# Patient Record
Sex: Male | Born: 1960 | Race: Black or African American | Hispanic: No | Marital: Married | Smoking: Never smoker
Health system: Southern US, Community
[De-identification: ages and names within clinical notes are randomized; demographics above are authoritative.]

## PROBLEM LIST (undated history)

## (undated) DIAGNOSIS — Z86711 Personal history of pulmonary embolism: Secondary | ICD-10-CM

## (undated) DIAGNOSIS — I471 Supraventricular tachycardia, unspecified: Secondary | ICD-10-CM

## (undated) DIAGNOSIS — Z8709 Personal history of other diseases of the respiratory system: Secondary | ICD-10-CM

## (undated) DIAGNOSIS — Z86718 Personal history of other venous thrombosis and embolism: Secondary | ICD-10-CM

## (undated) DIAGNOSIS — R000 Tachycardia, unspecified: Secondary | ICD-10-CM

## (undated) DIAGNOSIS — E785 Hyperlipidemia, unspecified: Secondary | ICD-10-CM

## (undated) DIAGNOSIS — I10 Essential (primary) hypertension: Secondary | ICD-10-CM

## (undated) DIAGNOSIS — N401 Enlarged prostate with lower urinary tract symptoms: Secondary | ICD-10-CM

## (undated) DIAGNOSIS — I509 Heart failure, unspecified: Secondary | ICD-10-CM

## (undated) DIAGNOSIS — E119 Type 2 diabetes mellitus without complications: Secondary | ICD-10-CM

## (undated) DIAGNOSIS — F410 Panic disorder [episodic paroxysmal anxiety] without agoraphobia: Secondary | ICD-10-CM

## (undated) DIAGNOSIS — Z972 Presence of dental prosthetic device (complete) (partial): Secondary | ICD-10-CM

## (undated) DIAGNOSIS — C61 Malignant neoplasm of prostate: Secondary | ICD-10-CM

## (undated) HISTORY — DX: Type 2 diabetes mellitus without complications: E11.9

## (undated) HISTORY — DX: Personal history of other venous thrombosis and embolism: Z86.718

## (undated) HISTORY — PX: NO PAST SURGERIES: SHX2092

## (undated) HISTORY — DX: Essential (primary) hypertension: I10

## (undated) HISTORY — DX: Heart failure, unspecified: I50.9

## (undated) HISTORY — DX: Panic disorder (episodic paroxysmal anxiety): F41.0

## (undated) HISTORY — DX: Personal history of pulmonary embolism: Z86.711

## (undated) HISTORY — DX: Hyperlipidemia, unspecified: E78.5

---

## 2002-08-04 ENCOUNTER — Encounter: Admission: RE | Admit: 2002-08-04 | Discharge: 2002-11-02 | Payer: Self-pay | Admitting: Internal Medicine

## 2004-04-07 DIAGNOSIS — Z8679 Personal history of other diseases of the circulatory system: Secondary | ICD-10-CM

## 2004-04-07 DIAGNOSIS — Z86711 Personal history of pulmonary embolism: Secondary | ICD-10-CM

## 2004-04-07 HISTORY — DX: Personal history of pulmonary embolism: Z86.711

## 2004-04-07 HISTORY — DX: Personal history of other diseases of the circulatory system: Z86.79

## 2004-04-19 ENCOUNTER — Ambulatory Visit: Payer: Self-pay | Admitting: Internal Medicine

## 2004-04-21 ENCOUNTER — Encounter: Admission: RE | Admit: 2004-04-21 | Discharge: 2004-04-21 | Payer: Self-pay | Admitting: Internal Medicine

## 2004-04-21 ENCOUNTER — Emergency Department (HOSPITAL_COMMUNITY): Admission: EM | Admit: 2004-04-21 | Discharge: 2004-04-21 | Payer: Self-pay | Admitting: Emergency Medicine

## 2004-04-21 ENCOUNTER — Encounter: Payer: Self-pay | Admitting: Internal Medicine

## 2004-04-22 ENCOUNTER — Encounter: Admission: RE | Admit: 2004-04-22 | Discharge: 2004-04-22 | Payer: Self-pay | Admitting: Family Medicine

## 2004-04-22 ENCOUNTER — Ambulatory Visit: Payer: Self-pay | Admitting: Family Medicine

## 2004-04-23 ENCOUNTER — Ambulatory Visit: Payer: Self-pay | Admitting: Family Medicine

## 2004-04-25 ENCOUNTER — Ambulatory Visit: Payer: Self-pay | Admitting: Internal Medicine

## 2004-04-28 ENCOUNTER — Ambulatory Visit: Payer: Self-pay | Admitting: Internal Medicine

## 2004-05-06 ENCOUNTER — Ambulatory Visit: Payer: Self-pay | Admitting: Internal Medicine

## 2004-06-08 ENCOUNTER — Ambulatory Visit: Payer: Self-pay | Admitting: Internal Medicine

## 2004-06-29 ENCOUNTER — Ambulatory Visit: Payer: Self-pay

## 2004-06-29 ENCOUNTER — Encounter: Payer: Self-pay | Admitting: Internal Medicine

## 2004-07-06 ENCOUNTER — Ambulatory Visit: Payer: Self-pay | Admitting: Internal Medicine

## 2004-07-14 ENCOUNTER — Encounter: Payer: Self-pay | Admitting: Internal Medicine

## 2004-07-14 ENCOUNTER — Ambulatory Visit: Payer: Self-pay | Admitting: Cardiology

## 2004-07-27 ENCOUNTER — Ambulatory Visit: Payer: Self-pay

## 2004-08-03 ENCOUNTER — Encounter: Payer: Self-pay | Admitting: Internal Medicine

## 2004-08-03 ENCOUNTER — Ambulatory Visit: Payer: Self-pay | Admitting: Cardiology

## 2004-10-20 ENCOUNTER — Ambulatory Visit: Payer: Self-pay | Admitting: Internal Medicine

## 2005-04-06 ENCOUNTER — Ambulatory Visit: Payer: Self-pay | Admitting: Internal Medicine

## 2005-07-12 ENCOUNTER — Ambulatory Visit: Payer: Self-pay | Admitting: Internal Medicine

## 2005-07-27 ENCOUNTER — Encounter: Payer: Self-pay | Admitting: Internal Medicine

## 2005-07-27 ENCOUNTER — Ambulatory Visit: Payer: Self-pay

## 2005-07-27 ENCOUNTER — Encounter: Payer: Self-pay | Admitting: Cardiology

## 2005-09-07 ENCOUNTER — Ambulatory Visit: Payer: Self-pay | Admitting: Internal Medicine

## 2005-10-12 ENCOUNTER — Ambulatory Visit: Payer: Self-pay | Admitting: Internal Medicine

## 2006-02-12 ENCOUNTER — Ambulatory Visit: Payer: Self-pay | Admitting: Internal Medicine

## 2006-03-20 IMAGING — CT CT EXTREM LOW BILAT W/ CM
1 series · 16 of 32 positions shown, 20 images · IV contrast (omnipaque)
Comparison: none

CLINICAL DATA: Recent pulmonary emboli.
CT LOWER EXTREMITIES WITH CONTRAST ? DVT PROTOCOL ? 04/22/04: 
Scans of the pelvis and lower extremities were obtained with lower extremity DVT protocol following IV injection of 120 cc of Omnipaque 300 and delayed scanning.
There is relatively faint opacification of the venous system. However, there is no CT scan evidence for deep venous thrombosis.  The soft tissues have a symmetrical and normal appearance.  There is no evidence for a pelvic mass or adenopathy and scans of the abdomen included with the study demonstrate no retroperitoneal adenopathy or retroperitoneal mass.

[Series 2: recon 2 pulmonary arteries · axial · 0.73mm/px · z∈[-1162,-42]mm · 16 of 111 slices shown, 20 images]
[im 5/111  mediastinal]
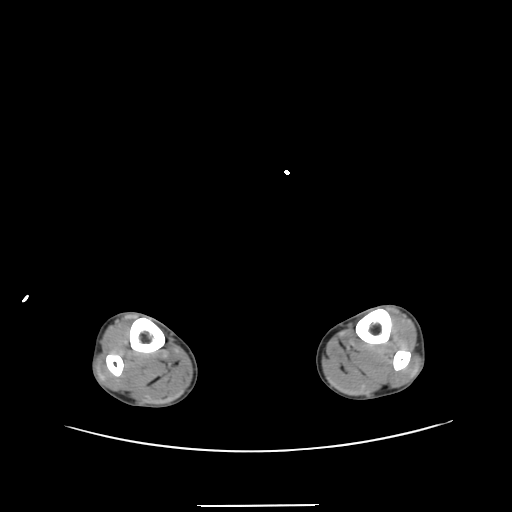
[im 5/111  lung]
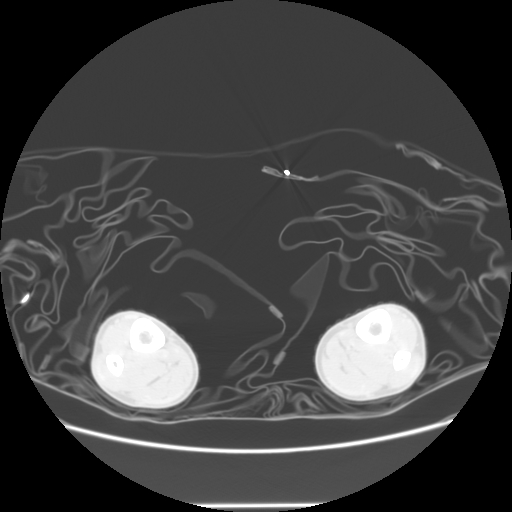
[im 13/111  lung]
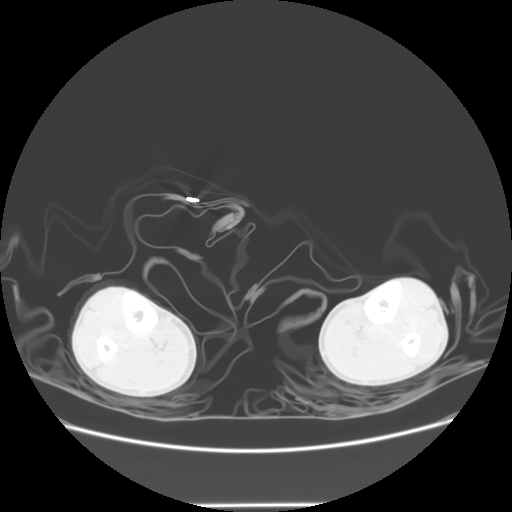
[im 21/111  lung]
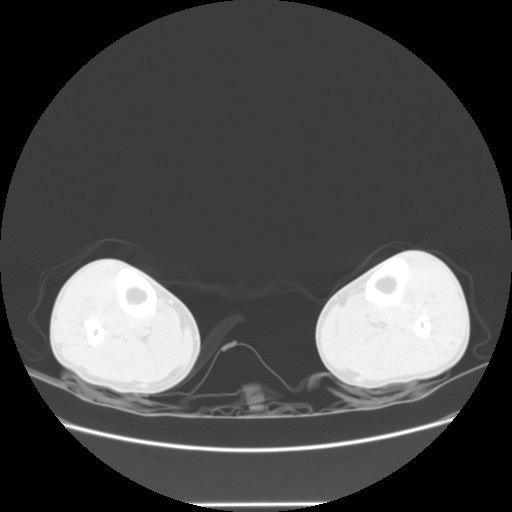
[im 25/111  lung]
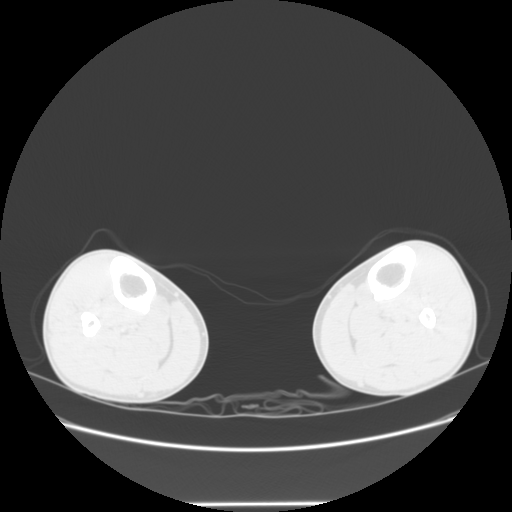
[im 33/111  mediastinal]
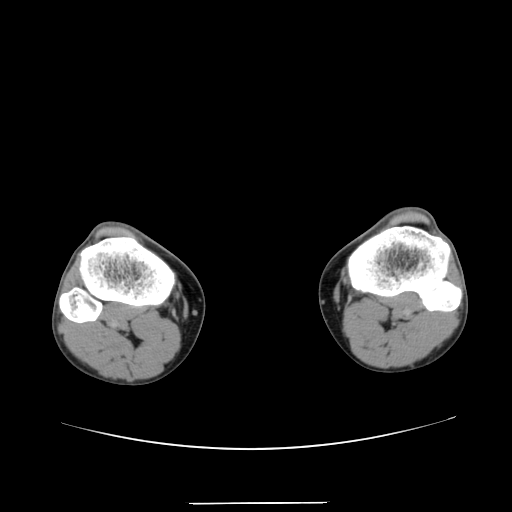
[im 33/111  lung]
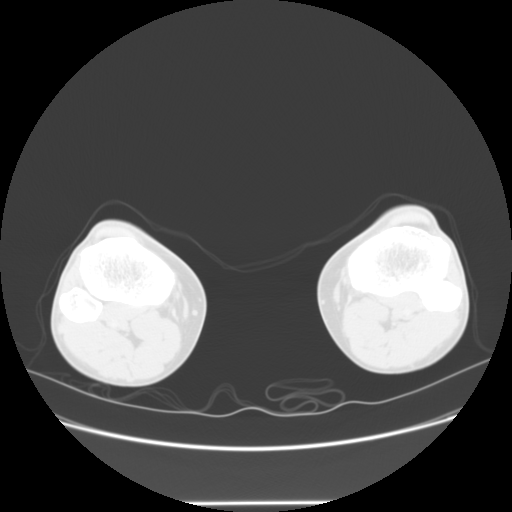
[im 41/111  lung]
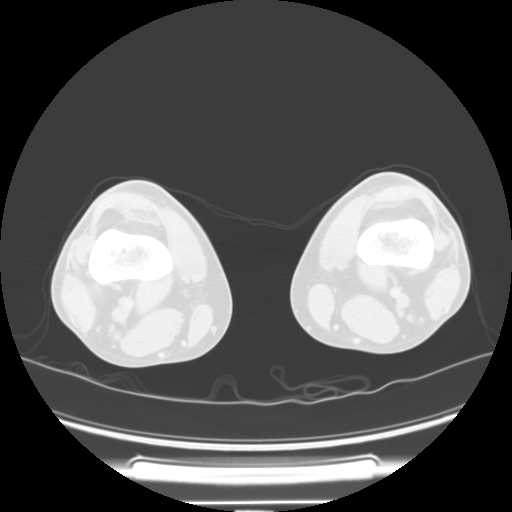
[im 49/111  lung]
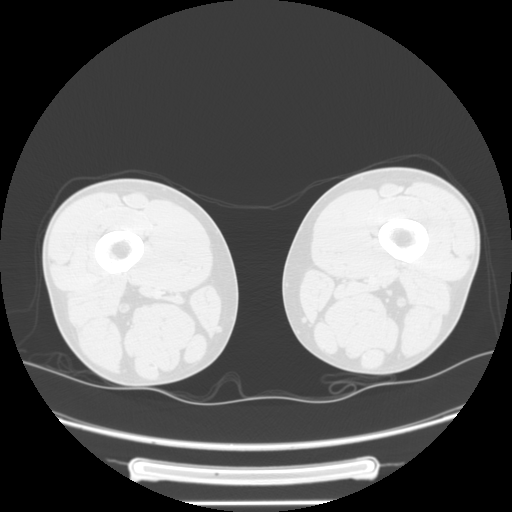
[im 58/111  lung]
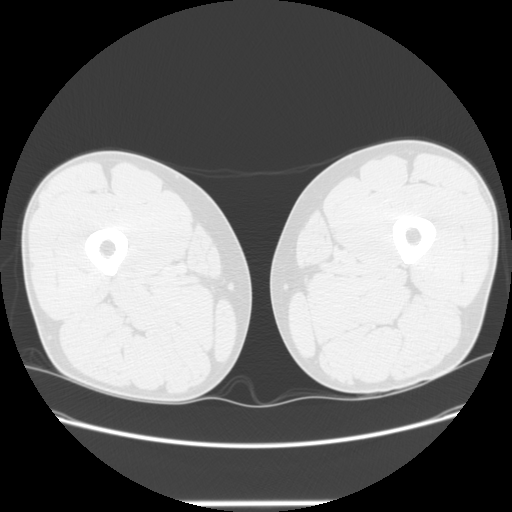
[im 61/111  mediastinal]
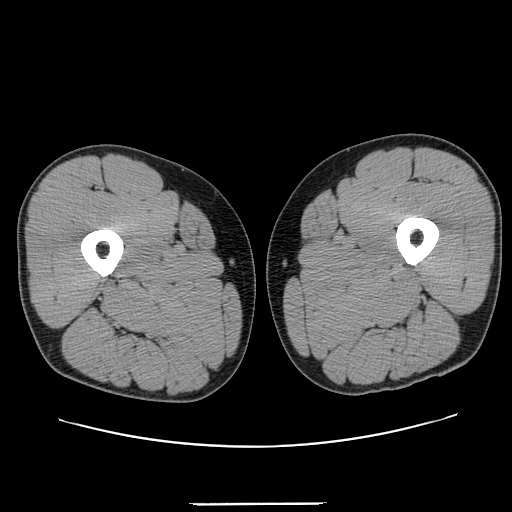
[im 61/111  lung]
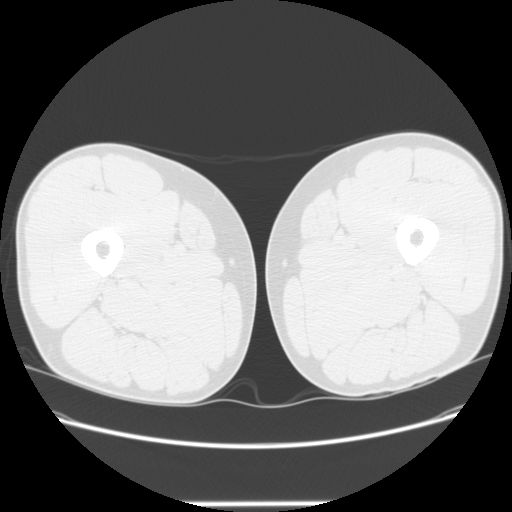
[im 66/111  lung]
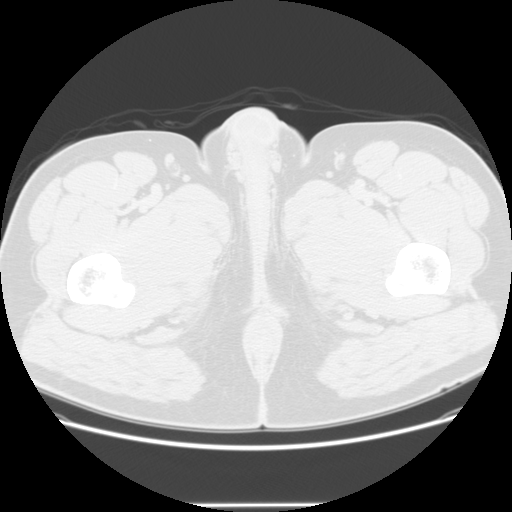
[im 70/111  lung]
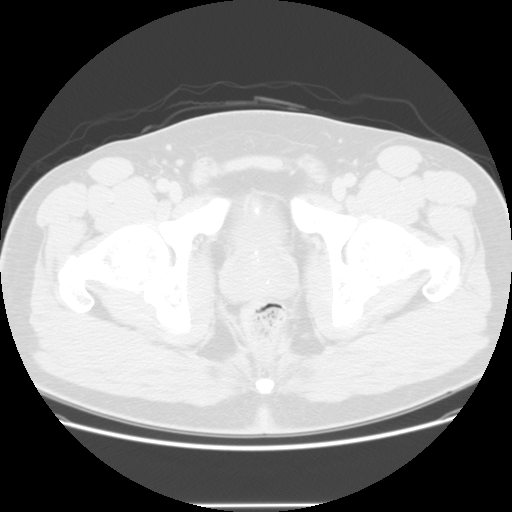
[im 78/111  lung]
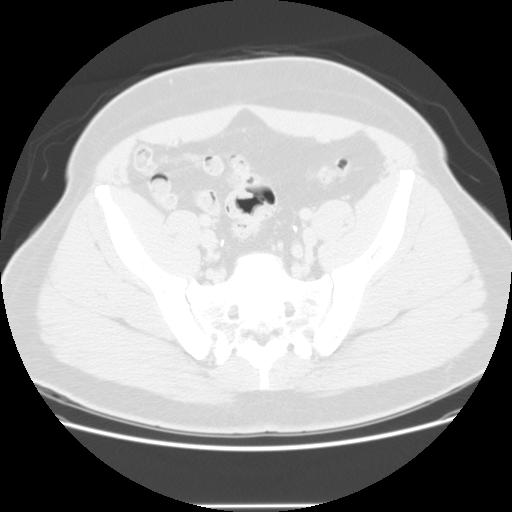
[im 86/111  mediastinal]
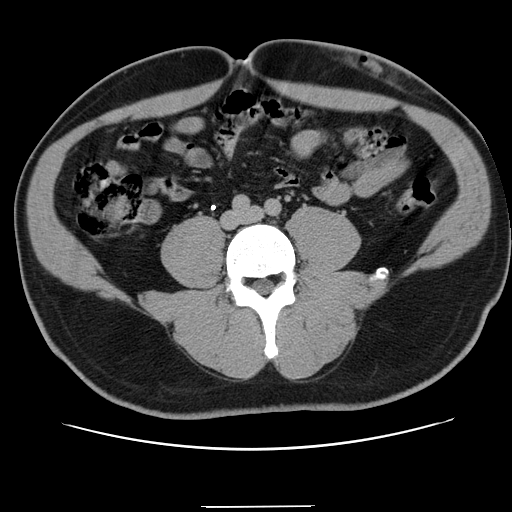
[im 86/111  lung]
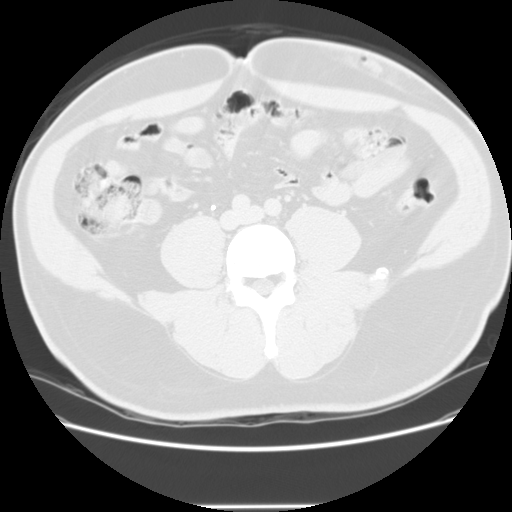
[im 90/111  lung]
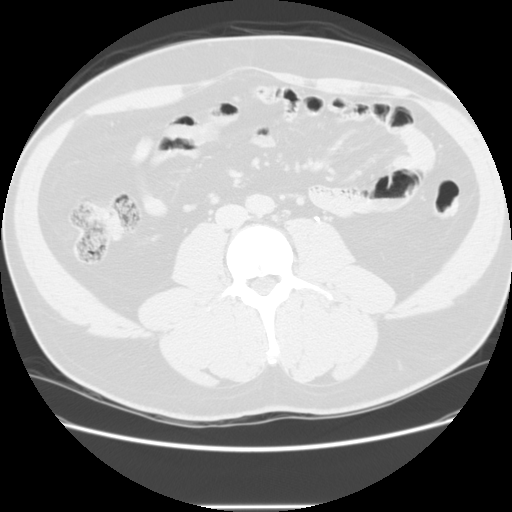
[im 98/111  lung]
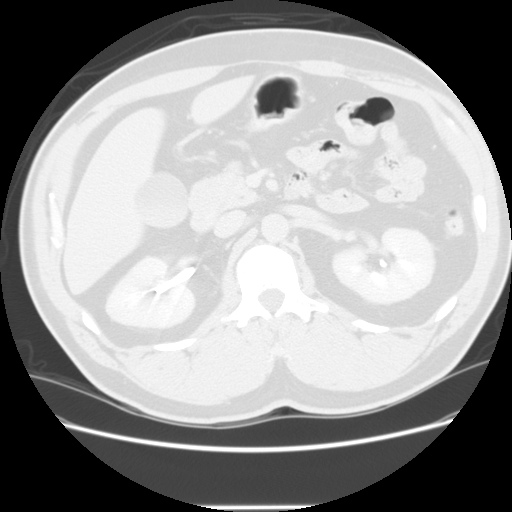
[im 106/111  lung]
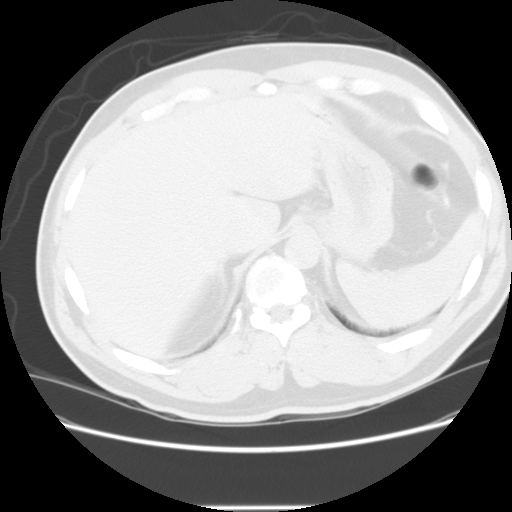

[16 of 32 positions shown; findings below may reference images not displayed]

IMPRESSION: Negative study for deep venous thrombosis.

## 2006-08-06 ENCOUNTER — Ambulatory Visit: Payer: Self-pay | Admitting: Internal Medicine

## 2006-08-06 LAB — CONVERTED CEMR LAB
BUN: 11 mg/dL (ref 6–23)
CO2: 28 meq/L (ref 19–32)
Calcium: 8.8 mg/dL (ref 8.4–10.5)
Chloride: 101 meq/L (ref 96–112)
Creatinine, Ser: 1 mg/dL (ref 0.4–1.5)
GFR calc Af Amer: 103 mL/min
GFR calc non Af Amer: 85 mL/min
Glucose, Bld: 200 mg/dL — ABNORMAL HIGH (ref 70–99)
Hgb A1c MFr Bld: 7.5 % — ABNORMAL HIGH (ref 4.6–6.0)
Potassium: 4.3 meq/L (ref 3.5–5.1)
Sodium: 135 meq/L (ref 135–145)

## 2006-11-19 ENCOUNTER — Encounter: Payer: Self-pay | Admitting: Internal Medicine

## 2006-12-04 ENCOUNTER — Ambulatory Visit: Payer: Self-pay | Admitting: Internal Medicine

## 2006-12-04 DIAGNOSIS — I110 Hypertensive heart disease with heart failure: Secondary | ICD-10-CM | POA: Insufficient documentation

## 2006-12-04 DIAGNOSIS — I509 Heart failure, unspecified: Secondary | ICD-10-CM

## 2006-12-04 DIAGNOSIS — E785 Hyperlipidemia, unspecified: Secondary | ICD-10-CM

## 2006-12-04 DIAGNOSIS — I10 Essential (primary) hypertension: Secondary | ICD-10-CM

## 2006-12-04 DIAGNOSIS — F410 Panic disorder [episodic paroxysmal anxiety] without agoraphobia: Secondary | ICD-10-CM

## 2006-12-04 DIAGNOSIS — E119 Type 2 diabetes mellitus without complications: Secondary | ICD-10-CM | POA: Insufficient documentation

## 2006-12-04 DIAGNOSIS — Z86718 Personal history of other venous thrombosis and embolism: Secondary | ICD-10-CM

## 2006-12-04 DIAGNOSIS — I2699 Other pulmonary embolism without acute cor pulmonale: Secondary | ICD-10-CM

## 2006-12-04 HISTORY — DX: Essential (primary) hypertension: I10

## 2006-12-04 HISTORY — DX: Panic disorder (episodic paroxysmal anxiety): F41.0

## 2006-12-04 HISTORY — DX: Type 2 diabetes mellitus without complications: E11.9

## 2006-12-04 HISTORY — DX: Personal history of other venous thrombosis and embolism: Z86.718

## 2006-12-04 HISTORY — DX: Hyperlipidemia, unspecified: E78.5

## 2006-12-04 HISTORY — DX: Heart failure, unspecified: I50.9

## 2006-12-04 LAB — CONVERTED CEMR LAB: Blood Glucose, Fingerstick: 265

## 2007-01-08 ENCOUNTER — Encounter: Payer: Self-pay | Admitting: Internal Medicine

## 2007-01-10 ENCOUNTER — Ambulatory Visit: Payer: Self-pay | Admitting: Internal Medicine

## 2007-01-13 LAB — CONVERTED CEMR LAB
ALT: 37 units/L (ref 0–53)
AST: 23 units/L (ref 0–37)
BUN: 7 mg/dL (ref 6–23)
CO2: 31 meq/L (ref 19–32)
Calcium: 9.7 mg/dL (ref 8.4–10.5)
Chloride: 100 meq/L (ref 96–112)
Cholesterol: 118 mg/dL (ref 0–200)
Creatinine, Ser: 0.8 mg/dL (ref 0.4–1.5)
Creatinine,U: 280.7 mg/dL
GFR calc Af Amer: 133 mL/min
GFR calc non Af Amer: 110 mL/min
Glucose, Bld: 191 mg/dL — ABNORMAL HIGH (ref 70–99)
HDL: 31.9 mg/dL — ABNORMAL LOW (ref >39.0)
Hgb A1c MFr Bld: 6.8 % — ABNORMAL HIGH (ref 4.6–6.0)
LDL Cholesterol: 61 mg/dL (ref 0–99)
Microalb Creat Ratio: 13.5 mg/g (ref 0.0–30.0)
Microalb, Ur: 3.8 mg/dL — ABNORMAL HIGH (ref 0.0–1.9)
Potassium: 3.9 meq/L (ref 3.5–5.1)
Sodium: 138 meq/L (ref 135–145)
TSH: 1.21 microintl units/mL (ref 0.35–5.50)
Total CHOL/HDL Ratio: 3.7
Triglycerides: 124 mg/dL (ref 0–149)
VLDL: 25 mg/dL (ref 0–40)

## 2007-01-15 ENCOUNTER — Encounter: Payer: Self-pay | Admitting: Internal Medicine

## 2007-01-21 ENCOUNTER — Ambulatory Visit: Payer: Self-pay | Admitting: Internal Medicine

## 2007-01-21 DIAGNOSIS — J329 Chronic sinusitis, unspecified: Secondary | ICD-10-CM | POA: Insufficient documentation

## 2007-04-22 ENCOUNTER — Encounter: Payer: Self-pay | Admitting: Internal Medicine

## 2007-05-09 LAB — HM DIABETES EYE EXAM: HM Diabetic Eye Exam: NORMAL

## 2007-06-10 ENCOUNTER — Encounter: Payer: Self-pay | Admitting: Internal Medicine

## 2007-07-30 ENCOUNTER — Ambulatory Visit: Payer: Self-pay | Admitting: Internal Medicine

## 2007-07-30 DIAGNOSIS — J019 Acute sinusitis, unspecified: Secondary | ICD-10-CM

## 2007-07-30 LAB — CONVERTED CEMR LAB: Blood Glucose, Fingerstick: 269

## 2007-08-28 ENCOUNTER — Telehealth: Payer: Self-pay | Admitting: *Deleted

## 2007-10-07 ENCOUNTER — Ambulatory Visit: Payer: Self-pay | Admitting: Internal Medicine

## 2007-10-17 ENCOUNTER — Ambulatory Visit: Payer: Self-pay | Admitting: Internal Medicine

## 2007-10-21 ENCOUNTER — Telehealth: Payer: Self-pay | Admitting: *Deleted

## 2007-10-21 LAB — CONVERTED CEMR LAB
ALT: 39 units/L (ref 0–53)
AST: 28 units/L (ref 0–37)
Albumin: 4.1 g/dL (ref 3.5–5.2)
Alkaline Phosphatase: 66 units/L (ref 39–117)
BUN: 10 mg/dL (ref 6–23)
Basophils Absolute: 0 10*3/uL (ref 0.0–0.1)
Basophils Relative: 0.3 % (ref 0.0–1.0)
Bilirubin, Direct: 0.1 mg/dL (ref 0.0–0.3)
CO2: 27 meq/L (ref 19–32)
Calcium: 9.4 mg/dL (ref 8.4–10.5)
Chloride: 99 meq/L (ref 96–112)
Cholesterol: 121 mg/dL (ref 0–200)
Creatinine, Ser: 1 mg/dL (ref 0.4–1.5)
Creatinine,U: 187.7 mg/dL
Eosinophils Absolute: 0.1 10*3/uL (ref 0.0–0.7)
Eosinophils Relative: 1.6 % (ref 0.0–5.0)
GFR calc Af Amer: 103 mL/min
GFR calc non Af Amer: 85 mL/min
Glucose, Bld: 187 mg/dL — ABNORMAL HIGH (ref 70–99)
HCT: 41.8 % (ref 39.0–52.0)
HDL: 28.2 mg/dL — ABNORMAL LOW (ref >39.0)
Hemoglobin: 15 g/dL (ref 13.0–17.0)
Hgb A1c MFr Bld: 7.5 % — ABNORMAL HIGH (ref 4.6–6.0)
LDL Cholesterol: 76 mg/dL (ref 0–99)
Lymphocytes Relative: 38.8 % (ref 12.0–46.0)
MCHC: 35.8 g/dL (ref 30.0–36.0)
MCV: 94 fL (ref 78.0–100.0)
Microalb Creat Ratio: 8.5 mg/g (ref 0.0–30.0)
Microalb, Ur: 1.6 mg/dL (ref 0.0–1.9)
Monocytes Absolute: 0.5 10*3/uL (ref 0.1–1.0)
Monocytes Relative: 7.6 % (ref 3.0–12.0)
Neutro Abs: 3.1 10*3/uL (ref 1.4–7.7)
Neutrophils Relative %: 51.7 % (ref 43.0–77.0)
Platelets: 261 10*3/uL (ref 150–400)
Potassium: 3.7 meq/L (ref 3.5–5.1)
RBC: 4.44 M/uL (ref 4.22–5.81)
RDW: 11.4 % — ABNORMAL LOW (ref 11.5–14.6)
Sodium: 136 meq/L (ref 135–145)
TSH: 1.11 microintl units/mL (ref 0.35–5.50)
Total Bilirubin: 0.8 mg/dL (ref 0.3–1.2)
Total CHOL/HDL Ratio: 4.3
Total Protein: 7.3 g/dL (ref 6.0–8.3)
Triglycerides: 86 mg/dL (ref 0–149)
VLDL: 17 mg/dL (ref 0–40)
WBC: 6.1 10*3/uL (ref 4.5–10.5)

## 2008-05-06 ENCOUNTER — Ambulatory Visit: Payer: Self-pay | Admitting: Internal Medicine

## 2008-05-06 LAB — CONVERTED CEMR LAB
BUN: 14 mg/dL (ref 6–23)
CO2: 30 meq/L (ref 19–32)
Calcium: 9.2 mg/dL (ref 8.4–10.5)
Chloride: 100 meq/L (ref 96–112)
Creatinine, Ser: 1 mg/dL (ref 0.4–1.5)
GFR calc Af Amer: 103 mL/min
GFR calc non Af Amer: 85 mL/min
Glucose, Bld: 256 mg/dL — ABNORMAL HIGH (ref 70–99)
Hgb A1c MFr Bld: 7.3 % — ABNORMAL HIGH (ref 4.6–6.0)
Potassium: 4.1 meq/L (ref 3.5–5.1)
Sodium: 135 meq/L (ref 135–145)

## 2008-05-21 ENCOUNTER — Ambulatory Visit: Payer: Self-pay | Admitting: Internal Medicine

## 2008-05-21 LAB — CONVERTED CEMR LAB: Blood Glucose, Fingerstick: 238

## 2008-06-08 ENCOUNTER — Telehealth: Payer: Self-pay | Admitting: Internal Medicine

## 2008-10-28 ENCOUNTER — Encounter: Payer: Self-pay | Admitting: *Deleted

## 2009-01-18 ENCOUNTER — Ambulatory Visit: Payer: Self-pay | Admitting: Internal Medicine

## 2009-01-18 LAB — CONVERTED CEMR LAB: Blood Glucose, Fingerstick: 154

## 2009-09-27 ENCOUNTER — Ambulatory Visit: Payer: Self-pay | Admitting: Internal Medicine

## 2009-09-27 LAB — CONVERTED CEMR LAB
ALT: 31 units/L (ref 0–53)
AST: 27 units/L (ref 0–37)
Albumin: 4 g/dL (ref 3.5–5.2)
Alkaline Phosphatase: 55 units/L (ref 39–117)
BUN: 12 mg/dL (ref 6–23)
Bilirubin, Direct: 0.1 mg/dL (ref 0.0–0.3)
CO2: 30 meq/L (ref 19–32)
Calcium: 9.2 mg/dL (ref 8.4–10.5)
Chloride: 98 meq/L (ref 96–112)
Cholesterol: 215 mg/dL — ABNORMAL HIGH (ref 0–200)
Creatinine, Ser: 0.9 mg/dL (ref 0.4–1.5)
Creatinine,U: 199.4 mg/dL
Direct LDL: 169.9 mg/dL
GFR calc non Af Amer: 111.9 mL/min (ref >60)
Glucose, Bld: 185 mg/dL — ABNORMAL HIGH (ref 70–99)
HDL: 37.1 mg/dL — ABNORMAL LOW (ref >39.00)
Hgb A1c MFr Bld: 6.8 % — ABNORMAL HIGH (ref 4.6–6.5)
Microalb Creat Ratio: 0.8 mg/g (ref 0.0–30.0)
Microalb, Ur: 1.6 mg/dL (ref 0.0–1.9)
Potassium: 4 meq/L (ref 3.5–5.1)
Sodium: 138 meq/L (ref 135–145)
Total Bilirubin: 0.9 mg/dL (ref 0.3–1.2)
Total CHOL/HDL Ratio: 6
Total Protein: 6.9 g/dL (ref 6.0–8.3)
Triglycerides: 132 mg/dL (ref 0.0–149.0)
VLDL: 26.4 mg/dL (ref 0.0–40.0)

## 2009-10-18 ENCOUNTER — Telehealth: Payer: Self-pay | Admitting: *Deleted

## 2009-10-28 ENCOUNTER — Ambulatory Visit: Payer: Self-pay | Admitting: Internal Medicine

## 2009-10-28 DIAGNOSIS — T50995A Adverse effect of other drugs, medicaments and biological substances, initial encounter: Secondary | ICD-10-CM

## 2010-01-20 ENCOUNTER — Telehealth: Payer: Self-pay | Admitting: Internal Medicine

## 2010-02-08 ENCOUNTER — Telehealth: Payer: Self-pay | Admitting: *Deleted

## 2010-04-04 ENCOUNTER — Telehealth: Payer: Self-pay | Admitting: *Deleted

## 2010-04-11 ENCOUNTER — Telehealth: Payer: Self-pay | Admitting: Internal Medicine

## 2010-04-21 ENCOUNTER — Encounter: Payer: Self-pay | Admitting: *Deleted

## 2010-05-28 ENCOUNTER — Encounter: Payer: Self-pay | Admitting: Family Medicine

## 2010-06-03 ENCOUNTER — Encounter: Payer: Self-pay | Admitting: Internal Medicine

## 2010-06-07 NOTE — Assessment & Plan Note (Signed)
Summary: follow up on labs/ssc/pt rescd//ccm   Vital Signs:  Patient profile:   50 year old male Height:      73 inches Weight:      230 pounds BMI:     30.45 Pulse rate:   60 / minute BP sitting:   100 / 70  (right arm) Cuff size:   large  Vitals Entered By: Romualdo Bolk, CMA (AAMA) (October 28, 2009 10:53 AM)   Serial Vital Signs/Assessments:  Time      Position  BP       Pulse  Resp  Temp     By                     120/80                         Madelin Headings MD  Comments: right arm sitting By: Madelin Headings MD   CC: follow-up visit on labs, Hypertension Management   History of Present Illness: Bobby Obrien comes in today  for  follow up of multiple medical problems .  He is doing well  and has no new co.   BG and BP usually good .  and taking meds . No hypoglycemia.  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  Since last visit  here  there have been no major changes in health status   . Last eye check 2 years ago .Never had colonsocpy. No cp sob or edema or numbness.     Hypertension History:      He denies headache, chest pain, palpitations, dyspnea with exertion, orthopnea, PND, peripheral edema, visual symptoms, neurologic problems, syncope, and side effects from treatment.  He notes no problems with any antihypertensive medication side effects.        Positive major cardiovascular risk factors include male age 30 years old or older, diabetes, hyperlipidemia, and hypertension.  Negative major cardiovascular risk factors include non-tobacco-user status.        Positive history for target organ damage include cardiac end organ damage (either CHF or LVH).      Preventive Screening-Counseling & Management  Alcohol-Tobacco     Alcohol drinks/day: <1     Alcohol type: beer, mixed drinks     Smoking Status: never  Caffeine-Diet-Exercise     Does  Patient Exercise: yes     Type of exercise: walking     Times/week: 2  Current Medications (verified): 1)  Coreg 12.5 Mg  Tabs (Carvedilol) .Marland Kitchen.. 1 Two Times A Day 2)  Lisinopril-Hydrochlorothiazide 20-25 Mg  Tabs (Lisinopril-Hydrochlorothiazide) .Marland Kitchen.. 1 By Mouth Once Daily 3)  Amaryl 2 Mg  Tabs (Glimepiride) .... 1/2 - 1 By Mouth Once Daily 4)  Simvastatin 40 Mg  Tabs (Simvastatin) .Marland Kitchen.. 1 By Mouth Q D 5)  Baby Aspirin 81 Mg  Chew (Aspirin) 6)  Metformin Hcl 500 Mg  Tb24 (Metformin Hcl) .... Take 4 By Mouth Once Daily  Allergies (verified): 1)  Lipitor 2)  Simvastatin  Past History:  Past medical, surgical, family and social histories (including risk factors) reviewed, and no changes noted (except as noted below).  Past Medical History: Reviewed history from 05/21/2008 and no changes required. Diabetes mellitus, type II Hyperlipidemia Hypertension Congestive heart failure   2005   hx dec lv function  ech o 3/07 50-55% EF DVT, hx of Pulmonary embolism, hx of 12/05    Consults None   Past History:  Care Management: None Current  Family History: Reviewed history from 05/21/2008 and no changes required. FA  MI age 1 died Family History Hypertension mom  ? neg DM   Social History: Reviewed history from 01/18/2009 and no changes required. Married not smoking working  3 hours hs musician   Alcohol use-yes ocassional    Sleep    6-7 hours    currently no insurance   Review of Systems  The patient denies anorexia, fever, weight loss, weight gain, vision loss, decreased hearing, hoarseness, chest pain, syncope, dyspnea on exertion, peripheral edema, prolonged cough, abdominal pain, melena, hematochezia, severe indigestion/heartburn, hematuria, suspicious skin lesions, transient blindness, difficulty walking, depression, unusual weight change, abnormal bleeding, enlarged lymph nodes, and angioedema.    Physical Exam  General:  Well-developed,well-nourished,in no acute  distress; alert,appropriate and cooperative throughout examination Head:  normocephalic and atraumatic.   Eyes:  vision grossly intact, pupils equal, and pupils round.   Ears:  R ear normal, L ear normal, and no external deformities.   Nose:  no external deformity, no external erythema, and no nasal discharge.   Mouth:  pharynx pink and moist.   Neck:  No deformities, masses, or tenderness noted. Lungs:  Normal respiratory effort, chest expands symmetrically. Lungs are clear to auscultation, no crackles or wheezes.no dullness.   Heart:  Normal rate and regular rhythm. S1 and S2 normal without gallop, murmur, click, rub or other extra sounds. Abdomen:  Bowel sounds positive,abdomen soft and non-tender without masses, organomegaly or   noted. Msk:  no joint swelling and no joint warmth.   Pulses:  pulses intact without delay   Extremities:  no clubbing cyanosis or edema  Neurologic:  alert & oriented X3, strength normal in all extremities, sensation intact to light touch, and gait normal.   Skin:  no ecchymoses, no petechiae, and no purpura.   Cervical Nodes:  No lymphadenopathy noted Psych:  Oriented X3, memory intact for recent and remote, normally interactive, good eye contact, not anxious appearing, and not depressed appearing.    Diabetes Management Exam:    Foot Exam (with socks and/or shoes not present):       Sensory-Monofilament:          Left foot: normal          Right foot: normal       Inspection:          Left foot: normal          Right foot: normal       Nails:          Left foot: normal          Right foot: normal    Eye Exam:       Eye Exam done elsewhere          Date: 05/09/2007          Results: normal          Done by: Posey Rea of name   Impression & Recommendations:  Problem # 1:  DIABETES MELLITUS, TYPE II (ICD-250.00) Assessment Unchanged  in control   His updated medication list for this problem includes:    Lisinopril-hydrochlorothiazide 20-25 Mg  Tabs (Lisinopril-hydrochlorothiazide) .Marland Kitchen... 1 by mouth once daily    Amaryl 2 Mg Tabs (Glimepiride) .Marland Kitchen... 1/2 - 1 by mouth once daily    Baby Aspirin 81 Mg  Chew (Aspirin)    Metformin Hcl 500 Mg Tb24 (Metformin hcl) .Marland Kitchen... Take 4 by mouth once daily  Labs Reviewed: Creat: 0.9 (09/27/2009)     Last Eye Exam: normal (05/09/2007) Reviewed HgBA1c results: 6.8 (09/27/2009)  7.3 (05/06/2008)  Problem # 2:  HYPERTENSION (ICD-401.9) declined ekg today controlled  His updated medication list for this problem includes:    Coreg 12.5 Mg Tabs (Carvedilol) .Marland Kitchen... 1 two times a day    Lisinopril-hydrochlorothiazide 20-25 Mg Tabs (Lisinopril-hydrochlorothiazide) .Marland Kitchen... 1 by mouth once daily  BP today: 100/70 Prior BP: 108/74 (01/18/2009)  10 Yr Risk Heart Disease: 7 %  Labs Reviewed: K+: 4.0 (09/27/2009) Creat: : 0.9 (09/27/2009)   Chol: 215 (09/27/2009)   HDL: 37.10 (09/27/2009)   LDL: 76 (10/17/2007)   TG: 132.0 (09/27/2009)  Problem # 3:  hx of CM last echo 2007  declines stress test and declined cath at the time because of risk possibilties .  Will  be willing to repeat an echo in the next year or 2 but has no cv signs or intolerance at present.   Problem # 4:  PULMONARY EMBOLISM, HX OF (ICD-V12.51)  His updated medication list for this problem includes:    Baby Aspirin 81 Mg Chew (Aspirin)  Problem # 5:  HYPERLIPIDEMIA (ICD-272.4) Assessment: Deteriorated samples  given  not a t goal had se of meds  willing to try crestor as tolerated   The following medications were removed from the medication list:    Simvastatin 40 Mg Tabs (Simvastatin) .Marland Kitchen... 1 by mouth q d His updated medication list for this problem includes:    Crestor 5 Mg Tabs (Rosuvastatin calcium) .Marland Kitchen... 1 by mouth once daily  for cholesterol  Labs Reviewed: SGOT: 27 (09/27/2009)   SGPT: 31 (09/27/2009)  10 Yr Risk Heart Disease: 7 %   HDL:37.10 (09/27/2009), 28.2 (10/17/2007)  LDL:76 (10/17/2007), 61 (01/10/2007)   Chol:215 (09/27/2009), 121 (10/17/2007)  Trig:132.0 (09/27/2009), 86 (10/17/2007)  Problem # 6:  ADVERSE REACTION TO MEDICATION (ICD-995.29) cramps with simvastatin and lipitor   will try low dose crestor  Problem # 7:  Preventive Health Care (ICD-V70.0) needed colonscopy  disc this  he will call for this   Complete Medication List: 1)  Coreg 12.5 Mg Tabs (Carvedilol) .Marland Kitchen.. 1 two times a day 2)  Lisinopril-hydrochlorothiazide 20-25 Mg Tabs (Lisinopril-hydrochlorothiazide) .Marland Kitchen.. 1 by mouth once daily 3)  Amaryl 2 Mg Tabs (Glimepiride) .... 1/2 - 1 by mouth once daily 4)  Baby Aspirin 81 Mg Chew (Aspirin) 5)  Metformin Hcl 500 Mg Tb24 (Metformin hcl) .... Take 4 by mouth once daily 6)  Crestor 5 Mg Tabs (Rosuvastatin calcium) .Marland Kitchen.. 1 by mouth once daily  for cholesterol  Hypertension Assessment/Plan:      The patient's hypertensive risk group is category C: Target organ damage and/or diabetes.  His calculated 10 year risk of coronary heart disease is 7 %.  Today's blood pressure is 100/70.  His blood pressure goal is < 130/85.  Patient Instructions: 1)  need eye exam 2)  continue medication 3)  begin trial of low dose crestor  to help your  lipids and  help prevent heart attack and strokes. 4)  you are due for ECHO test of heart   . 5)  You are due for colonscopy   call when you want a referral set up. 6)  lipids lfts in 2 months  then plan follow up.    appt.

## 2010-06-07 NOTE — Progress Notes (Signed)
Summary: needs info  Phone Note Call from Patient Call back at Home Phone [phone]   Caller: Patient---live call Summary of Call: need A1c levels, and whenhe was dx'd with type 2 dm----for his new ins. Initial call taken by: Warnell Forester,  February 08, 2010 1:52 PM  Follow-up for Phone Call        HgA1c- 09/27/09 [H]  6.8 %  Pt aware of this. His est date of dx was back in 2005.  Pt is okay with this. Follow-up by: Romualdo Bolk, CMA (AAMA),  February 08, 2010 2:19 PM

## 2010-06-07 NOTE — Progress Notes (Signed)
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    Initial call taken by: Debbra Riding,  October 18, 2009 10:12 AM  Follow-up for Phone Call        already done Follow-up by: Romualdo Bolk, CMA Duncan Dull),  October 18, 2009 10:39 AM

## 2010-06-07 NOTE — Progress Notes (Signed)
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    Initial call taken by: Debbra Riding,  January 20, 2010 3:41 PM  Follow-up for Phone Call        Rx faxed to pharmacy Follow-up by: Mervin Hack CMA Duncan Dull),  January 21, 2010 9:20 AM    Prescriptions: AMARYL 2 MG  TABS (GLIMEPIRIDE) 1/2 - 1 by mouth once daily  #30 Each x 1   Entered by:   Mervin Hack CMA (AAMA)   Authorized by:   Madelin Headings MD   Signed by:   Mervin Hack CMA (AAMA) on 01/21/2010   Method used:   Electronically to        [facility] [address] [place] (retail)       [phone]       [place], [place]  [address]       Ph: [phone]       Fax: [phone]   RxID:   [id number] LISINOPRIL-HYDROCHLOROTHIAZIDE 20-25 MG  TABS (LISINOPRIL-HYDROCHLOROTHIAZIDE) 1 by mouth once daily  #30 Each x 1   Entered by:   Mervin Hack CMA (AAMA)   Authorized by:   Madelin Headings MD   Signed by:   Mervin Hack CMA (AAMA) on 01/21/2010   Method used:   Electronically to        [facility] [address] [place] (retail)       [phone]       [place], [place]  [address]       Ph: [phone]       Fax: [phone]   RxID:   [id number]

## 2010-06-07 NOTE — Progress Notes (Signed)
Summary: refill  Phone Note From Pharmacy   Caller: [facility] [address] [place] Reason for Call: Needs renewal Details for Reason: lisino-hctz 20/25 Initial call taken by: Romualdo Bolk, CMA (AAMA),  April 04, 2010 1:20 PM  Follow-up for Phone Call        Pt needs a follow up appt before next refill. Follow-up by: Romualdo Bolk, CMA (AAMA),  April 04, 2010 2:03 PM    Prescriptions: LISINOPRIL-HYDROCHLOROTHIAZIDE 20-25 MG  TABS (LISINOPRIL-HYDROCHLOROTHIAZIDE) 1 by mouth once daily  #30 Each x 0   Entered by:   Romualdo Bolk, CMA (AAMA)   Authorized by:   Madelin Headings MD   Signed by:   Romualdo Bolk, CMA (AAMA) on 04/04/2010   Method used:   Electronically to        [facility] [address] [place] (retail)       [phone]       [place], [place]  [address]       Ph: [phone]       Fax: [phone]   RxID:   [id number]

## 2010-06-09 NOTE — Progress Notes (Addendum)
Summary: refill  Phone Note From Pharmacy   Caller: [facility] [address] [place] Reason for Call: Needs renewal Details for Reason: Metformin ER 500mg  Summary of Call: Left message on machine to call back. Pt needs to schedule a follow up appt before next refill.  Romualdo Bolk, CMA (AAMA)  April 11, 2010 4:08 PM Initial call taken by: Romualdo Bolk, CMA Duncan Dull),  April 11, 2010 4:07 PM  Follow-up for Phone Call        [facility]MT[facility] Follow-up by: Romualdo Bolk, CMA Duncan Dull),  April 14, 2010 3:25 PM  Additional Follow-up for Phone Call Additional follow up Details #1::        LMT[facility] Additional Follow-up by: Romualdo Bolk, CMA Duncan Dull),  April 18, 2010 10:42 AM    Additional Follow-up for Phone Call Additional follow up Details #2::    Mailed pt a letter to call office. Follow-up by: Romualdo Bolk, CMA (AAMA),  April 21, 2010 11:22 AM  Prescriptions: METFORMIN HCL 500 MG  TB24 (METFORMIN HCL) take 4 by mouth once daily  #120 Each x 0   Entered by:   Romualdo Bolk, CMA (AAMA)   Authorized by:   Madelin Headings MD   Signed by:   Romualdo Bolk, CMA (AAMA) on 04/11/2010   Method used:   Electronically to        [facility] [address] [place] (retail)       [phone]       [place], [place]  [address]       Ph: [phone]       Fax: [phone]   RxID:   [id number]

## 2010-06-09 NOTE — Letter (Signed)
Summary: Generic Letter  [place] at [facility]  [address] [place], [place] [address]   Phone: [phone]  Fax: [phone]    04/21/2010  Cherylin Mylar [address] [place], [place]  [address]  Dear Mr. DURBIN,  We have tried to call you several times to schedule a follow up appt. To give you proper medical care, you are needed to come in for this appointment before your next refill. If you are unable to make an appointment at this time or have transferred to another office, please give [place] a call to let us know your situation.         Sincerely,   Tor Netters, CMA (AAMA)

## 2010-06-19 ENCOUNTER — Other Ambulatory Visit: Payer: Self-pay | Admitting: Internal Medicine

## 2010-06-21 ENCOUNTER — Ambulatory Visit (INDEPENDENT_AMBULATORY_CARE_PROVIDER_SITE_OTHER): Payer: BC Managed Care – PPO | Admitting: Internal Medicine

## 2010-06-21 ENCOUNTER — Encounter: Payer: Self-pay | Admitting: Internal Medicine

## 2010-06-21 DIAGNOSIS — E119 Type 2 diabetes mellitus without complications: Secondary | ICD-10-CM

## 2010-06-21 DIAGNOSIS — I10 Essential (primary) hypertension: Secondary | ICD-10-CM

## 2010-06-21 DIAGNOSIS — Z299 Encounter for prophylactic measures, unspecified: Secondary | ICD-10-CM

## 2010-06-21 DIAGNOSIS — F410 Panic disorder [episodic paroxysmal anxiety] without agoraphobia: Secondary | ICD-10-CM

## 2010-06-21 DIAGNOSIS — E785 Hyperlipidemia, unspecified: Secondary | ICD-10-CM

## 2010-06-21 LAB — HEPATIC FUNCTION PANEL
ALT: 33 U/L (ref 0–53)
AST: 24 U/L (ref 0–37)
Albumin: 4.1 g/dL (ref 3.5–5.2)
Alkaline Phosphatase: 64 U/L (ref 39–117)
Bilirubin, Direct: 0.1 mg/dL (ref 0.0–0.3)
Total Protein: 7.2 g/dL (ref 6.0–8.3)

## 2010-06-21 LAB — MICROALBUMIN / CREATININE URINE RATIO
Creatinine,U: 172 mg/dL
Microalb Creat Ratio: 1.4 mg/g (ref 0.0–30.0)
Microalb, Ur: 2.4 mg/dL — ABNORMAL HIGH (ref 0.0–1.9)

## 2010-06-21 LAB — BASIC METABOLIC PANEL
BUN: 12 mg/dL (ref 6–23)
CO2: 30 mEq/L (ref 19–32)
Calcium: 9.6 mg/dL (ref 8.4–10.5)
Chloride: 100 mEq/L (ref 96–112)
Creatinine, Ser: 0.9 mg/dL (ref 0.4–1.5)
GFR: 114.44 mL/min (ref >60.00)
Glucose, Bld: 168 mg/dL — ABNORMAL HIGH (ref 70–99)
Potassium: 4.6 mEq/L (ref 3.5–5.1)
Sodium: 137 mEq/L (ref 135–145)

## 2010-06-21 LAB — HEMOGLOBIN A1C: Hgb A1c MFr Bld: 7.5 % — ABNORMAL HIGH (ref 4.6–6.5)

## 2010-06-21 LAB — CBC WITH DIFFERENTIAL/PLATELET
Basophils Absolute: 0 10*3/uL (ref 0.0–0.1)
Basophils Relative: 0.5 % (ref 0.0–3.0)
Eosinophils Absolute: 0.1 10*3/uL (ref 0.0–0.7)
Eosinophils Relative: 1.1 % (ref 0.0–5.0)
Hemoglobin: 14.6 g/dL (ref 13.0–17.0)
Lymphocytes Relative: 39.6 % (ref 12.0–46.0)
Lymphs Abs: 2.5 10*3/uL (ref 0.7–4.0)
MCHC: 34.4 g/dL (ref 30.0–36.0)
MCV: 96 fl (ref 78.0–100.0)
Monocytes Absolute: 0.6 10*3/uL (ref 0.1–1.0)
Monocytes Relative: 8.9 % (ref 3.0–12.0)
Neutro Abs: 3.1 10*3/uL (ref 1.4–7.7)
Platelets: 290 10*3/uL (ref 150.0–400.0)
RBC: 4.44 Mil/uL (ref 4.22–5.81)
RDW: 12.8 % (ref 11.5–14.6)
WBC: 6.2 10*3/uL (ref 4.5–10.5)

## 2010-06-21 LAB — LDL CHOLESTEROL, DIRECT: Direct LDL: 160.1 mg/dL

## 2010-06-21 LAB — TSH: TSH: 1.05 u[IU]/mL (ref 0.35–5.50)

## 2010-06-21 LAB — LIPID PANEL
Cholesterol: 220 mg/dL — ABNORMAL HIGH (ref 0–200)
HDL: 40.8 mg/dL (ref >39.00)
Total CHOL/HDL Ratio: 5
Triglycerides: 136 mg/dL (ref 0.0–149.0)
VLDL: 27.2 mg/dL (ref 0.0–40.0)

## 2010-06-21 MED ORDER — ROSUVASTATIN CALCIUM 5 MG PO TABS: 5.0000 mg | ORAL_TABLET | Freq: Every day | ORAL | Status: DC

## 2010-06-21 MED ORDER — CARVEDILOL 12.5 MG PO TABS: 12.5000 mg | ORAL_TABLET | Freq: Two times a day (BID) | ORAL | Status: DC

## 2010-06-21 MED ORDER — METFORMIN HCL 500 MG PO TABS: ORAL_TABLET | ORAL | Status: DC

## 2010-06-21 MED ORDER — LISINOPRIL-HYDROCHLOROTHIAZIDE 20-25 MG PO TABS: 1.0000 | ORAL_TABLET | Freq: Every day | ORAL | Status: DC

## 2010-06-21 MED ORDER — GLIMEPIRIDE 2 MG PO TABS: 2.0000 mg | ORAL_TABLET | Freq: Every day | ORAL | Status: DC

## 2010-06-21 NOTE — Progress Notes (Signed)
  Subjective:    Patient ID: Bobby Obrien, male    DOB: Mar 29, 1961, 50 y.o.   MRN: [id number]  HPI  patient comes in today for followup visit for multiple medical problems.   diabetes:  Taking 2 mg of Amaryl and 1500-2000 mg of metformin a day.  He denies any side effects although did have an episode of palpitations  That resolved with feeding. He doesn't check his blood sugars at present and feels pretty good. No unusual infections although he did have an infected tooth removed and put on amoxicillin and "feels much better". No numbness or weakness or vision changes  HT : no se of meds doing well usually 120 range  Until he ran out of med. LIPID:  Ran out of crestor so stopped and off for 2 months  ? NO se .  Hx of same to simva and liptor Anxiety: doing pretty well although gets tiomes of anxiety.       Review of Systems NO CP sob edema bleeding vision changes rashes  Ulcers  Gi or GU problems  Rest as per hpi     Objective:   Physical ExamBP 110/80  Pulse 60  Wt 230 lb (104.327 kg)  General Appearance:  Alert, cooperative, no distress, appears stated age  Head:  Normocephalic, without obvious abnormality, atraumatic  Eyes:  PERRL, conjunctiva/corneas clear, EOM's intact,  Ears:  Normal TM's and external ear canals, both ears  Nose: Nares normal, septum midline, mucosa normal, no drainage or sinus tenderness  Throat: Lips, mucosa, and tongue normal;   Neck: Supple, symmetrical, trachea midline, no adenopathy, thyroid: not enlarged, symmetric, no tenderness/mass/nodules, no carotid bruit or JVD  Back:   Symmetric, no curvature, ROM normal, no CVA tenderness  Lungs:   Clear to auscultation bilaterally, respirations unlabored  Chest Wall:  No tenderness or deformity  Heart:  Regular rate and rhythm, S1, S2 normal, no murmur, rub or gallop  Abdomen:   Soft, non-tender, bowel sounds active all four quadrants,  no masses, no organomegaly  Genitalia:  Deferred   Rectal:    Extremities:  Extremities normal, atraumatic, no cyanosis or edema  Pulses: 2+ and symmetric  Skin: Skin color, texture, turgor normal, no rashes or lesions  Lymph nodes: Cervical, supraclavicular, and axillary nodes normal  Neurologic: Normal  Non focal     Diabetic foot exam  No ulcer  No callus  abnd   Filament  Is normal.  sensation           Assessment & Plan:

## 2010-06-21 NOTE — Patient Instructions (Addendum)
Continue BP control.    Restart the crestor   Call if any side effects Get diabetic eye exam.    Will notify you  of labs when available.   Then   6 months    Preventive    Visit .   You are due  For colonoscopy this year

## 2010-06-21 NOTE — Assessment & Plan Note (Signed)
Disc colonoscopy and will decide later   Decline pneumovax and tdap at present

## 2010-06-21 NOTE — Assessment & Plan Note (Addendum)
Stopped the crestor ? Ran out of samples     Has had se of med in past   Check today .    And restart  Coupon given .

## 2010-06-21 NOTE — Assessment & Plan Note (Addendum)
Readings 120 over 70  Range   .   Feels well no symptoms. Consider followup echo in the future. But presently has no symptoms.

## 2010-06-21 NOTE — Assessment & Plan Note (Signed)
Stable at present    No meds

## 2010-06-21 NOTE — Assessment & Plan Note (Signed)
Recent   Tooth infection teated.

## 2010-06-27 ENCOUNTER — Encounter: Payer: Self-pay | Admitting: *Deleted

## 2011-01-10 ENCOUNTER — Other Ambulatory Visit: Payer: Self-pay | Admitting: *Deleted

## 2011-01-10 NOTE — Telephone Encounter (Signed)
Per Dr. Fabian Sharp- Pt needs appointment.

## 2011-01-10 NOTE — Telephone Encounter (Signed)
Pt called back no fever, just hoarseness and chest cold. He is a singer and needs something called in. Pt states that when he had this last time it worked.

## 2011-01-10 NOTE — Telephone Encounter (Signed)
Pt wants a refill on Amoxil for his voice sent to [facility].  I left pt a message to call back. We need more info on why he needs this medication.

## 2011-01-10 NOTE — Telephone Encounter (Signed)
Spoke to pt- He is going to call us back to schedule an appt. Pt is moving his mother.

## 2011-07-21 ENCOUNTER — Other Ambulatory Visit: Payer: Self-pay | Admitting: Internal Medicine

## 2011-08-26 ENCOUNTER — Other Ambulatory Visit: Payer: Self-pay | Admitting: Internal Medicine

## 2011-08-28 ENCOUNTER — Telehealth: Payer: Self-pay | Admitting: Internal Medicine

## 2011-08-28 MED ORDER — LISINOPRIL-HYDROCHLOROTHIAZIDE 20-25 MG PO TABS: 1.0000 | ORAL_TABLET | Freq: Every day | ORAL | Status: DC

## 2011-08-28 NOTE — Telephone Encounter (Signed)
Pt has sch ov for tomorrow 08/29/11 at 11am. Pt is completely out of Lisinopril since Friday and wants a partial refill called in to last until office visit tomorrow. [facility] [address]. Pt req to be called today when this has been done.

## 2011-08-28 NOTE — Telephone Encounter (Signed)
Rx sent to pharmacy   

## 2011-08-29 ENCOUNTER — Ambulatory Visit (INDEPENDENT_AMBULATORY_CARE_PROVIDER_SITE_OTHER): Payer: Self-pay | Admitting: Internal Medicine

## 2011-08-29 ENCOUNTER — Encounter: Payer: Self-pay | Admitting: Internal Medicine

## 2011-08-29 VITALS — BP 118/84 | HR 68 | Temp 98.3°F | Wt 213.0 lb

## 2011-08-29 DIAGNOSIS — Z86718 Personal history of other venous thrombosis and embolism: Secondary | ICD-10-CM

## 2011-08-29 DIAGNOSIS — E119 Type 2 diabetes mellitus without complications: Secondary | ICD-10-CM

## 2011-08-29 DIAGNOSIS — Z598 Other problems related to housing and economic circumstances: Secondary | ICD-10-CM

## 2011-08-29 DIAGNOSIS — J329 Chronic sinusitis, unspecified: Secondary | ICD-10-CM

## 2011-08-29 DIAGNOSIS — I10 Essential (primary) hypertension: Secondary | ICD-10-CM

## 2011-08-29 DIAGNOSIS — E785 Hyperlipidemia, unspecified: Secondary | ICD-10-CM

## 2011-08-29 LAB — BASIC METABOLIC PANEL
BUN: 15 mg/dL (ref 6–23)
CO2: 28 mEq/L (ref 19–32)
Calcium: 9.2 mg/dL (ref 8.4–10.5)
Glucose, Bld: 186 mg/dL — ABNORMAL HIGH (ref 70–99)
Sodium: 134 mEq/L — ABNORMAL LOW (ref 135–145)

## 2011-08-29 LAB — CBC WITH DIFFERENTIAL/PLATELET
Basophils Absolute: 0 10*3/uL (ref 0.0–0.1)
Eosinophils Absolute: 0.1 10*3/uL (ref 0.0–0.7)
Hemoglobin: 15.3 g/dL (ref 13.0–17.0)
Lymphocytes Relative: 35.1 % (ref 12.0–46.0)
Lymphs Abs: 2.6 10*3/uL (ref 0.7–4.0)
MCHC: 34.1 g/dL (ref 30.0–36.0)
Neutro Abs: 3.9 10*3/uL (ref 1.4–7.7)
RDW: 12.4 % (ref 11.5–14.6)

## 2011-08-29 LAB — ALT: ALT: 27 U/L (ref 0–53)

## 2011-08-29 LAB — MICROALBUMIN / CREATININE URINE RATIO
Creatinine,U: 142.3 mg/dL
Microalb Creat Ratio: 0.8 mg/g (ref 0.0–30.0)

## 2011-08-29 LAB — AST: AST: 19 U/L (ref 0–37)

## 2011-08-29 MED ORDER — PRAVASTATIN SODIUM 20 MG PO TABS: 20.0000 mg | ORAL_TABLET | Freq: Every day | ORAL | Status: DC

## 2011-08-29 NOTE — Progress Notes (Signed)
  Subjective:    Patient ID: Bobby Obrien, male    DOB: [birth year], 51 y.o.   MRN: [id number]  HPI Patient comes in today for follow up of  multiple medical problems.  His last visit was over a year ago . Insurance  Still no health insurance  HT Thiks BP is very good sometimes missed the second carvedilol dose no cp sob edema  DM;Sugar sometimes  ocass sweating when skips meal  Taking 1- mg of amaryl per day  And the metformin,  About every 3 months  ? Rush feeling  ? Better with eating . Vision  Ok needs reading glasses. ocass etoh. No tobacco LIPIDS  Not taking crestor feels weak with this.  Taking asa   Review of Systems No numbness  Syncope cv sx bleeding cp sob  Ha vision change. Does have some increase in  Urinating without pain.  Some allergy sx but otc calratin helps   Past history family history social history reviewed in the electronic medical record. Outpatient Encounter Prescriptions as of 08/29/2011  Medication Sig Dispense Refill  . aspirin 81 MG tablet Take 81 mg by mouth daily.        . carvedilol (COREG) 12.5 MG tablet Take 1 tablet (12.5 mg total) by mouth 2 (two) times daily with meals.  60 tablet  12  . glimepiride (AMARYL) 2 MG tablet Take 1 tablet (2 mg total) by mouth every morning before breakfast.  30 tablet  12  . lisinopril-hydrochlorothiazide (PRINZIDE,ZESTORETIC) 20-25 MG per tablet Take 1 tablet by mouth daily.  30 tablet  0  . metFORMIN (GLUCOPHAGE) 500 MG tablet Take 4 tabs daily  120 tablet  12      Objective:   Physical Exam BP 118/84  Pulse 68  Temp(Src) 98.3 F (36.8 C) (Oral)  Wt 213 lb (96.616 kg) WDWN in nad looks well today  HEENT: Normocephalic ;atraumatic , Eyes;  PERRL, EOMs  Full, lids and conjunctiva clear,,Ears: no deformities, canals nl, TM landmarks normal, Nose: no deformity or discharge  Mouth : OP clear without lesion or edema . Neck: Supple without adenopathy or masses or bruits Chest:  Clear to A&P without wheezes rales or  rhonchi CV:  S1-S2 no gallops or murmurs peripheral perfusion is normal Abdomen:  Sof,t normal bowel sounds without hepatosplenomegaly, no guarding rebound or masses no CVA tenderness No clubbing cyanosis or edema feet normal withour callus and has good hair . No edema.      Assessment & Plan:  DM need labs no obv complications.  Not checking readings. Unclear whether readings a r too high or too low hen gets the sweats .    For now take lower dose 1 mg of amaryl and decide on further plans after lab back  HT  Controlled take meds as directed  Doing well  LIPIDS trial of pravachol dec expense and few se usually  SE of meds  NO health insurance  Hx of Cardiomyopathy stable clinically and bp with good control  Needs yearly labs  Fu as possible  Get eye check .   Need eye check

## 2011-08-29 NOTE — Patient Instructions (Addendum)
Will notify you  of labs when available.  Then make plans for followup. Eye check when you can.  If getting sweating episodes need you to take your blood sugar readings.  You can call us for advice but the [facility] per minute can lower your blood sugar more rapidly than other medicines. We can adjust the dosing.  Trial of  pravachol for cholesterol. Most people don't have side effects from it but it is worth trying because you have diabetes it may prevent heart attack and stroke. if you get muscle aches call us.

## 2011-08-30 ENCOUNTER — Encounter: Payer: Self-pay | Admitting: Internal Medicine

## 2011-09-03 DIAGNOSIS — Z598 Other problems related to housing and economic circumstances: Secondary | ICD-10-CM | POA: Insufficient documentation

## 2011-09-07 ENCOUNTER — Other Ambulatory Visit: Payer: Self-pay | Admitting: Internal Medicine

## 2011-09-07 DIAGNOSIS — E119 Type 2 diabetes mellitus without complications: Secondary | ICD-10-CM

## 2011-09-07 DIAGNOSIS — E785 Hyperlipidemia, unspecified: Secondary | ICD-10-CM

## 2011-09-07 NOTE — Progress Notes (Signed)
Quick Note:  Pt aware of lab results. Results mailed. ______

## 2011-09-15 ENCOUNTER — Other Ambulatory Visit: Payer: Self-pay | Admitting: Internal Medicine

## 2011-09-25 ENCOUNTER — Other Ambulatory Visit: Payer: Self-pay | Admitting: Internal Medicine

## 2011-09-27 ENCOUNTER — Telehealth: Payer: Self-pay | Admitting: Internal Medicine

## 2011-09-27 NOTE — Telephone Encounter (Signed)
Pt called req refills and has been informed that appt must be made before more refills, according to note on last script for Lisinopril from April 2013. Pt is still req call back from nurse.

## 2011-09-28 NOTE — Telephone Encounter (Signed)
Called and spoke with pt.  Advised pt that according to lab work pt is to have a return visit in 3 months to f/u on diabetes.  Pt aware rx sent to pharmacy.

## 2012-02-13 ENCOUNTER — Other Ambulatory Visit: Payer: Self-pay | Admitting: Internal Medicine

## 2012-02-15 ENCOUNTER — Other Ambulatory Visit: Payer: Self-pay | Admitting: Family Medicine

## 2012-02-15 MED ORDER — GLIMEPIRIDE 2 MG PO TABS: 2.0000 mg | ORAL_TABLET | Freq: Every day | ORAL | Status: DC

## 2012-02-16 ENCOUNTER — Other Ambulatory Visit: Payer: Self-pay | Admitting: Internal Medicine

## 2012-02-19 ENCOUNTER — Other Ambulatory Visit: Payer: Self-pay | Admitting: Family Medicine

## 2012-02-19 MED ORDER — GLIMEPIRIDE 2 MG PO TABS: 2.0000 mg | ORAL_TABLET | Freq: Every day | ORAL | Status: DC

## 2012-05-06 ENCOUNTER — Other Ambulatory Visit: Payer: Self-pay | Admitting: Internal Medicine

## 2012-06-13 ENCOUNTER — Other Ambulatory Visit: Payer: Self-pay | Admitting: Internal Medicine

## 2012-09-12 ENCOUNTER — Other Ambulatory Visit: Payer: Self-pay | Admitting: Internal Medicine

## 2012-09-13 ENCOUNTER — Telehealth: Payer: Self-pay | Admitting: Family Medicine

## 2012-09-13 NOTE — Telephone Encounter (Signed)
This patient has not been seen in over 1 year.  Please call the pt and make an appt.  I will refill his lisinopril for 30 days.  Thanks!!

## 2012-09-29 ENCOUNTER — Other Ambulatory Visit: Payer: Self-pay | Admitting: Internal Medicine

## 2012-10-23 ENCOUNTER — Telehealth: Payer: Self-pay | Admitting: Internal Medicine

## 2012-10-23 NOTE — Telephone Encounter (Addendum)
lisinopril-hydrochlorothiazide (PRINZIDE,ZESTORETIC) 20-25 MG   (90 day supply)  glimepiride (AMARYL) 2 MG tablet  (90 day supply) [facility]/ [address]  Advised pt he needed follow up appt for those meds.Marland Kitchenappointment made 7/1 @ 10:30

## 2012-10-23 NOTE — Telephone Encounter (Signed)
This patient needs to get on the scheduled for CPE.  Please have him make appt.  Will refill until he is seen.  Thanks!!

## 2012-10-24 MED ORDER — GLIMEPIRIDE 2 MG PO TABS: 2.0000 mg | ORAL_TABLET | Freq: Every day | ORAL | Status: DC

## 2012-10-24 MED ORDER — LISINOPRIL-HYDROCHLOROTHIAZIDE 20-25 MG PO TABS: 1.0000 | ORAL_TABLET | Freq: Every day | ORAL | Status: DC

## 2012-10-24 NOTE — Telephone Encounter (Signed)
Pt called back - we set him up for a cpx 7/16 with labs 7/8. He is still waiting to get the lisinopril and amaryl filled. He is completely out of his BP med.

## 2012-10-24 NOTE — Telephone Encounter (Signed)
Rx sent to pharmacy for 30 days until appt.

## 2012-10-24 NOTE — Telephone Encounter (Signed)
lmom for pt to call back/kh

## 2012-11-05 ENCOUNTER — Ambulatory Visit: Payer: Self-pay | Admitting: Internal Medicine

## 2012-11-12 ENCOUNTER — Other Ambulatory Visit (INDEPENDENT_AMBULATORY_CARE_PROVIDER_SITE_OTHER): Payer: Self-pay

## 2012-11-12 DIAGNOSIS — Z0000 Encounter for general adult medical examination without abnormal findings: Secondary | ICD-10-CM

## 2012-11-12 LAB — HEPATIC FUNCTION PANEL
ALT: 34 U/L (ref 0–53)
Albumin: 4.1 g/dL (ref 3.5–5.2)
Bilirubin, Direct: 0.1 mg/dL (ref 0.0–0.3)
Total Protein: 6.9 g/dL (ref 6.0–8.3)

## 2012-11-12 LAB — BASIC METABOLIC PANEL
CO2: 27 mEq/L (ref 19–32)
Chloride: 99 mEq/L (ref 96–112)
Creatinine, Ser: 0.9 mg/dL (ref 0.4–1.5)
Potassium: 4.1 mEq/L (ref 3.5–5.1)

## 2012-11-12 LAB — CBC WITH DIFFERENTIAL/PLATELET
Basophils Relative: 0.4 % (ref 0.0–3.0)
Eosinophils Absolute: 0.1 10*3/uL (ref 0.0–0.7)
Eosinophils Relative: 1.1 % (ref 0.0–5.0)
HCT: 44.7 % (ref 39.0–52.0)
Hemoglobin: 15.5 g/dL (ref 13.0–17.0)
Lymphs Abs: 2.4 10*3/uL (ref 0.7–4.0)
MCHC: 34.7 g/dL (ref 30.0–36.0)
MCV: 95.4 fl (ref 78.0–100.0)
Monocytes Absolute: 0.5 10*3/uL (ref 0.1–1.0)
Neutro Abs: 4.1 10*3/uL (ref 1.4–7.7)
RBC: 4.69 Mil/uL (ref 4.22–5.81)
WBC: 7.2 10*3/uL (ref 4.5–10.5)

## 2012-11-12 LAB — HEMOGLOBIN A1C: Hgb A1c MFr Bld: 8.2 % — ABNORMAL HIGH (ref 4.6–6.5)

## 2012-11-12 LAB — LIPID PANEL
Cholesterol: 214 mg/dL — ABNORMAL HIGH (ref 0–200)
VLDL: 71 mg/dL — ABNORMAL HIGH (ref 0.0–40.0)

## 2012-11-12 LAB — MICROALBUMIN / CREATININE URINE RATIO
Creatinine,U: 179.6 mg/dL
Microalb Creat Ratio: 4.7 mg/g (ref 0.0–30.0)

## 2012-11-12 LAB — PSA: PSA: 2.96 ng/mL (ref 0.10–4.00)

## 2012-11-20 ENCOUNTER — Ambulatory Visit (INDEPENDENT_AMBULATORY_CARE_PROVIDER_SITE_OTHER): Payer: Self-pay | Admitting: Internal Medicine

## 2012-11-20 ENCOUNTER — Encounter: Payer: Self-pay | Admitting: Internal Medicine

## 2012-11-20 VITALS — BP 118/80 | Temp 98.0°F | Ht 72.0 in | Wt 221.0 lb

## 2012-11-20 DIAGNOSIS — Z789 Other specified health status: Secondary | ICD-10-CM

## 2012-11-20 DIAGNOSIS — Z5971 Insufficient health insurance coverage: Secondary | ICD-10-CM

## 2012-11-20 DIAGNOSIS — I110 Hypertensive heart disease with heart failure: Secondary | ICD-10-CM

## 2012-11-20 DIAGNOSIS — E119 Type 2 diabetes mellitus without complications: Secondary | ICD-10-CM

## 2012-11-20 DIAGNOSIS — J329 Chronic sinusitis, unspecified: Secondary | ICD-10-CM

## 2012-11-20 DIAGNOSIS — Z888 Allergy status to other drugs, medicaments and biological substances status: Secondary | ICD-10-CM

## 2012-11-20 DIAGNOSIS — I10 Essential (primary) hypertension: Secondary | ICD-10-CM

## 2012-11-20 DIAGNOSIS — E785 Hyperlipidemia, unspecified: Secondary | ICD-10-CM

## 2012-11-20 DIAGNOSIS — Z598 Other problems related to housing and economic circumstances: Secondary | ICD-10-CM

## 2012-11-20 DIAGNOSIS — Z0000 Encounter for general adult medical examination without abnormal findings: Secondary | ICD-10-CM

## 2012-11-20 DIAGNOSIS — Z86718 Personal history of other venous thrombosis and embolism: Secondary | ICD-10-CM

## 2012-11-20 DIAGNOSIS — I509 Heart failure, unspecified: Secondary | ICD-10-CM

## 2012-11-20 MED ORDER — PRAVASTATIN SODIUM 20 MG PO TABS: 20.0000 mg | ORAL_TABLET | Freq: Every day | ORAL | Status: DC

## 2012-11-20 NOTE — Progress Notes (Signed)
Chief Complaint  Patient presents with  . Annual Exam    HPI: Patient comes in today for Preventive Health Care visit  And followup of his multiple can be used conditions.   No major changes in his health he still doesn't have health insurance  ocass tingling bottom of feet.  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    Occasional tingling on the bottom of his feet but no numbness or falling No side effects her blood pressure medicine works well. Doesn't check his blood sugars has gone down on the metformin to 2 500 today because he's had an occasional feeling of low blood sugar we get shaky and has to eat something. This is usually in the afternoon evening. He is taking glipizide 2 mg every day. States that he couldn't take the Crestor and had side effects. Although Pravachol was on his list he never really took it. Doesn't know if it gives him a side effect he certainly did with atorvastatin simvastatin and somewhat Crestor. Eats fairly healthy they could do better. ROS:  GEN/ HEENT: No fever, significant weight changes sweats headaches vision problems hearing changes, due for an eye check CV/ PULM; No chest pain shortness of breath cough, syncope,edema  change in exercise tolerance. GI /GU: No adominal pain, vomiting, change in bowel habits. No blood in the stool. No significant GU symptoms. SKIN/HEME: ,no acute skin rashes suspicious lesions or bleeding. No lymphadenopathy, nodules, masses.  NEURO/ PSYCH:  No neurologic signs such as weakness numbness. No depression anxiety. IMM/ Allergy: No unusual infections.  Allergy .   REST of 12 system review negative except as per HPI   Past Medical History  Diagnosis Date  . DIABETES MELLITUS, TYPE II 12/04/2006  . HYPERLIPIDEMIA 12/04/2006  . PANIC DISORDER 12/04/2006  . HYPERTENSION 12/04/2006  . Personal history of venous thrombosis and embolism 12/04/2006  . CONGESTIVE HEART FAILURE 12/04/2006    2005 Dec lvf echo 50-55% ef 2007   . History of DVT (deep vein thrombosis)     with PE 12 2005  . History of pulmonary embolism     12 05    with HF and hypertension    Family History  Problem Relation Age of Onset  . Kidney disease Mother   . Hypertension Mother   . Hypertension Father   . Heart attack Father     History   Social History  . Marital Status: Married    Spouse Name: N/A    Number of Children: N/A  . Years of Education: N/A   Social History Main Topics  . Smoking status: Never Smoker   . Smokeless tobacco: None  . Alcohol Use: Yes  . Drug Use: No  . Sexually Active: None   Other Topics Concern  . 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     Outpatient Encounter Prescriptions as of 11/20/2012  Medication Sig Dispense Refill  . aspirin 81 MG tablet Take 81 mg by mouth daily.        . CRESTOR 5 MG tablet TAKE ONE TABLET BY MOUTH EVERY DAY  30 each  5  . glimepiride (AMARYL) 2 MG tablet Take 1 tablet (2 mg total) by mouth daily before breakfast.  30 tablet  0  . lisinopril-hydrochlorothiazide (PRINZIDE,ZESTORETIC) 20-25 MG per tablet TAKE  ONE TABLET BY MOUTH EVERY DAY  30 tablet  3  . lisinopril-hydrochlorothiazide (PRINZIDE,ZESTORETIC) 20-25 MG per tablet TAKE ONE TABLET BY MOUTH EVERY DAY  30 tablet  0  . lisinopril-hydrochlorothiazide (PRINZIDE,ZESTORETIC) 20-25 MG per tablet Take 1 tablet by mouth daily.  30 tablet  0  . metFORMIN (GLUCOPHAGE) 500 MG tablet TAKE FOUR TABLETS BY MOUTH EVERY DAY  120 tablet  0  . carvedilol (COREG) 12.5 MG tablet Take 1 tablet (12.5 mg total) by mouth 2 (two) times daily with meals.  60 tablet  12  . pravastatin (PRAVACHOL) 20 MG tablet Take 1 tablet (20 mg total) by mouth daily.  30 tablet  6  . [DISCONTINUED] pravastatin (PRAVACHOL) 20 MG tablet Take 1 tablet (20 mg total) by mouth daily.  30 tablet  6   No facility-administered encounter  medications on file as of 11/20/2012.   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 EXAM:  BP 118/80  Temp(Src) 98 F (36.7 C) (Oral)  Ht 6' (1.829 m)  Wt 221 lb (100.245 kg)  BMI 29.97 kg/m2  Body mass index is 29.97 kg/(m^2).  Physical Exam: Vital signs reviewed ION:GEXB is a well-developed well-nourished alert cooperative   male who appears  stated age in no acute distress.  HEENT: normocephalic atraumatic , Eyes: PERRL EOM's full, conjunctiva clear, Nares: paten,t no deformity discharge or tenderness., Ears: no deformity EAC's clear TMs with normal landmarks. Mouth: clear OP, no lesions, edema.  Moist mucous membranes. Dentition in adequate repair. NECK: supple without masses, thyromegaly or bruits. No adenopathy CHEST/PULM:  Clear to auscultation and percussion breath sounds equal no wheeze , rales or rhonchi. No chest wall deformities or tenderness. CV: PMI is nondisplaced, S1 S2 no gallops, murmurs, rubs. Peripheral pulses are full without delay.No JVD .  ABDOMEN: Bowel sounds normal nontender  No guard or rebound, no hepato splenomegal no CVA tenderness.  No hernia. Extremtities:  No clubbing cyanosis or edema, no acute joint swelling or redness no focal atrophy NEURO:  Oriented x3, cranial nerves 3-12 appear to be intact, no obvious focal weakness,gait within normal limits no abnormal reflexes or asymmetrical SKIN: No acute rashes normal turgor, color, no bruising or petechiae. PSYCH: Oriented, good eye contact, no obvious depression anxiety, cognition and judgment appear normal. LN: no cervical axillary inguinal adenopathy  Lab Results  Component Value Date   WBC 7.2 11/12/2012   HGB 15.5 11/12/2012   HCT 44.7 11/12/2012   PLT 292.0 11/12/2012   GLUCOSE 259* 11/12/2012   CHOL 214* 11/12/2012   TRIG 355.0* 11/12/2012   HDL 40.50 11/12/2012   LDLDIRECT 129.6 11/12/2012   LDLCALC 76 10/17/2007   ALT 34 11/12/2012   AST 22 11/12/2012   NA 135 11/12/2012   K 4.1 11/12/2012    CL 99 11/12/2012   CREATININE 0.9 11/12/2012   BUN 10 11/12/2012   CO2 27 11/12/2012   TSH 1.39 11/12/2012   PSA 2.96 11/12/2012   HGBA1C 8.2* 11/12/2012   MICROALBUR 8.5* 11/12/2012    ASSESSMENT AND PLAN:  Discussed the following assessment and plan:  HYPERTENSION - Plan: pravastatin (PRAVACHOL) 20 MG tablet  DISEASE, HYPERTENSIVE HEART, BENIGN, W/HF  HYPERLIPIDEMIA - Plan: pravastatin (PRAVACHOL) 20 MG tablet  DIABETES MELLITUS, TYPE II - Plan: pravastatin (PRAVACHOL) 20 MG tablet  Personal history of venous thrombosis and embolism - Plan: pravastatin (PRAVACHOL) 20 MG tablet  POSTNASAL DRIP SYNDROME - Plan: pravastatin (PRAVACHOL) 20 MG tablet  Does not have health  insurance - Plan: pravastatin (PRAVACHOL) 20 MG tablet  Statin intolerance - 3 statins but hasn't tried Pravachol yet Problematic with no health insurance. Discussed better to stay on the higher dose of metformin and go down on the glipizide if there is a problem with potential hypoglycemia. His diabetes is not adequately controlled at this time although he certainly may be getting let us. Showed him the numbers plan followup testing in about 4 months. Try starting the pravastatin check with [facility] for prices get labs and if okay can see him in 6 months. Blood pressure is excellent today. Patient Care Team: Madelin Headings, MD as PCP - General Patient Instructions  Your blood pressure is excellent continue same medications.  Your blood sugar is elevated and needs to be better A1c should be in the low 7 range is or below 7.  To avoid low blood sugars I would take the full dose of metformin for 500 mg a day and decrease the glipizide to a half a dose daily.  Exercise can lower blood sugar and help control don't skip snacks her meals and avoid sweets as this can swing her blood sugar and help at goal.  Your exam is good but the tingling in your feet could be early signs of diabetes neuropathy and the best thing we can do is have  you control the blood sugar.   When you obtain insurance we can refer you for colonoscopy cancer screening.  Your cholesterol should be better. Tried the pravastatin cholesterol medicine it may not cause the muscle aches.  Please contact us after a month or 2 of the medicine to see how you are tolerating it.      Plan repeat blood work in about 3-4  months cholesterol and hemoglobin A1c to see how your diabetes and cholesterol are doing. Plan OV in 6 months  .    Neta Mends. Zakyria Metzinger M.D.  Health Maintenance  Topic Date Due  . Colonoscopy  07/14/2009  . Influenza Vaccine  01/06/2013  . Pneumococcal Polysaccharide Vaccine (#2) 06/22/2015  . Tetanus/tdap  06/21/2020   Health Maintenance Review

## 2012-11-20 NOTE — Patient Instructions (Addendum)
Your blood pressure is excellent continue same medications.  Your blood sugar is elevated and needs to be better A1c should be in the low 7 range is or below 7.  To avoid low blood sugars I would take the full dose of metformin for 500 mg a day and decrease the glipizide to a half a dose daily.  Exercise can lower blood sugar and help control don't skip snacks her meals and avoid sweets as this can swing her blood sugar and help at goal.  Your exam is good but the tingling in your feet could be early signs of diabetes neuropathy and the best thing we can do is have you control the blood sugar.   When you obtain insurance we can refer you for colonoscopy cancer screening.  Your cholesterol should be better. Tried the pravastatin cholesterol medicine it may not cause the muscle aches.  Please contact us after a month or 2 of the medicine to see how you are tolerating it.      Plan repeat blood work in about 3-4  months cholesterol and hemoglobin A1c to see how your diabetes and cholesterol are doing. Plan OV in 6 months  .

## 2012-11-22 ENCOUNTER — Encounter: Payer: Self-pay | Admitting: Internal Medicine

## 2012-11-22 DIAGNOSIS — Z0000 Encounter for general adult medical examination without abnormal findings: Secondary | ICD-10-CM | POA: Insufficient documentation

## 2012-12-05 ENCOUNTER — Telehealth: Payer: Self-pay | Admitting: Internal Medicine

## 2012-12-05 MED ORDER — LISINOPRIL-HYDROCHLOROTHIAZIDE 20-25 MG PO TABS: ORAL_TABLET | ORAL | Status: DC

## 2012-12-05 NOTE — Telephone Encounter (Signed)
PT is calling to request 3 month supply of his lisinopril-hydrochlorothiazide (PRINZIDE,ZESTORETIC) 20-25 MG per tablet [facility] the [facility] [address]. Please assist .

## 2012-12-05 NOTE — Telephone Encounter (Signed)
Sent by e-scribe. 

## 2012-12-23 ENCOUNTER — Other Ambulatory Visit: Payer: Self-pay | Admitting: Family Medicine

## 2012-12-23 MED ORDER — METFORMIN HCL 500 MG PO TABS: ORAL_TABLET | ORAL | Status: DC

## 2013-02-28 ENCOUNTER — Other Ambulatory Visit: Payer: Self-pay | Admitting: Family Medicine

## 2013-08-04 ENCOUNTER — Telehealth: Payer: Self-pay | Admitting: Internal Medicine

## 2013-08-04 DIAGNOSIS — E785 Hyperlipidemia, unspecified: Secondary | ICD-10-CM

## 2013-08-04 DIAGNOSIS — E119 Type 2 diabetes mellitus without complications: Secondary | ICD-10-CM

## 2013-08-04 NOTE — Telephone Encounter (Signed)
Pt  Is overdue for OV  And hg a1c  And lipid management. Please have him schedule these labs and appt. Can try referral  dont know if it will go through.

## 2013-08-04 NOTE — Telephone Encounter (Signed)
Pt wants a referral to see the dentist Consuella Lose [phone] loose tooth.  He has [employer] requires the referral.

## 2013-08-05 ENCOUNTER — Other Ambulatory Visit: Payer: Self-pay | Admitting: Family Medicine

## 2013-08-05 DIAGNOSIS — K0889 Other specified disorders of teeth and supporting structures: Secondary | ICD-10-CM

## 2013-08-05 NOTE — Telephone Encounter (Signed)
LMOM for pt to call office and schedule OV and pre-visit lab.

## 2013-08-05 NOTE — Telephone Encounter (Signed)
I have ordered the referral to dentistry.  Please schedule the pt for an OV with lab work one week before he sees Dr. Mamie Nick. Thanks!

## 2013-08-07 NOTE — Telephone Encounter (Signed)
LMOM again for pt to call office and schedule OV with pre-visit lab.  It was detailed that he didn't need to speak back to [facility] just to schedule an OV with pre-visit lab.

## 2013-08-26 ENCOUNTER — Other Ambulatory Visit: Payer: Self-pay | Admitting: Internal Medicine

## 2013-08-26 NOTE — Telephone Encounter (Signed)
Patient has upcoming appointment on 09/03/13

## 2013-09-03 ENCOUNTER — Other Ambulatory Visit (INDEPENDENT_AMBULATORY_CARE_PROVIDER_SITE_OTHER): Payer: No Typology Code available for payment source

## 2013-09-03 DIAGNOSIS — E119 Type 2 diabetes mellitus without complications: Secondary | ICD-10-CM

## 2013-09-03 DIAGNOSIS — E785 Hyperlipidemia, unspecified: Secondary | ICD-10-CM

## 2013-09-03 LAB — LIPID PANEL
CHOL/HDL RATIO: 5
Cholesterol: 202 mg/dL — ABNORMAL HIGH (ref 0–200)
HDL: 42.2 mg/dL (ref >39.00)
LDL Cholesterol: 131 mg/dL — ABNORMAL HIGH (ref 0–99)
Triglycerides: 143 mg/dL (ref 0.0–149.0)
VLDL: 28.6 mg/dL (ref 0.0–40.0)

## 2013-09-03 LAB — HEMOGLOBIN A1C: HEMOGLOBIN A1C: 8.5 % — AB (ref 4.6–6.5)

## 2013-09-03 NOTE — Patient Instructions (Signed)
, °

## 2013-09-17 ENCOUNTER — Other Ambulatory Visit: Payer: Self-pay | Admitting: Internal Medicine

## 2013-09-22 ENCOUNTER — Encounter: Payer: Self-pay | Admitting: Internal Medicine

## 2013-09-22 ENCOUNTER — Ambulatory Visit (INDEPENDENT_AMBULATORY_CARE_PROVIDER_SITE_OTHER): Payer: No Typology Code available for payment source | Admitting: Internal Medicine

## 2013-09-22 VITALS — BP 102/70 | Temp 98.0°F | Ht 72.25 in | Wt 211.0 lb

## 2013-09-22 DIAGNOSIS — R202 Paresthesia of skin: Secondary | ICD-10-CM

## 2013-09-22 DIAGNOSIS — I110 Hypertensive heart disease with heart failure: Secondary | ICD-10-CM

## 2013-09-22 DIAGNOSIS — Z86718 Personal history of other venous thrombosis and embolism: Secondary | ICD-10-CM

## 2013-09-22 DIAGNOSIS — Z598 Other problems related to housing and economic circumstances: Secondary | ICD-10-CM

## 2013-09-22 DIAGNOSIS — I10 Essential (primary) hypertension: Secondary | ICD-10-CM

## 2013-09-22 DIAGNOSIS — R209 Unspecified disturbances of skin sensation: Secondary | ICD-10-CM

## 2013-09-22 DIAGNOSIS — Z5989 Other problems related to housing and economic circumstances: Secondary | ICD-10-CM

## 2013-09-22 DIAGNOSIS — I509 Heart failure, unspecified: Secondary | ICD-10-CM

## 2013-09-22 DIAGNOSIS — E785 Hyperlipidemia, unspecified: Secondary | ICD-10-CM

## 2013-09-22 DIAGNOSIS — E119 Type 2 diabetes mellitus without complications: Secondary | ICD-10-CM

## 2013-09-22 LAB — GLUCOSE, POCT (MANUAL RESULT ENTRY): POC GLUCOSE: 142 mg/dL — AB (ref 70–99)

## 2013-09-22 MED ORDER — PRAVASTATIN SODIUM 20 MG PO TABS: 20.0000 mg | ORAL_TABLET | Freq: Every day | ORAL | Status: DC

## 2013-09-22 NOTE — Assessment & Plan Note (Signed)
Tingling feet worrisome exam normal  He stopped SU for poss se an only taking 1500 metformin  Increase to 2000 mg per day may cost a factor consider adding other meds if needed  Continue lifestyle intervention healthy eating and add exercise .

## 2013-09-22 NOTE — Assessment & Plan Note (Signed)
Excellent bp control now tool self off coreg  Still controlled  No sx of chr or cm continue ace hctz   If had insurance consider getting fu echo but sine no sx continue

## 2013-09-22 NOTE — Assessment & Plan Note (Signed)
hasnt tried prava  Check into cost consider lova st if tolerated

## 2013-09-22 NOTE — Progress Notes (Signed)
Chief Complaint  Patient presents with  . Follow-up  . Hypertension  . Diabetes    HPI: Bobby Obrien  comes in today for follow up of  multiple medical problems.    Last OV July 2014  Un able to get insurance from health care act makes too little money . Works weekend still music  DMStopped  amaryl  Rushes felt bad   Stopped the [employer] was not needed and blood pressure good    .  No cp sob    Some etoh. When works on weekends upt to 3  ? If effects this.  No exerecise .  Lost weight from eating better  Avoiding soda Rice pasta.    Taking 3 per day.  Metformin. Instead of 4   Never took?  pravastatin hx of se of atorva crest simva  Eating well otherwise has lost ? 20 # total  ROS: See pertinent positives and negatives per HPI.no cp sob vision ok needs dentist in network.  Tingling off and on bottom of feet   Past Medical History  Diagnosis Date  . DIABETES MELLITUS, TYPE II 12/04/2006  . HYPERLIPIDEMIA 12/04/2006  . PANIC DISORDER 12/04/2006  . HYPERTENSION 12/04/2006  . Personal history of venous thrombosis and embolism 12/04/2006  . CONGESTIVE HEART FAILURE 12/04/2006    2005 Dec lvf echo 50-55% ef 2007  . History of DVT (deep vein thrombosis)     with PE 12 2005  . History of pulmonary embolism     12 05    with HF and hypertension    Family History  Problem Relation Age of Onset  . Kidney disease Mother   . Hypertension Mother   . Hypertension Father   . Heart attack Father     History   Social History  . Marital Status: Married    Spouse Name: N/A    Number of Children: N/A  . Years of Education: N/A   Social History Main Topics  . Smoking status: Never Smoker   . Smokeless tobacco: None  . Alcohol Use: Yes  . Drug Use: No  . Sexual Activity: None   Other Topics Concern  . 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           Outpatient Encounter  Prescriptions as of 09/22/2013  Medication Sig  . aspirin 81 MG tablet Take 81 mg by mouth daily.    Marland Kitchen lisinopril-hydrochlorothiazide (PRINZIDE,ZESTORETIC) 20-25 MG per tablet TAKE ONE TABLET BY MOUTH EVERY DAY  . lisinopril-hydrochlorothiazide (PRINZIDE,ZESTORETIC) 20-25 MG per tablet TAKE ONE TABLET BY MOUTH ONCE DAILY  . metFORMIN (GLUCOPHAGE) 500 MG tablet Take 4 tablets (2,000 mg total) by mouth daily with breakfast.  . [DISCONTINUED] lisinopril-hydrochlorothiazide (PRINZIDE,ZESTORETIC) 20-25 MG per tablet Take 1 tablet by mouth daily.  . pravastatin (PRAVACHOL) 20 MG tablet Take 1 tablet (20 mg total) by mouth daily.  . [DISCONTINUED] carvedilol (COREG) 12.5 MG tablet Take 1 tablet (12.5 mg total) by mouth 2 (two) times daily with meals.  . [DISCONTINUED] glimepiride (AMARYL) 2 MG tablet Take 1 tablet (2 mg total) by mouth daily before breakfast.  . [DISCONTINUED] pravastatin (PRAVACHOL) 20 MG tablet Take 1 tablet (20 mg total) by mouth daily.    EXAM:  BP 102/70  Temp(Src) 98 F (36.7 C) (Oral)  Ht 6' 0.25" (1.835 m)  Wt 211 lb (95.709 kg)  BMI 28.42 kg/m2  Body mass index is 28.42 kg/(m^2). Wt Readings from Last 3 Encounters:  09/22/13 211 lb (95.709 kg)  11/20/12 221 lb (100.245 kg)  08/29/11 213 lb (96.616 kg)    GENERAL: vitals reviewed and listed above, alert, oriented, appears well hydrated and in no acute distress looks well  HEENT: atraumatic, conjunctiva  clear, no obvious abnormalities on inspection of external nose and ears OP : no lesion edema or exudate  NECK: no obvious masses on inspection palpation  No jvd LUNGS: clear to auscultation bilaterally, no wheezes, rales or rhonchi, good air movement CV: HRRR, no clubbing cyanosis or  peripheral edema nl cap refill  Abdomen:  Sof,t normal bowel sounds without hepatosplenomegaly, no guarding rebound or masses no CVA tenderness MS: moves all extremities without noticeable focal  Abnormality Diabetic foot exam    PSYCH: pleasant and cooperative, no obvious depression or anxiety Lab Results  Component Value Date   WBC 7.2 11/12/2012   HGB 15.5 11/12/2012   HCT 44.7 11/12/2012   PLT 292.0 11/12/2012   GLUCOSE 259* 11/12/2012   CHOL 202* 09/03/2013   TRIG 143.0 09/03/2013   HDL 42.20 09/03/2013   LDLDIRECT 129.6 11/12/2012   LDLCALC 131* 09/03/2013   ALT 34 11/12/2012   AST 22 11/12/2012   NA 135 11/12/2012   K 4.1 11/12/2012   CL 99 11/12/2012   CREATININE 0.9 11/12/2012   BUN 10 11/12/2012   CO2 27 11/12/2012   TSH 1.39 11/12/2012   PSA 2.96 11/12/2012   HGBA1C 8.5* 09/03/2013   MICROALBUR 8.5* 11/12/2012    ASSESSMENT AND PLAN:  Discussed the following assessment and plan:  DIABETES MELLITUS, TYPE II - declined prevnar 13 - Plan: pravastatin (PRAVACHOL) 20 MG tablet, POC Glucose (CBG)  HYPERTENSION - Plan: pravastatin (PRAVACHOL) 20 MG tablet  Personal history of venous thrombosis and embolism - Plan: pravastatin (PRAVACHOL) 20 MG tablet  HYPERLIPIDEMIA - Plan: pravastatin (PRAVACHOL) 20 MG tablet  Does not have health insurance - Plan: pravastatin (PRAVACHOL) 20 MG tablet  DISEASE, HYPERTENSIVE HEART, BENIGN, W/HF  Tingling  -Patient advised to return or notify health care team  if symptoms worsen ,persist or new concerns arise.  Patient Instructions   Take 4 -500 mg per day metformin   2000 mg per day. 2 twice  A day   Check in to price of the pravastatin.    If not ask about lovastatin.  Sugar  is not controlled at this  Time.  And concern that the tingling in the feet .  Could be from this. There are other medications also available  But are branded medication although some companies have  Reduced rates.   No sweet beverages also sodas etc .   prevnar 13 is advised when possible.  Ok to stay off of the coreg since your BP is very good.  Stay  on the prinizide   Recheck BMP and hga1c in 4 months or as needed .    Standley Brooking. Panosh M.D.  Pre visit review using our clinic review tool, if  applicable. No additional management support is needed unless otherwise documented below in the visit note.

## 2013-09-22 NOTE — Patient Instructions (Addendum)
   Take 4 -500 mg per day metformin   2000 mg per day. 2 twice  A day   Check in to price of the pravastatin.    If not ask about lovastatin.  Sugar  is not controlled at this  Time.  And concern that the tingling in the feet .  Could be from this. There are other medications also available  But are branded medication although some companies have  Reduced rates.   No sweet beverages also sodas etc .   prevnar 13 is advised when possible.  Ok to stay off of the coreg since your BP is very good.  Stay  on the prinizide   Recheck BMP and hga1c in 4 months or as needed .

## 2013-09-23 ENCOUNTER — Telehealth: Payer: Self-pay | Admitting: Internal Medicine

## 2013-09-23 NOTE — Telephone Encounter (Signed)
Relevant patient education assigned to patient using Emmi. ° °

## 2013-09-27 ENCOUNTER — Other Ambulatory Visit: Payer: Self-pay | Admitting: Internal Medicine

## 2013-09-30 NOTE — Telephone Encounter (Signed)
Ok to refill 90 days ( should be getting labs in 3-4 months )  Before runs out of the 90 days

## 2013-09-30 NOTE — Telephone Encounter (Signed)
No recent BMP.  Please advise.  Thanks!

## 2013-10-01 NOTE — Telephone Encounter (Signed)
Sent to the pharmacy by e-scribe. 

## 2013-10-10 ENCOUNTER — Telehealth: Payer: Self-pay | Admitting: Family Medicine

## 2013-10-10 NOTE — Telephone Encounter (Signed)
Message copied by Hulda Humphrey on Fri Oct 10, 2013  9:43 AM ------      Message from: [facility]      Created: Thu Oct 09, 2013  9:48 PM       i am willing to take this particular patient cause i know him well. But no one else unless they already are my patient and I agree.       Thanks       WP      ----- Message -----         From: Hulda Humphrey, CMA         Sent: 09/24/2013  10:35 AM           To: Burnis Medin, MD            Dr. Mamie Nick,            Do you want to continue to see this patient?       ----- Message -----         From: Vaughan Browner         Sent: 09/24/2013  10:04 AM           To: Hulda Humphrey, CMA            The following provider agreed to accept 2 of these patients per year:            Melody Haver, Shela Nevin and Maudie Mercury.            Dr. Regis Bill did not agree to accept these patients when we discussed this as a group. Therefore, I am not sure if she is willing to continue to see this patient as she is currently not listed as a participating provider with [facility].            Thanks,      Derinda Late                    ----- Message -----         From: Hulda Humphrey, CMA         Sent: 09/22/2013   2:50 PM           To: Vaughan Browner            Patient has the [employer].  Are we accepting this?                   ------

## 2013-10-13 NOTE — Telephone Encounter (Signed)
Notified the [facility] coordinator that Dr. Regis Bill agreed to see this patient only so that their files can be updated.

## 2013-11-11 ENCOUNTER — Other Ambulatory Visit: Payer: Self-pay | Admitting: Internal Medicine

## 2013-11-13 NOTE — Telephone Encounter (Signed)
Sent to the pharmacy by e-scribe. 

## 2013-12-17 ENCOUNTER — Telehealth: Payer: Self-pay | Admitting: *Deleted

## 2013-12-17 DIAGNOSIS — E119 Type 2 diabetes mellitus without complications: Secondary | ICD-10-CM

## 2013-12-17 NOTE — Telephone Encounter (Signed)
Left message on machine for patient to schedule a follow up visit for diabetes A1c, bmet, micro albumin Diabetic bundle

## 2014-02-02 ENCOUNTER — Other Ambulatory Visit: Payer: Self-pay | Admitting: Internal Medicine

## 2014-02-03 NOTE — Telephone Encounter (Signed)
#  30 sent to the pharmacy by e-scribe.  Left a message for the pt to call back and make a lab and follow up appt with Dr. Regis Bill.

## 2014-03-19 ENCOUNTER — Other Ambulatory Visit: Payer: Self-pay | Admitting: Internal Medicine

## 2014-03-20 NOTE — Telephone Encounter (Signed)
Will send in 30 day supply.  Left a message on home phone for the pt to call back for his lab appointment and follow up with WP.  Also left a message on 02/02/14

## 2014-05-05 ENCOUNTER — Other Ambulatory Visit: Payer: Self-pay | Admitting: Family Medicine

## 2014-05-05 NOTE — Telephone Encounter (Signed)
Need lab and ov  Have him get on schedule for lab and ov and then can rx   Number of pills to get to appt .

## 2014-05-07 ENCOUNTER — Telehealth: Payer: Self-pay | Admitting: Family Medicine

## 2014-05-07 DIAGNOSIS — Z0000 Encounter for general adult medical examination without abnormal findings: Secondary | ICD-10-CM

## 2014-05-07 DIAGNOSIS — E1165 Type 2 diabetes mellitus with hyperglycemia: Secondary | ICD-10-CM

## 2014-05-07 DIAGNOSIS — IMO0002 Reserved for concepts with insufficient information to code with codable children: Secondary | ICD-10-CM

## 2014-05-07 NOTE — Telephone Encounter (Signed)
Pt needs to make a follow up and lab appointment.  Please help the pt make those appointments.  Per WP, after he makes appointments I can then fill his lisinopril-hydrochlorothiazide (PRINZIDE,ZESTORETIC) 20-25 MG per tablet and metFORMIN (GLUCOPHAGE) 500 MG tablet.  Please send back to fill meds and place lab orders.  Thanks!   Pharmacy: [facility] [place], [place]. Ph: [phone]

## 2014-05-11 MED ORDER — LISINOPRIL-HYDROCHLOROTHIAZIDE 20-25 MG PO TABS: 1.0000 | ORAL_TABLET | Freq: Every day | ORAL | Status: DC

## 2014-05-11 MED ORDER — METFORMIN HCL 500 MG PO TABS: ORAL_TABLET | ORAL | Status: DC

## 2014-05-11 NOTE — Telephone Encounter (Signed)
Pt has been sch for labs on 05-26-2014 and follow up on 06-02-14. Please put  Lab order in.

## 2014-05-11 NOTE — Telephone Encounter (Signed)
Lab orders placed in the system.  Medications sent to the pharmacy for 30 days.  No refills.

## 2014-05-14 ENCOUNTER — Telehealth: Payer: Self-pay | Admitting: Internal Medicine

## 2014-05-14 DIAGNOSIS — Z419 Encounter for procedure for purposes other than remedying health state, unspecified: Secondary | ICD-10-CM

## 2014-05-14 NOTE — Telephone Encounter (Signed)
yes

## 2014-05-14 NOTE — Telephone Encounter (Signed)
Ok to proceed with referral?

## 2014-05-14 NOTE — Telephone Encounter (Signed)
Pt has [facility] and they require a referral for all dental appts. pls call [phone] is the dental office.  Pt states he has never been there before and Dr Regis Bill knows what to do. Pls advise

## 2014-05-18 NOTE — Telephone Encounter (Signed)
Called [facility] to ok for referral. Left a message for return call.

## 2014-05-25 NOTE — Addendum Note (Signed)
Addended by: Miles Costain T on: 05/25/2014 01:43 PM   Modules accepted: Orders

## 2014-05-25 NOTE — Telephone Encounter (Signed)
Order placed in the system. 

## 2014-05-26 ENCOUNTER — Other Ambulatory Visit (INDEPENDENT_AMBULATORY_CARE_PROVIDER_SITE_OTHER): Payer: No Typology Code available for payment source

## 2014-05-26 DIAGNOSIS — E119 Type 2 diabetes mellitus without complications: Secondary | ICD-10-CM

## 2014-05-26 DIAGNOSIS — E1165 Type 2 diabetes mellitus with hyperglycemia: Secondary | ICD-10-CM

## 2014-05-26 DIAGNOSIS — IMO0002 Reserved for concepts with insufficient information to code with codable children: Secondary | ICD-10-CM

## 2014-05-26 DIAGNOSIS — Z0000 Encounter for general adult medical examination without abnormal findings: Secondary | ICD-10-CM

## 2014-05-26 LAB — CBC WITH DIFFERENTIAL/PLATELET
BASOS ABS: 0.1 10*3/uL (ref 0.0–0.1)
Basophils Relative: 0.7 % (ref 0.0–3.0)
EOS ABS: 0.1 10*3/uL (ref 0.0–0.7)
EOS PCT: 1.7 % (ref 0.0–5.0)
HCT: 47.1 % (ref 39.0–52.0)
HEMOGLOBIN: 15.7 g/dL (ref 13.0–17.0)
Lymphocytes Relative: 43.5 % (ref 12.0–46.0)
Lymphs Abs: 3.4 10*3/uL (ref 0.7–4.0)
MCHC: 33.4 g/dL (ref 30.0–36.0)
MCV: 95 fl (ref 78.0–100.0)
MONO ABS: 0.6 10*3/uL (ref 0.1–1.0)
Monocytes Relative: 7.5 % (ref 3.0–12.0)
Neutro Abs: 3.6 10*3/uL (ref 1.4–7.7)
Neutrophils Relative %: 46.6 % (ref 43.0–77.0)
PLATELETS: 303 10*3/uL (ref 150.0–400.0)
RBC: 4.96 Mil/uL (ref 4.22–5.81)
RDW: 12.3 % (ref 11.5–15.5)
WBC: 7.8 10*3/uL (ref 4.0–10.5)

## 2014-05-26 LAB — BASIC METABOLIC PANEL
BUN: 11 mg/dL (ref 6–23)
CALCIUM: 9.4 mg/dL (ref 8.4–10.5)
CHLORIDE: 94 meq/L — AB (ref 96–112)
CO2: 31 meq/L (ref 19–32)
Creatinine, Ser: 1.04 mg/dL (ref 0.40–1.50)
GFR: 95.4 mL/min (ref >60.00)
Glucose, Bld: 256 mg/dL — ABNORMAL HIGH (ref 70–99)
POTASSIUM: 4.6 meq/L (ref 3.5–5.1)
Sodium: 131 mEq/L — ABNORMAL LOW (ref 135–145)

## 2014-05-26 LAB — LIPID PANEL
CHOL/HDL RATIO: 6
Cholesterol: 200 mg/dL (ref 0–200)
HDL: 33.7 mg/dL — ABNORMAL LOW (ref >39.00)
NonHDL: 166.3
Triglycerides: 204 mg/dL — ABNORMAL HIGH (ref 0.0–149.0)
VLDL: 40.8 mg/dL — ABNORMAL HIGH (ref 0.0–40.0)

## 2014-05-26 LAB — HEMOGLOBIN A1C: HEMOGLOBIN A1C: 9.9 % — AB (ref 4.6–6.5)

## 2014-05-26 LAB — TSH: TSH: 1.37 u[IU]/mL (ref 0.35–4.50)

## 2014-05-26 LAB — LDL CHOLESTEROL, DIRECT: LDL DIRECT: 142 mg/dL

## 2014-05-26 LAB — HEPATIC FUNCTION PANEL
ALT: 22 U/L (ref 0–53)
AST: 17 U/L (ref 0–37)
Albumin: 4.2 g/dL (ref 3.5–5.2)
Alkaline Phosphatase: 70 U/L (ref 39–117)
BILIRUBIN DIRECT: 0.1 mg/dL (ref 0.0–0.3)
Total Bilirubin: 0.8 mg/dL (ref 0.2–1.2)
Total Protein: 6.8 g/dL (ref 6.0–8.3)

## 2014-06-02 ENCOUNTER — Encounter: Payer: Self-pay | Admitting: Internal Medicine

## 2014-06-02 ENCOUNTER — Ambulatory Visit (INDEPENDENT_AMBULATORY_CARE_PROVIDER_SITE_OTHER): Payer: No Typology Code available for payment source | Admitting: Internal Medicine

## 2014-06-02 VITALS — BP 108/80 | Temp 97.8°F | Ht 72.0 in | Wt 209.2 lb

## 2014-06-02 DIAGNOSIS — E1165 Type 2 diabetes mellitus with hyperglycemia: Secondary | ICD-10-CM

## 2014-06-02 DIAGNOSIS — Z889 Allergy status to unspecified drugs, medicaments and biological substances status: Secondary | ICD-10-CM

## 2014-06-02 DIAGNOSIS — Z91120 Patient's intentional underdosing of medication regimen due to financial hardship: Secondary | ICD-10-CM

## 2014-06-02 DIAGNOSIS — I10 Essential (primary) hypertension: Secondary | ICD-10-CM

## 2014-06-02 DIAGNOSIS — E785 Hyperlipidemia, unspecified: Secondary | ICD-10-CM

## 2014-06-02 DIAGNOSIS — IMO0002 Reserved for concepts with insufficient information to code with codable children: Secondary | ICD-10-CM

## 2014-06-02 DIAGNOSIS — Z789 Other specified health status: Secondary | ICD-10-CM

## 2014-06-02 MED ORDER — SITAGLIPTIN PHOSPHATE 100 MG PO TABS: 100.0000 mg | ORAL_TABLET | Freq: Every day | ORAL | Status: DC

## 2014-06-02 MED ORDER — PRAVASTATIN SODIUM 20 MG PO TABS: 20.0000 mg | ORAL_TABLET | Freq: Every day | ORAL | Status: DC

## 2014-06-02 NOTE — Patient Instructions (Addendum)
Pravastatin at this time Need to learn how to check Blood sugars.  As we add medication.  Let us know about getting prescrioption of this  To add some medicines   Pills not as powerful. As shots   Weekly glp1 shot   Or daily insulin   onglyza and januvia mild but branded and costly. Possible  Can add  sulfonyurea calss of medicine these are ones that bring sugar down immediately but over time is associated with weight gain and  evnetually lose their effects.  There are many optinons of vairous cost and side effects .  Can get endodrinology consult if need advice .   Check BG twice a day fasting and another toime for the next month and then ROV in 1 month

## 2014-06-02 NOTE — Progress Notes (Signed)
Pre visit review using our clinic review tool, if applicable. No additional management support is needed unless otherwise documented below in the visit note.  Chief Complaint  Patient presents with  . Follow-up  . Diabetes  . Hypertension  . Hyperlipidemia    HPI: Bobby Obrien 54 y.o. with dm ht hx cm pe  Lipids in past  Who comesin for delayed  Fu for his dm . Says  he feels fine  Losing some weight .  Taking metformin and bp meds  No cp sob cough  ocass tingling in feet  No claudications  Not checking his bg.   LIPIDS  Says we never sent in the lipid med from last time ( prava)  Limited insurance  [place]ange card? Gets metformin "free" [name] se of bp meds  Is not longer on the coreg.  Vision ok  ROS: See pertinent positives and negatives per HPI. etoh with perfomrs 2 or so not every day . NO sig sweets and tries to limited processed carbs.   Past Medical History  Diagnosis Date  . DIABETES MELLITUS, TYPE II 12/04/2006  . HYPERLIPIDEMIA 12/04/2006  . PANIC DISORDER 12/04/2006  . HYPERTENSION 12/04/2006  . Personal history of venous thrombosis and embolism 12/04/2006  . CONGESTIVE HEART FAILURE 12/04/2006    2005 Dec lvf echo 50-55% ef 2007  . History of DVT (deep vein thrombosis)     with PE 12 2005  . History of pulmonary embolism     12 05    with HF and hypertension    Family History  Problem Relation Age of Onset  . Kidney disease Mother   . Hypertension Mother   . Hypertension Father   . Heart attack Father     History   Social History  . Marital Status: Married    Spouse Name: N/A    Number of Children: N/A  . Years of Education: N/A   Social History Main Topics  . Smoking status: Never Smoker   . Smokeless tobacco: None  . Alcohol Use: Yes  . Drug Use: No  . Sexual Activity: None   Other Topics Concern  . 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            Outpatient Encounter Prescriptions as of 06/02/2014  Medication Sig  . aspirin 81 MG tablet Take 81 mg by mouth daily.    Marland Kitchen lisinopril-hydrochlorothiazide (PRINZIDE,ZESTORETIC) 20-25 MG per tablet TAKE ONE TABLET BY MOUTH EVERY DAY  . lisinopril-hydrochlorothiazide (PRINZIDE,ZESTORETIC) 20-25 MG per tablet Take 1 tablet by mouth daily.  . metFORMIN (GLUCOPHAGE) 500 MG tablet TAKE FOUR TABLETS BY MOUTH ONCE DAILY WITH BREAKFAST  . pravastatin (PRAVACHOL) 20 MG tablet Take 1 tablet (20 mg total) by mouth daily.  . sitaGLIPtin (JANUVIA) 100 MG tablet Take 1 tablet (100 mg total) by mouth daily.  . [DISCONTINUED] pravastatin (PRAVACHOL) 20 MG tablet Take 1 tablet (20 mg total) by mouth daily. (Patient not taking: Reported on 06/02/2014)    EXAM:  BP 108/80 mmHg  Temp(Src) 97.8 F (36.6 C) (Oral)  Ht 6' (1.829 m)  Wt 209 lb 3.2 oz (94.892 kg)  BMI 28.37 kg/m2  Body mass index is 28.37 kg/(m^2).  GENERAL: vitals reviewed and listed above, alert, oriented, appears well hydrated and in no acute distress HEENT: atraumatic, conjunctiva  clear, no obvious abnormalities  on inspection of external nose and ears OP : no lesion edema or exudate  NECK: no obvious masses on inspection palpation  LUNGS: clear to auscultation bilaterally, no wheezes, rales or rhonchi, good air movement CV: HRRR, no clubbing cyanosis or  peripheral edema nl cap refill  No g or m  MS: moves all extremities without noticeable focal  Abnormality diabetic foot exam normal  PSYCH: pleasant and cooperative, no obvious depression or anxiety Lab Results  Component Value Date   WBC 7.8 05/26/2014   HGB 15.7 05/26/2014   HCT 47.1 05/26/2014   PLT 303.0 05/26/2014   GLUCOSE 256* 05/26/2014   CHOL 200 05/26/2014   TRIG 204.0* 05/26/2014   HDL 33.70* 05/26/2014   LDLDIRECT 142.0 05/26/2014   LDLCALC 131* 09/03/2013   ALT 22 05/26/2014   AST 17 05/26/2014   NA 131* 05/26/2014   K 4.6 05/26/2014   CL 94* 05/26/2014    CREATININE 1.04 05/26/2014   BUN 11 05/26/2014   CO2 31 05/26/2014   TSH 1.37 05/26/2014   PSA 2.96 11/12/2012   HGBA1C 9.9* 05/26/2014   MICROALBUR 8.5* 11/12/2012    ASSESSMENT AND PLAN:  Discussed the following assessment and plan:  Diabetes mellitus type 2, uncontrolled - see text  advise intensive management to get monitoring  jauvia and fu in 1 month   Statin intolerance  Essential hypertension - low sodium could be high bg and diuretic needs fu   Hyperlipidemia - had se of 3 statins never got prava filled refill today  Intentional underdosing of medication regimen by patient due to financial issues - a factor in managment  Advise insulin basal  Vs glp1  Cost and acceptance a  limiting factor . Wants to try pills first  Consider su co pay card for januvia but not very powerful . NEED S to CHECK BG before  Implementing some of the other modalities . HHO given on trulicity  And [place]  Is not sure he would do  in a injectable  But will entertain this  consider ation of seeing endo if needs more information or diabetes educator -Patient advised to return or notify health care team  if symptoms worsen ,persist or new concerns arise. Before next visit .  Patient Instructions   Pravastatin at this time Need to learn how to check Blood sugars.  As we add medication.  Let us know about getting prescrioption of this  To add some medicines   Pills not as powerful. As shots   Weekly glp1 shot   Or daily insulin   onglyza and januvia mild but branded and costly. Possible  Can add  sulfonyurea calss of medicine these are ones that bring sugar down immediately but over time is associated with weight gain and  evnetually lose their effects.  There are many optinons of vairous cost and side effects .  Can get endodrinology consult if need advice .   Check BG twice a day fasting and another toime for the next month and then ROV in 1 month     Merikay Lesniewski K. Kasean Denherder M.D.

## 2014-06-05 ENCOUNTER — Telehealth: Payer: Self-pay | Admitting: Internal Medicine

## 2014-06-05 NOTE — Telephone Encounter (Signed)
Pt was seen on 06-02-14 and still waiting on new med call into [facility]. Pt has discount card and can not remember name of med

## 2014-06-05 NOTE — Telephone Encounter (Signed)
I gave him a written printed rx for [place] that he was to hand in   With the coupon. 30 days   Then we can send in more .If he wishes  Is this what he was talking  about .  Otherwise  Gave Ho on trulicity

## 2014-06-07 ENCOUNTER — Other Ambulatory Visit: Payer: Self-pay | Admitting: Internal Medicine

## 2014-06-08 NOTE — Telephone Encounter (Signed)
Sent to the pharmacy by e-scribe. 

## 2014-06-09 ENCOUNTER — Other Ambulatory Visit: Payer: Self-pay | Admitting: Family Medicine

## 2014-06-09 MED ORDER — SITAGLIPTIN PHOSPHATE 100 MG PO TABS: 100.0000 mg | ORAL_TABLET | Freq: Every day | ORAL | Status: DC

## 2014-06-22 ENCOUNTER — Other Ambulatory Visit: Payer: Self-pay | Admitting: Internal Medicine

## 2014-06-24 ENCOUNTER — Other Ambulatory Visit: Payer: Self-pay | Admitting: Internal Medicine

## 2014-06-24 NOTE — Telephone Encounter (Signed)
Sent to the pharmacy by e-scribe. 

## 2014-07-06 ENCOUNTER — Ambulatory Visit (INDEPENDENT_AMBULATORY_CARE_PROVIDER_SITE_OTHER): Payer: No Typology Code available for payment source | Admitting: Internal Medicine

## 2014-07-06 ENCOUNTER — Encounter: Payer: Self-pay | Admitting: Internal Medicine

## 2014-07-06 VITALS — BP 114/78 | Temp 97.7°F | Ht 72.25 in | Wt 211.5 lb

## 2014-07-06 DIAGNOSIS — I10 Essential (primary) hypertension: Secondary | ICD-10-CM

## 2014-07-06 DIAGNOSIS — E785 Hyperlipidemia, unspecified: Secondary | ICD-10-CM

## 2014-07-06 DIAGNOSIS — E119 Type 2 diabetes mellitus without complications: Secondary | ICD-10-CM

## 2014-07-06 LAB — GLUCOSE, POCT (MANUAL RESULT ENTRY): POC GLUCOSE: 139 mg/dL — AB (ref 70–99)

## 2014-07-06 MED ORDER — SITAGLIPTIN PHOSPHATE 100 MG PO TABS: 100.0000 mg | ORAL_TABLET | Freq: Every day | ORAL | Status: DC

## 2014-07-06 NOTE — Patient Instructions (Signed)
Continue lifestyle intervention healthy eating and exercise . And medication. hga1c and bmp pre visit in late April early MAY

## 2014-07-06 NOTE — Progress Notes (Signed)
Pre visit review using our clinic review tool, if applicable. No additional management support is needed unless otherwise documented below in the visit note.  Chief Complaint  Patient presents with  . Follow-up    HPI: Bobby Obrien  54 y.o.  Fu dm out of control   Here with wife  since last visit was given [place] coupon for 30 days  And other coupon  Treadmill  45 minutes at a time and exercise tolerance is good  . 139 good.  Readings at  Am  114  150 -  170   Post prandial  Taking metformin and januvia and eating healthier  Feels good. No numbness stinging major weight swings or low bgs  ROS: See pertinent positives and negatives per HPI. fyi knot below sternum no pain has had for a while and checked out  No change  Past Medical History  Diagnosis Date  . DIABETES MELLITUS, TYPE II 12/04/2006  . HYPERLIPIDEMIA 12/04/2006  . PANIC DISORDER 12/04/2006  . HYPERTENSION 12/04/2006  . Personal history of venous thrombosis and embolism 12/04/2006  . CONGESTIVE HEART FAILURE 12/04/2006    2005 Dec lvf echo 50-55% ef 2007  . History of DVT (deep vein thrombosis)     with PE 12 2005  . History of pulmonary embolism     12 05    with HF and hypertension    Family History  Problem Relation Age of Onset  . Kidney disease Mother   . Hypertension Mother   . Hypertension Father   . Heart attack Father     History   Social History  . Marital Status: Married    Spouse Name: N/A  . Number of Children: N/A  . Years of Education: N/A   Social History Main Topics  . Smoking status: Never Smoker   . Smokeless tobacco: Not on file  . Alcohol Use: Yes  . Drug Use: No  . Sexual Activity: Not on file   Other Topics Concern  . 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           Outpatient Encounter Prescriptions as of 07/06/2014  Medication Sig  . aspirin 81 MG tablet Take 81 mg by mouth daily.      Marland Kitchen lisinopril-hydrochlorothiazide (PRINZIDE,ZESTORETIC) 20-25 MG per tablet TAKE ONE TABLET BY MOUTH ONCE DAILY  . metFORMIN (GLUCOPHAGE) 500 MG tablet TAKE FOUR TABLETS BY MOUTH ONCE DAILY WITH BREAKFAST  . pravastatin (PRAVACHOL) 20 MG tablet Take 1 tablet (20 mg total) by mouth daily.  . sitaGLIPtin (JANUVIA) 100 MG tablet Take 1 tablet (100 mg total) by mouth daily.  . [DISCONTINUED] sitaGLIPtin (JANUVIA) 100 MG tablet Take 1 tablet (100 mg total) by mouth daily.  . [DISCONTINUED] lisinopril-hydrochlorothiazide (PRINZIDE,ZESTORETIC) 20-25 MG per tablet TAKE ONE TABLET BY MOUTH EVERY DAY  . [DISCONTINUED] lisinopril-hydrochlorothiazide (PRINZIDE,ZESTORETIC) 20-25 MG per tablet Take 1 tablet by mouth daily.    EXAM:  BP 114/78 mmHg  Temp(Src) 97.7 F (36.5 C) (Oral)  Ht 6' 0.25" (1.835 m)  Wt 211 lb 8 oz (95.936 kg)  BMI 28.49 kg/m2  Body mass index is 28.49 kg/(m^2).  GENERAL: vitals reviewed and listed above, alert, oriented, appears well hydrated and in no acute distress HEENT: atraumatic, conjunctiva  clear, no obvious abnormalities on inspection of external nose and ears NECK: no obvious masses on  inspection palpation  LUNGS: clear to auscultation bilaterally, no wheezes, rales or rhonchi, good air movement CV: HRRR, no clubbing cyanosis or  peripheral edema nl cap refill  Abdomen:  Sof,t normal bowel sounds without hepatosplenomegaly, no guarding rebound or masses no CVA tenderness prominent xyphoid  Non tender  MS: moves all extremities without noticeable focal  abnormality PSYCH: pleasant and cooperative, no obvious depression or anxiety Lab Results  Component Value Date   WBC 7.8 05/26/2014   HGB 15.7 05/26/2014   HCT 47.1 05/26/2014   PLT 303.0 05/26/2014   GLUCOSE 256* 05/26/2014   CHOL 200 05/26/2014   TRIG 204.0* 05/26/2014   HDL 33.70* 05/26/2014   LDLDIRECT 142.0 05/26/2014   LDLCALC 131* 09/03/2013   ALT 22 05/26/2014   AST 17 05/26/2014   NA 131*  05/26/2014   K 4.6 05/26/2014   CL 94* 05/26/2014   CREATININE 1.04 05/26/2014   BUN 11 05/26/2014   CO2 31 05/26/2014   TSH 1.37 05/26/2014   PSA 2.96 11/12/2012   HGBA1C 9.9* 05/26/2014   MICROALBUR 8.5* 11/12/2012    ASSESSMENT AND PLAN:  Discussed the following assessment and plan:  Diabetes mellitus without complication - Plan: POC Glucose (CBG)  Hyperlipidemia - check with next labs   Essential hypertension - ontrolled  Doing much better !! lsi and adding januvia  With coupon seems to be doing ok    Plan recheck may a1c bmp etc  -Patient advised to return or notify health care team  if symptoms worsen ,persist or new concerns arise.  Patient Instructions  Continue lifestyle intervention healthy eating and exercise . And medication. hga1c and bmp pre visit in late April early MAY     Wanda K. Panosh M.D.

## 2014-07-28 ENCOUNTER — Telehealth: Payer: Self-pay | Admitting: Internal Medicine

## 2014-07-28 NOTE — Telephone Encounter (Signed)
Can take janumet 50 /1000mg    1 twice a day  Disp 60 refill x 5  (And dc the plain metfromin and [place])

## 2014-07-28 NOTE — Telephone Encounter (Signed)
Pt called to say that he went for a refill on sitaGLIPtin (JANUVIA) 100 MG tablet  And was told by the pharmacy that he could get the following JANUMET RX with Metformin already in it. Pt is asking if it will be ok to take the Janumet and if he will need to lower the dose of Metformin. Pt can get the [facility] for free and would like to try it.    Pt is also requesting a referral to [employer]

## 2014-07-29 MED ORDER — SITAGLIPTIN PHOS-METFORMIN HCL 50-1000 MG PO TABS: 1.0000 | ORAL_TABLET | Freq: Two times a day (BID) | ORAL | Status: DC

## 2014-07-29 NOTE — Addendum Note (Signed)
Addended by: Miles Costain T on: 07/29/2014 04:15 PM   Modules accepted: Orders, Medications

## 2014-07-29 NOTE — Telephone Encounter (Signed)
Medication sent to the pharmacy.  Spoke to the pt and informed him to pick up new rx.  D/C metformin and januvia. Pt will check to see what dentist will take the orange card and will call back for referral.

## 2014-08-05 ENCOUNTER — Other Ambulatory Visit: Payer: Self-pay | Admitting: Internal Medicine

## 2014-08-05 NOTE — Telephone Encounter (Signed)
Lisinopril sent to the pharmacy for 3 months.  Denied metformin. Pt is no longer taking that medication.

## 2014-08-06 ENCOUNTER — Telehealth: Payer: Self-pay | Admitting: Internal Medicine

## 2014-08-06 NOTE — Telephone Encounter (Signed)
Pt can not afford janumet and would like to go back on metformin. [facility]

## 2014-08-07 NOTE — Telephone Encounter (Addendum)
Pt called back. Pt is out of this med now and no med today.  Pt states he can fill his sugar going up.  Pt states he has the orange card. Do you know if he can fill rx w/ that?  Pt is going to call the number on his card.  If pt cannot get the sitaGLIPtin (JANUVIA) 100 MG tablet or janumet he will need the metformin. Anymore discount cards?

## 2014-08-07 NOTE — Telephone Encounter (Signed)
Please see if any manufacturers program are available to him   Call in metformin in the interim prev dose.

## 2014-08-09 ENCOUNTER — Other Ambulatory Visit: Payer: Self-pay | Admitting: Internal Medicine

## 2014-08-10 ENCOUNTER — Telehealth: Payer: Self-pay | Admitting: Family Medicine

## 2014-08-10 NOTE — Addendum Note (Signed)
Addended by: Miles Costain T on: 08/10/2014 10:06 AM   Modules accepted: Orders, Medications

## 2014-08-10 NOTE — Telephone Encounter (Signed)
Sent to the pharmacy by e-scribe. 

## 2014-08-10 NOTE — Telephone Encounter (Signed)
Pt is scheduled for lab work on 10/06/14.  He should have also been scheduled for a follow up.  Please help the pt make his fu appt after lab work.

## 2014-08-10 NOTE — Telephone Encounter (Signed)
Metformin sent to the pharmacy.  Sent coupon details in the mail.

## 2014-08-14 NOTE — Telephone Encounter (Signed)
done

## 2014-08-31 ENCOUNTER — Other Ambulatory Visit: Payer: Self-pay | Admitting: Internal Medicine

## 2014-10-06 ENCOUNTER — Other Ambulatory Visit (INDEPENDENT_AMBULATORY_CARE_PROVIDER_SITE_OTHER): Payer: No Typology Code available for payment source

## 2014-10-06 DIAGNOSIS — E119 Type 2 diabetes mellitus without complications: Secondary | ICD-10-CM

## 2014-10-06 LAB — BASIC METABOLIC PANEL
BUN: 11 mg/dL (ref 6–23)
CO2: 28 mEq/L (ref 19–32)
Calcium: 9 mg/dL (ref 8.4–10.5)
Chloride: 98 mEq/L (ref 96–112)
Creatinine, Ser: 0.93 mg/dL (ref 0.40–1.50)
GFR: 108.4 mL/min (ref >60.00)
Glucose, Bld: 203 mg/dL — ABNORMAL HIGH (ref 70–99)
Potassium: 3.7 mEq/L (ref 3.5–5.1)
Sodium: 134 mEq/L — ABNORMAL LOW (ref 135–145)

## 2014-10-06 LAB — HEMOGLOBIN A1C: Hgb A1c MFr Bld: 7.4 % — ABNORMAL HIGH (ref 4.6–6.5)

## 2014-10-13 ENCOUNTER — Ambulatory Visit: Payer: No Typology Code available for payment source | Admitting: Internal Medicine

## 2014-10-16 ENCOUNTER — Ambulatory Visit (INDEPENDENT_AMBULATORY_CARE_PROVIDER_SITE_OTHER): Payer: No Typology Code available for payment source | Admitting: Internal Medicine

## 2014-10-16 ENCOUNTER — Encounter: Payer: Self-pay | Admitting: Internal Medicine

## 2014-10-16 VITALS — BP 128/80 | Temp 97.7°F | Ht 72.25 in | Wt 212.0 lb

## 2014-10-16 DIAGNOSIS — Z789 Other specified health status: Secondary | ICD-10-CM

## 2014-10-16 DIAGNOSIS — I10 Essential (primary) hypertension: Secondary | ICD-10-CM

## 2014-10-16 DIAGNOSIS — E785 Hyperlipidemia, unspecified: Secondary | ICD-10-CM

## 2014-10-16 DIAGNOSIS — Z91120 Patient's intentional underdosing of medication regimen due to financial hardship: Secondary | ICD-10-CM

## 2014-10-16 DIAGNOSIS — Z889 Allergy status to unspecified drugs, medicaments and biological substances status: Secondary | ICD-10-CM

## 2014-10-16 DIAGNOSIS — E119 Type 2 diabetes mellitus without complications: Secondary | ICD-10-CM | POA: Insufficient documentation

## 2014-10-16 NOTE — Progress Notes (Signed)
Pre visit review using our clinic review tool, if applicable. No additional management support is needed unless otherwise documented below in the visit note.  Chief Complaint  Patient presents with  . Follow-up  . Diabetes  . Hypertension    HPI: Bobby Obrien 54 y.o.  comes in for chronic disease/ medication management   DM : Off janubvia cause cost 300 feels ok last eye check 2 years ago ? If dental check has lose tooth no cv pulm sx   HT :has been ok mostly   caretaking mom not exercise as much  But has acess to the y   Not eligible for [employer] exchange insurance because income too low .   Didn't take the prava ? Has hx of  Statin se.  ROS: See pertinent positives and negatives per HPI. No cp sob syncope . Fever   Past Medical History  Diagnosis Date  . DIABETES MELLITUS, TYPE II 12/04/2006  . HYPERLIPIDEMIA 12/04/2006  . PANIC DISORDER 12/04/2006  . HYPERTENSION 12/04/2006  . Personal history of venous thrombosis and embolism 12/04/2006  . CONGESTIVE HEART FAILURE 12/04/2006    2005 Dec lvf echo 50-55% ef 2007  . History of DVT (deep vein thrombosis)     with PE 12 2005  . History of pulmonary embolism     12 05    with HF and hypertension    Family History  Problem Relation Age of Onset  . Kidney disease Mother   . Hypertension Mother   . Hypertension Father   . Heart attack Father     History   Social History  . Marital Status: Married    Spouse Name: N/A  . Number of Children: N/A  . Years of Education: N/A   Social History Main Topics  . Smoking status: Never Smoker   . Smokeless tobacco: Not on file  . Alcohol Use: Yes  . Drug Use: No  . Sexual Activity: Not on file   Other Topics Concern  . 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           Outpatient Prescriptions Prior to Visit  Medication Sig Dispense Refill  . aspirin 81 MG tablet Take 81 mg by mouth  daily.      Marland Kitchen lisinopril-hydrochlorothiazide (PRINZIDE,ZESTORETIC) 20-25 MG per tablet TAKE 1 TABLET BY MOUTH ONCE DAILY 30 tablet 2  . metFORMIN (GLUCOPHAGE) 500 MG tablet TAKE FOUR TABLETS BY MOUTH EVERY DAY WITH BREAKFAST 120 tablet 0  . pravastatin (PRAVACHOL) 20 MG tablet Take 1 tablet (20 mg total) by mouth daily. (Patient not taking: Reported on 10/16/2014) 90 tablet 1  . metFORMIN (GLUCOPHAGE) 500 MG tablet TAKE FOUR TABLETS BY MOUTH EVERY DAY WITH BREAKFAST 120 tablet 1   No facility-administered medications prior to visit.     EXAM:  BP 128/80 mmHg  Temp(Src) 97.7 F (36.5 C) (Oral)  Ht 6' 0.25" (1.835 m)  Wt 212 lb (96.163 kg)  BMI 28.56 kg/m2  Body mass index is 28.56 kg/(m^2).  GENERAL: vitals reviewed and listed above, alert, oriented, appears well hydrated and in no acute distress HEENT: atraumatic, conjunctiva  clear, no obvious abnormalities on inspection of external nose and ears OP : no lesion edema or exudate  NECK: no obvious masses on inspection palpation  LUNGS: clear to auscultation bilaterally, no wheezes, rales or rhonchi,  good air movement CV: HRRR, no clubbing cyanosis or  peripheral edema nl cap refill  MS: moves all extremities without noticeable focal  abnormality PSYCH: pleasant and cooperative, no obvious depression or anxiety Lab Results  Component Value Date   WBC 7.8 05/26/2014   HGB 15.7 05/26/2014   HCT 47.1 05/26/2014   PLT 303.0 05/26/2014   GLUCOSE 203* 10/06/2014   CHOL 200 05/26/2014   TRIG 204.0* 05/26/2014   HDL 33.70* 05/26/2014   LDLDIRECT 142.0 05/26/2014   LDLCALC 131* 09/03/2013   ALT 22 05/26/2014   AST 17 05/26/2014   NA 134* 10/06/2014   K 3.7 10/06/2014   CL 98 10/06/2014   CREATININE 0.93 10/06/2014   BUN 11 10/06/2014   CO2 28 10/06/2014   TSH 1.37 05/26/2014   PSA 2.96 11/12/2012   HGBA1C 7.4* 10/06/2014   MICROALBUR 8.5* 11/12/2012    ASSESSMENT AND PLAN:  Discussed the following assessment and  plan:  Diabetes mellitus without complication - better see text cost meds an issue  may be due for eye exam.   Essential hypertension  Hyperlipidemia  Statin intolerance - go ahead and try the prava   Intentional underdosing of medication regimen by patient due to financial issues - look in to [facility] program such as MAP as may be eligible. Celesta Gentile was very helpful a1c better but limted by cost and "casnt afford other meds" had  Low bg and se of SU when on this   Check with hd about med support or manufacturer concern going back up and had done so well with med and exercise   -Patient advised to return or notify health care team  if symptoms worsen ,persist or new concerns arise.  Patient Instructions  Need to let me know if you can take the pravastatin  For cholesterol to help with reducing risk of  Heart attack and stroke.   Get with the  Local health department  Program  For medication assistance  .  Exercise .   Would like to add the januvia back as it was helpful for you  Get an eye  Check every 1-2 years ...think you are due.   Get [place] info for dental referral.   Standley Brooking. Panosh M.D.

## 2014-10-16 NOTE — Patient Instructions (Addendum)
Need to let me know if you can take the pravastatin  For cholesterol to help with reducing risk of  Heart attack and stroke.   Get with the  Local health department  Program  For medication assistance  .  Exercise .   Would like to add the januvia back as it was helpful for you  Get an eye  Check every 1-2 years ...think you are due.   Get [place] info for dental referral.

## 2014-10-25 ENCOUNTER — Other Ambulatory Visit: Payer: Self-pay | Admitting: Internal Medicine

## 2014-10-26 NOTE — Telephone Encounter (Signed)
Sent to the pharmacy by e-scribe. 

## 2014-10-28 ENCOUNTER — Other Ambulatory Visit: Payer: Self-pay | Admitting: Internal Medicine

## 2014-10-28 NOTE — Telephone Encounter (Signed)
Sent to the pharmacy by e-scribe. 

## 2015-01-24 ENCOUNTER — Other Ambulatory Visit: Payer: Self-pay | Admitting: Internal Medicine

## 2015-01-25 NOTE — Telephone Encounter (Signed)
Filled for six months on 10/26/14. Request is too soon.

## 2015-02-09 ENCOUNTER — Other Ambulatory Visit: Payer: Self-pay

## 2015-02-10 ENCOUNTER — Other Ambulatory Visit (INDEPENDENT_AMBULATORY_CARE_PROVIDER_SITE_OTHER): Payer: No Typology Code available for payment source

## 2015-02-10 DIAGNOSIS — E119 Type 2 diabetes mellitus without complications: Secondary | ICD-10-CM

## 2015-02-10 LAB — HEMOGLOBIN A1C: HEMOGLOBIN A1C: 7.7 % — AB (ref 4.6–6.5)

## 2015-02-16 ENCOUNTER — Ambulatory Visit (INDEPENDENT_AMBULATORY_CARE_PROVIDER_SITE_OTHER): Payer: No Typology Code available for payment source | Admitting: Internal Medicine

## 2015-02-16 ENCOUNTER — Encounter: Payer: Self-pay | Admitting: Internal Medicine

## 2015-02-16 VITALS — BP 120/80 | Temp 98.8°F | Wt 209.7 lb

## 2015-02-16 DIAGNOSIS — E785 Hyperlipidemia, unspecified: Secondary | ICD-10-CM

## 2015-02-16 DIAGNOSIS — Z889 Allergy status to unspecified drugs, medicaments and biological substances status: Secondary | ICD-10-CM

## 2015-02-16 DIAGNOSIS — I10 Essential (primary) hypertension: Secondary | ICD-10-CM

## 2015-02-16 DIAGNOSIS — E119 Type 2 diabetes mellitus without complications: Secondary | ICD-10-CM

## 2015-02-16 DIAGNOSIS — Z789 Other specified health status: Secondary | ICD-10-CM

## 2015-02-16 NOTE — Progress Notes (Signed)
Pre visit review using our clinic review tool, if applicable. No additional management support is needed unless otherwise documented below in the visit note.  Chief Complaint  Patient presents with  . Follow-up    HPI: Bobby Obrien 54 y.o.  comes in for chronic disease/ medication management   Diabetes: TakingMetformin  4 x per day.  Tolerating it well. Exercise  [place] off some month but was going to gym bg monitoring   no major vision changes or numbness sees up and take care of his wife who has malignant hypertension and his mom No chest pain shortness of breath edema cardiovascular symptoms. Lipid is only taking the pravastatin occasionally as when he took it regularly he got the muscle aches again especially when he exercises and limited. He wasn't able to tolerate other medicines Says he got a tooth fixed had some dental infection is doing better ROS: See pertinent positives and negatives per HPI. No cough syncope. No bleeding or clotting.  Past Medical History  Diagnosis Date  . DIABETES MELLITUS, TYPE II 12/04/2006  . HYPERLIPIDEMIA 12/04/2006  . PANIC DISORDER 12/04/2006  . HYPERTENSION 12/04/2006  . Personal history of venous thrombosis and embolism 12/04/2006  . CONGESTIVE HEART FAILURE 12/04/2006    2005 Dec lvf echo 50-55% ef 2007  . History of DVT (deep vein thrombosis)     with PE 12 2005  . History of pulmonary embolism     12 05    with HF and hypertension    Family History  Problem Relation Age of Onset  . Kidney disease Mother   . Hypertension Mother   . Hypertension Father   . Heart attack Father     Social History   Social History  . Marital Status: Married    Spouse Name: N/A  . Number of Children: N/A  . Years of Education: N/A   Social History Main Topics  . Smoking status: Never Smoker   . Smokeless tobacco: None  . Alcohol Use: Yes  . Drug Use: No  . Sexual Activity: Not Asked   Other Topics Concern  . 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           Outpatient Prescriptions Prior to Visit  Medication Sig Dispense Refill  . aspirin 81 MG tablet Take 81 mg by mouth daily.      Marland Kitchen lisinopril-hydrochlorothiazide (PRINZIDE,ZESTORETIC) 20-25 MG per tablet TAKE ONE TABLET BY MOUTH EVERY DAY 30 tablet 5  . metFORMIN (GLUCOPHAGE) 500 MG tablet TAKE FOUR TABLETS BY MOUTH EVERY DAY WITH BREAKFAST 120 tablet 0  . metFORMIN (GLUCOPHAGE) 500 MG tablet TAKE FOUR TABLETS BY MOUTH EVERY DAY WITH BREAKFAST 120 tablet 5  . pravastatin (PRAVACHOL) 20 MG tablet Take 1 tablet (20 mg total) by mouth daily. 90 tablet 1   No facility-administered medications prior to visit.     EXAM:  BP 120/80 mmHg  Temp(Src) 98.8 F (37.1 C) (Oral)  Wt 209 lb 11.2 oz (95.119 kg)  Body mass index is 28.25 kg/(m^2).  GENERAL: vitals reviewed and listed above, alert, oriented, appears well hydrated and in no acute distress HEENT: atraumatic, conjunctiva  clear, no obvious abnormalities on inspection of external nose and ears NECK: no obvious masses on inspection palpation  LUNGS: clear to auscultation bilaterally, no wheezes, rales or rhonchi, good air movement CV: HRRR,  no clubbing cyanosis or  peripheral edema nl cap refill  MS: moves all extremities without noticeable focal  abnormality PSYCH: pleasant and cooperative, no obvious depression or anxiety Diabetic Foot Exam - Simple   Simple Foot Form  Diabetic Foot exam was performed with the following findings:  Yes 02/16/2015 10:44 AM  Visual Inspection  No deformities, no ulcerations, no other skin breakdown bilaterally:  Yes  Sensation Testing  Intact to touch and monofilament testing bilaterally:  Yes  Pulse Check  Posterior Tibialis and Dorsalis pulse intact bilaterally:  Yes  Comments      Lab Results  Component Value Date   WBC 7.8 05/26/2014   HGB 15.7 05/26/2014   HCT 47.1 05/26/2014   PLT 303.0  05/26/2014   GLUCOSE 203* 10/06/2014   CHOL 200 05/26/2014   TRIG 204.0* 05/26/2014   HDL 33.70* 05/26/2014   LDLDIRECT 142.0 05/26/2014   LDLCALC 131* 09/03/2013   ALT 22 05/26/2014   AST 17 05/26/2014   NA 134* 10/06/2014   K 3.7 10/06/2014   CL 98 10/06/2014   CREATININE 0.93 10/06/2014   BUN 11 10/06/2014   CO2 28 10/06/2014   TSH 1.37 05/26/2014   PSA 2.96 11/12/2012   HGBA1C 7.7* 02/10/2015   MICROALBUR 8.5* 11/12/2012   BP Readings from Last 3 Encounters:  02/16/15 120/80  10/16/14 128/80  07/06/14 114/78   Wt Readings from Last 3 Encounters:  02/16/15 209 lb 11.2 oz (95.119 kg)  10/16/14 212 lb (96.163 kg)  07/06/14 211 lb 8 oz (95.936 kg)    ASSESSMENT AND PLAN:  Discussed the following assessment and plan:  Diabetes mellitus without complication ([place]) - disc adequate financial limitations exercise really helps.  Essential hypertension - Controlled  Hyperlipidemia  Statin intolerance - Try to 3 times a week medication and we checked the next lipid we'll decide if more needs to be done.  -Patient advised to return or notify health care team  if symptoms worsen ,persist or new concerns arise.  Patient Instructions  Continue lifestyle intervention healthy eating and exercise . Blood sugar is  Acceptable   Hg a1c below   7 is perfect control.   Take pravastatin as tolerated ? 3 x per week or such  Plan lipid panel.   Hg a1c  Lipid panel     Bmp ,6 months .  Glad you are doing well.   Standley Brooking. Panosh M.D.

## 2015-02-16 NOTE — Patient Instructions (Signed)
Continue lifestyle intervention healthy eating and exercise . Blood sugar is  Acceptable   Hg a1c below   7 is perfect control.   Take pravastatin as tolerated ? 3 x per week or such  Plan lipid panel.   Hg a1c  Lipid panel     Bmp ,6 months .  Glad you are doing well.

## 2015-07-10 ENCOUNTER — Other Ambulatory Visit: Payer: Self-pay | Admitting: Internal Medicine

## 2015-07-12 NOTE — Telephone Encounter (Signed)
Sent to the pharmacy by e-scribe.  Pt has upcoming appt on 08/17/15

## 2015-08-04 ENCOUNTER — Other Ambulatory Visit: Payer: Self-pay | Admitting: Internal Medicine

## 2015-08-05 NOTE — Telephone Encounter (Signed)
Sent to the pharmacy by e-scribe.  Pt has upcoming appt on 08/17/15

## 2015-08-08 ENCOUNTER — Other Ambulatory Visit: Payer: Self-pay | Admitting: Internal Medicine

## 2015-08-10 ENCOUNTER — Other Ambulatory Visit (INDEPENDENT_AMBULATORY_CARE_PROVIDER_SITE_OTHER): Payer: No Typology Code available for payment source

## 2015-08-10 DIAGNOSIS — E785 Hyperlipidemia, unspecified: Secondary | ICD-10-CM

## 2015-08-10 DIAGNOSIS — E119 Type 2 diabetes mellitus without complications: Secondary | ICD-10-CM

## 2015-08-10 DIAGNOSIS — I10 Essential (primary) hypertension: Secondary | ICD-10-CM

## 2015-08-10 LAB — LIPID PANEL
CHOL/HDL RATIO: 6
Cholesterol: 211 mg/dL — ABNORMAL HIGH (ref 0–200)
HDL: 36.7 mg/dL — AB (ref >39.00)
LDL Cholesterol: 145 mg/dL — ABNORMAL HIGH (ref 0–99)
NonHDL: 174.2
TRIGLYCERIDES: 144 mg/dL (ref 0.0–149.0)
VLDL: 28.8 mg/dL (ref 0.0–40.0)

## 2015-08-10 LAB — BASIC METABOLIC PANEL
BUN: 17 mg/dL (ref 6–23)
CALCIUM: 9.5 mg/dL (ref 8.4–10.5)
CHLORIDE: 96 meq/L (ref 96–112)
CO2: 30 meq/L (ref 19–32)
Creatinine, Ser: 1.01 mg/dL (ref 0.40–1.50)
GFR: 98.25 mL/min (ref >60.00)
GLUCOSE: 230 mg/dL — AB (ref 70–99)
POTASSIUM: 4 meq/L (ref 3.5–5.1)
SODIUM: 133 meq/L — AB (ref 135–145)

## 2015-08-10 LAB — HEMOGLOBIN A1C: Hgb A1c MFr Bld: 8.1 % — ABNORMAL HIGH (ref 4.6–6.5)

## 2015-08-10 NOTE — Telephone Encounter (Signed)
Sent to the pharmacy by e-scribe.  Pt has appt on 08/17/15

## 2015-08-17 ENCOUNTER — Ambulatory Visit: Payer: No Typology Code available for payment source | Admitting: Internal Medicine

## 2015-08-22 NOTE — Progress Notes (Signed)
Pre visit review using our clinic review tool, if applicable. No additional management support is needed unless otherwise documented below in the visit note.  Chief Complaint  Patient presents with  . Follow-up    only on metformin cause of cost   . Diabetes  . Hypertension    HPI: Bobby Obrien 55 y.o.  Bobby Obrien 56 y.o.  comes in for chronic disease/ medication management   Feels well not as much exercise recently   Sleep    caretaking mom  5 hours .  Whisky  Weekend .  Sweet tea  1/2 and 1/2   No lows dm  No other meds  Cost in past  Although januvia really helped in past  No cce no cp sob  Vision change or feet numbness   Tried pravastatin but had flush feeling and stopped uncertain if from the med or not but the sx resolved   BP is good and no se of med ROS: See pertinent positives and negatives per HPI.  Past Medical History  Diagnosis Date  . DIABETES MELLITUS, TYPE II 12/04/2006  . HYPERLIPIDEMIA 12/04/2006  . PANIC DISORDER 12/04/2006  . HYPERTENSION 12/04/2006  . Personal history of venous thrombosis and embolism 12/04/2006  . CONGESTIVE HEART FAILURE 12/04/2006    2005 Dec lvf echo 50-55% ef 2007  . History of DVT (deep vein thrombosis)     with PE 12 2005  . History of pulmonary embolism     12 05    with HF and hypertension    Family History  Problem Relation Age of Onset  . Kidney disease Mother   . Hypertension Mother   . Hypertension Father   . Heart attack Father     Social History   Social History  . Marital Status: Married    Spouse Name: N/A  . Number of Children: N/A  . Years of Education: N/A   Social History Main Topics  . Smoking status: Never Smoker   . Smokeless tobacco: None  . Alcohol Use: Yes  . Drug Use: No  . Sexual Activity: Not Asked   Other Topics Concern  . 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           Outpatient  Prescriptions Prior to Visit  Medication Sig Dispense Refill  . aspirin 81 MG tablet Take 81 mg by mouth daily.      Marland Kitchen lisinopril-hydrochlorothiazide (PRINZIDE,ZESTORETIC) 20-25 MG tablet TAKE ONE TABLET BY MOUTH EVERY DAY 30 tablet 1  . metFORMIN (GLUCOPHAGE) 500 MG tablet TAKE FOUR TABLETS BY MOUTH EVERY DAY WITH BREAKFAST 120 tablet 0  . pravastatin (PRAVACHOL) 20 MG tablet Take 1 tablet (20 mg total) by mouth daily. 90 tablet 1  . metFORMIN (GLUCOPHAGE) 500 MG tablet TAKE FOUR TABLETS BY MOUTH EVERY DAY WITH BREAKFAST 120 tablet 0  . metFORMIN (GLUCOPHAGE) 500 MG tablet TAKE FOUR TABLETS BY MOUTH EVERY DAY WITH BREAKFAST 120 tablet 0   No facility-administered medications prior to visit.     EXAM:  BP 100/72 mmHg  Temp(Src) 98.4 F (36.9 C) (Oral)  Wt 203 lb 1.6 oz (92.126 kg)  Body mass index is 27.36 kg/(m^2).  GENERAL: vitals reviewed and listed above, alert, oriented, appears well hydrated and in no acute distress HEENT: atraumatic, conjunctiva  clear, no obvious abnormalities on inspection of  external nose and ears OP : no lesion edema or exudate  NECK: no obvious masses on inspection palpation  No jvd  LUNGS: clear to auscultation bilaterally, no wheezes, rales or rhonchi, good air movement Abdomen:  Sof,t normal bowel sounds without hepatosplenomegaly, no guarding rebound or masses no CVA tenderness CV: HRRR, no clubbing cyanosis or  peripheral edema nl cap refill  MS: moves all extremities without noticeable focal  abnormality PSYCH: pleasant and cooperative, no obvious depression or anxiety Foot exam is normal   Lab Results  Component Value Date   WBC 7.8 05/26/2014   HGB 15.7 05/26/2014   HCT 47.1 05/26/2014   PLT 303.0 05/26/2014   GLUCOSE 230* 08/10/2015   CHOL 211* 08/10/2015   TRIG 144.0 08/10/2015   HDL 36.70* 08/10/2015   LDLDIRECT 142.0 05/26/2014   LDLCALC 145* 08/10/2015   ALT 22 05/26/2014   AST 17 05/26/2014   NA 133* 08/10/2015   K 4.0  08/10/2015   CL 96 08/10/2015   CREATININE 1.01 08/10/2015   BUN 17 08/10/2015   CO2 30 08/10/2015   TSH 1.37 05/26/2014   PSA 2.96 11/12/2012   HGBA1C 8.1* 08/10/2015   MICROALBUR 8.5* 11/12/2012   BP Readings from Last 3 Encounters:  08/23/15 100/72  02/16/15 120/80  10/16/14 128/80   Wt Readings from Last 3 Encounters:  08/23/15 203 lb 1.6 oz (92.126 kg)  02/16/15 209 lb 11.2 oz (95.119 kg)  10/16/14 212 lb (96.163 kg)    ASSESSMENT AND PLAN:  Discussed the following assessment and plan:  medication regimen limited  by patient due to financial insurance coverage   Essential hypertension  Diabetes mellitus without complication ([place]) - detrioration of a1c now over 8  he will intensify lsi cost of meds is lmitiatino ot control. look into jardiance med support etc   Hyperlipidemia - poss se of med can try lower dose  for now  Stopped th prvachol caiuse of  Poss se  [place].  -Patient advised to return or notify health care team  if symptoms worsen ,persist or new concerns arise.  Patient Instructions  Take metformin 4 500 mg per day  No sweet tea or  Sweet beverages.  Intensify lifestyle interventions. As you discussed .  Check feet  On a regular basis .   Other meds may help you also   Check to see if generic zetia .  Is covered by your  Plan .   Try  Pravastatin   10 mg per day or   20 mg 3 x per week.  For cholesterol.  Check to see if  jardiance may be covered    I can give you a coupon to start   Check into the medicine  Pharmacy  Coverage for your plan .   Standley Brooking. Tyrail Grandfield M.D.

## 2015-08-23 ENCOUNTER — Ambulatory Visit (INDEPENDENT_AMBULATORY_CARE_PROVIDER_SITE_OTHER): Payer: No Typology Code available for payment source | Admitting: Internal Medicine

## 2015-08-23 ENCOUNTER — Encounter: Payer: Self-pay | Admitting: Internal Medicine

## 2015-08-23 VITALS — BP 100/72 | Temp 98.4°F | Wt 203.1 lb

## 2015-08-23 DIAGNOSIS — E119 Type 2 diabetes mellitus without complications: Secondary | ICD-10-CM

## 2015-08-23 DIAGNOSIS — I10 Essential (primary) hypertension: Secondary | ICD-10-CM

## 2015-08-23 DIAGNOSIS — E785 Hyperlipidemia, unspecified: Secondary | ICD-10-CM

## 2015-08-23 DIAGNOSIS — Z91120 Patient's intentional underdosing of medication regimen due to financial hardship: Secondary | ICD-10-CM

## 2015-08-23 NOTE — Patient Instructions (Addendum)
Take metformin 4 500 mg per day  No sweet tea or  Sweet beverages.  Intensify lifestyle interventions. As you discussed .  Check feet  On a regular basis .   Other meds may help you also   Check to see if generic zetia .  Is covered by your  Plan .   Try  Pravastatin   10 mg per day or   20 mg 3 x per week.  For cholesterol.  Check to see if  jardiance may be covered    I can give you a coupon to start   Check into the medicine  Pharmacy  Coverage for your plan .

## 2015-09-20 ENCOUNTER — Other Ambulatory Visit: Payer: Self-pay | Admitting: Internal Medicine

## 2015-09-21 NOTE — Telephone Encounter (Signed)
Sent to the pharmacy by e-scribe. 

## 2015-11-22 ENCOUNTER — Ambulatory Visit: Payer: No Typology Code available for payment source | Admitting: Internal Medicine

## 2016-01-02 ENCOUNTER — Other Ambulatory Visit: Payer: Self-pay | Admitting: Internal Medicine

## 2016-01-04 ENCOUNTER — Telehealth: Payer: Self-pay | Admitting: Family Medicine

## 2016-01-04 NOTE — Telephone Encounter (Signed)
Sent to the pharmacy by e-scribe for 30 days.  Pt is now past due for follow up and A1C (at visit).  Message sent to scheduling.

## 2016-01-04 NOTE — Telephone Encounter (Signed)
Pt is now past due for follow up with A1C at the visit.  Please help the pt to make an appt.  Thanks!!!

## 2016-01-04 NOTE — Telephone Encounter (Signed)
lmom for pt to call back

## 2016-01-06 NOTE — Telephone Encounter (Signed)
lmom for pt to call back

## 2016-01-11 NOTE — Telephone Encounter (Signed)
lmom for pt to call back

## 2016-02-08 ENCOUNTER — Other Ambulatory Visit: Payer: Self-pay | Admitting: Internal Medicine

## 2016-02-16 NOTE — Telephone Encounter (Signed)
Sent to the pharmacy by e-scribe for 6 months per WP. 

## 2016-02-16 NOTE — Telephone Encounter (Signed)
Ok to refill x 6 months 

## 2016-03-17 ENCOUNTER — Other Ambulatory Visit: Payer: Self-pay | Admitting: Internal Medicine

## 2016-03-20 ENCOUNTER — Telehealth: Payer: Self-pay | Admitting: Family Medicine

## 2016-03-20 NOTE — Telephone Encounter (Signed)
Per [facility], sent to the pharmacy for 2 months.  Message sent to scheduling.  Pt due for follow up and A1C at office visit.

## 2016-03-20 NOTE — Telephone Encounter (Signed)
Pt is past due for follow up of diabetes.  Please help the pt to get scheduled.  Will do A1C at office visit.  No need to fast.

## 2016-03-22 NOTE — Telephone Encounter (Signed)
Pt has been sch for 04-05-16

## 2016-04-04 NOTE — Progress Notes (Signed)
Pre visit review using our clinic review tool, if applicable. No additional management support is needed unless otherwise documented below in the visit note.  Chief Complaint  Patient presents with  . Follow-up    HPI: Bobby Obrien 55 y.o.  Chronic disease management DM not checking  Readings feels fine  Could do more exercise  On max metformin  No  Vision change lows   Some tingling in feet at times  HT no problem with meds  No sx  Taking  No tob caretaking mom works musician night  Otherwise no  ets   ocass etoh.  ROS: See pertinent positives and negatives per HPI. No cp sob  Weight loss change in bowel habits   Past Medical History:  Diagnosis Date  . CONGESTIVE HEART FAILURE 12/04/2006   2005 Dec lvf echo 50-55% ef 2007  . DIABETES MELLITUS, TYPE II 12/04/2006  . History of DVT (deep vein thrombosis)    with PE 12 2005  . History of pulmonary embolism    75 05    with HF and hypertension  . HYPERLIPIDEMIA 12/04/2006  . HYPERTENSION 12/04/2006  . PANIC DISORDER 12/04/2006  . Personal history of venous thrombosis and embolism 12/04/2006    Family History  Problem Relation Age of Onset  . Kidney disease Mother   . Hypertension Mother   . Hypertension Father   . Heart attack Father     Social History   Social History  . Marital status: Married    Spouse name: N/A  . Number of children: N/A  . Years of education: N/A   Social History Main Topics  . Smoking status: Never Smoker  . Smokeless tobacco: None  . Alcohol use Yes  . Drug use: No  . Sexual activity: Not Asked   Other Topics Concern  . 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           Outpatient Medications Prior to Visit  Medication Sig Dispense Refill  . aspirin 81 MG tablet Take 81 mg by mouth daily.      Marland Kitchen lisinopril-hydrochlorothiazide (PRINZIDE,ZESTORETIC) 20-25 MG tablet TAKE ONE TABLET BY MOUTH EVERY DAY 30  tablet 5  . metFORMIN (GLUCOPHAGE) 500 MG tablet TAKE FOUR TABLETS BY MOUTH EVERY DAY WITH BREAKFAST 120 tablet 1  . pravastatin (PRAVACHOL) 20 MG tablet Take 1 tablet (20 mg total) by mouth daily. 90 tablet 1   No facility-administered medications prior to visit.      EXAM:  BP 114/76 (BP Location: Right Arm, Patient Position: Sitting, Cuff Size: Large)   Temp 97.7 F (36.5 C) (Oral)   Wt 209 lb 11.2 oz (95.1 kg)   BMI 28.24 kg/m   Body mass index is 28.24 kg/m.  GENERAL: vitals reviewed and listed above, alert, oriented, appears well hydrated and in no acute distress HEENT: atraumatic, conjunctiva  clear, no obvious abnormalities on inspection of external nose and ears OP : no lesion edema or exudate  NECK: no obvious masses on inspection palpation  LUNGS: clear to auscultation bilaterally, no wheezes, rales or rhonchi, good air movement CV: HRRR, no clubbing cyanosis or  peripheral edema nl cap refill  Abdomen:  Sof,t normal bowel sounds without hepatosplenomegaly, no guarding rebound or masses no CVA tenderness MS: moves all extremities without noticeable focal  abnormality PSYCH: pleasant and cooperative, no  obvious depression or anxiety Diabetic Foot Exam - Simple   Simple Foot Form Diabetic Foot exam was performed with the following findings:  Yes 04/05/2016 10:30 AM  Visual Inspection No deformities, no ulcerations, no other skin breakdown bilaterally:  Yes Sensation Testing Intact to touch and monofilament testing bilaterally:  Yes Pulse Check Posterior Tibialis and Dorsalis pulse intact bilaterally:  Yes Comments     Lab Results  Component Value Date   WBC 7.8 05/26/2014   HGB 15.7 05/26/2014   HCT 47.1 05/26/2014   PLT 303.0 05/26/2014   GLUCOSE 230 (H) 08/10/2015   CHOL 211 (H) 08/10/2015   TRIG 144.0 08/10/2015   HDL 36.70 (L) 08/10/2015   LDLDIRECT 142.0 05/26/2014   LDLCALC 145 (H) 08/10/2015   ALT 22 05/26/2014   AST 17 05/26/2014   NA 133 (L)  08/10/2015   K 4.0 08/10/2015   CL 96 08/10/2015   CREATININE 1.01 08/10/2015   BUN 17 08/10/2015   CO2 30 08/10/2015   TSH 1.37 05/26/2014   PSA 2.96 11/12/2012   HGBA1C 8.1 04/05/2016   MICROALBUR 8.5 (H) 11/12/2012   BP Readings from Last 3 Encounters:  04/05/16 114/76  08/23/15 100/72  02/16/15 120/80   Wt Readings from Last 3 Encounters:  04/05/16 209 lb 11.2 oz (95.1 kg)  08/23/15 203 lb 1.6 oz (92.1 kg)  02/16/15 209 lb 11.2 oz (95.1 kg)    ASSESSMENT AND PLAN:  Discussed the following assessment and plan:  Diabetes mellitus without complication ([place]) - Plan: POCT A1C  Essential hypertension  Statin intolerance  Hyperlipidemia, unspecified hyperlipidemia type  Medication management  medication regimen limited  by patient due to financial insurance coverage  Financial limitations to  meds  .  Coupon off onglyza once month   Trial  Had excellent reading on januvia but   Couldn't afford    Price in his program .   disc options   Get his eye checks   Exercise as tolerated   CV status appears stable  Uncertain   Se of statin meds  Consider others  Cost an issue    -Patient advised to return or notify health care team  if symptoms worsen ,persist or new concerns arise. Counseled. Patient Instructions  Continue lifestyle intervention healthy eating and exercise . Stay on metformin    Can try onglyza 30 days free   Coupon similar to  [place]     Check into patient assistance programs  For med such as [place]    On line   And may be able to get  Subsidy if  Low income .   Wellness check  Up and labs in  April 2018    Standley Brooking. Panosh M.D.

## 2016-04-05 ENCOUNTER — Ambulatory Visit (INDEPENDENT_AMBULATORY_CARE_PROVIDER_SITE_OTHER): Payer: No Typology Code available for payment source | Admitting: Internal Medicine

## 2016-04-05 ENCOUNTER — Encounter: Payer: Self-pay | Admitting: Internal Medicine

## 2016-04-05 VITALS — BP 114/76 | Temp 97.7°F | Wt 209.7 lb

## 2016-04-05 DIAGNOSIS — Z79899 Other long term (current) drug therapy: Secondary | ICD-10-CM

## 2016-04-05 DIAGNOSIS — Z91120 Patient's intentional underdosing of medication regimen due to financial hardship: Secondary | ICD-10-CM

## 2016-04-05 DIAGNOSIS — E785 Hyperlipidemia, unspecified: Secondary | ICD-10-CM

## 2016-04-05 DIAGNOSIS — E119 Type 2 diabetes mellitus without complications: Secondary | ICD-10-CM

## 2016-04-05 DIAGNOSIS — I10 Essential (primary) hypertension: Secondary | ICD-10-CM

## 2016-04-05 DIAGNOSIS — Z789 Other specified health status: Secondary | ICD-10-CM

## 2016-04-05 LAB — POCT GLYCOSYLATED HEMOGLOBIN (HGB A1C): HEMOGLOBIN A1C: 8.1

## 2016-04-05 MED ORDER — SAXAGLIPTIN HCL 5 MG PO TABS: 5.0000 mg | ORAL_TABLET | Freq: Every day | ORAL | 0 refills | Status: DC

## 2016-04-05 NOTE — Patient Instructions (Addendum)
Continue lifestyle intervention healthy eating and exercise . Stay on metformin    Can try onglyza 30 days free   Coupon similar to  [place]     Check into patient assistance programs  For med such as [place]    On line   And may be able to get  Subsidy if  Low income .   Wellness check  Up and labs in  April 2018

## 2016-06-24 ENCOUNTER — Other Ambulatory Visit: Payer: Self-pay | Admitting: Internal Medicine

## 2016-06-26 NOTE — Telephone Encounter (Signed)
Filled on 03/20/16 for six months.  Message sent to the pharmacy to check file.

## 2016-06-28 MED ORDER — METFORMIN HCL 500 MG PO TABS: ORAL_TABLET | ORAL | 3 refills | Status: DC

## 2016-06-28 NOTE — Telephone Encounter (Signed)
Pt coming in on 10/04/16 for cpx.

## 2016-06-28 NOTE — Addendum Note (Signed)
Addended by: Miles Costain T on: 06/28/2016 09:14 AM   Modules accepted: Orders

## 2016-09-27 ENCOUNTER — Other Ambulatory Visit: Payer: No Typology Code available for payment source

## 2016-10-04 ENCOUNTER — Encounter: Payer: No Typology Code available for payment source | Admitting: Internal Medicine

## 2016-10-21 ENCOUNTER — Other Ambulatory Visit: Payer: Self-pay | Admitting: Internal Medicine

## 2017-01-19 NOTE — Progress Notes (Deleted)
No chief complaint on file.   HPI: Patient  Bobby Obrien  56 y.o. comes in today for Preventive Health Care visit  And Chronic disease management DM BP LIPIDS  Health Maintenance  Topic Date Due  . Hepatitis C Screening  1960-12-20  . HIV Screening  07/15/1974  . OPHTHALMOLOGY EXAM  05/08/2008  . COLONOSCOPY  07/14/2009  . PNEUMOCOCCAL POLYSACCHARIDE VACCINE (2) 06/22/2015  . HEMOGLOBIN A1C  10/03/2016  . INFLUENZA VACCINE  12/06/2016  . FOOT EXAM  04/05/2017  . TETANUS/TDAP  06/21/2020   Health Maintenance Review LIFESTYLE:  Exercise:   Tobacco/ETS: Alcohol:  Sugar beverages: Sleep: Drug use: no HH of  Work:    ROS:  GEN/ HEENT: No fever, significant weight changes sweats headaches vision problems hearing changes, CV/ PULM; No chest pain shortness of breath cough, syncope,edema  change in exercise tolerance. GI /GU: No adominal pain, vomiting, change in bowel habits. No blood in the stool. No significant GU symptoms. SKIN/HEME: ,no acute skin rashes suspicious lesions or bleeding. No lymphadenopathy, nodules, masses.  NEURO/ PSYCH:  No neurologic signs such as weakness numbness. No depression anxiety. IMM/ Allergy: No unusual infections.  Allergy .   REST of 12 system review negative except as per HPI   Past Medical History:  Diagnosis Date  . CONGESTIVE HEART FAILURE 12/04/2006   2005 Dec lvf echo 50-55% ef 2007  . DIABETES MELLITUS, TYPE II 12/04/2006  . History of DVT (deep vein thrombosis)    with PE 12 2005  . History of pulmonary embolism    61 05    with HF and hypertension  . HYPERLIPIDEMIA 12/04/2006  . HYPERTENSION 12/04/2006  . PANIC DISORDER 12/04/2006  . Personal history of venous thrombosis and embolism 12/04/2006    No past surgical history on file.  Family History  Problem Relation Age of Onset  . Kidney disease Mother   . Hypertension Mother   . Hypertension Father   . Heart attack Father     Social History   Social History  .  Marital status: Married    Spouse name: N/A  . Number of children: N/A  . Years of education: N/A   Social History Main Topics  . Smoking status: Never Smoker  . Smokeless tobacco: Not on file  . Alcohol use Yes  . Drug use: No  . Sexual activity: Not on file   Other Topics Concern  . 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           Outpatient Medications Prior to Visit  Medication Sig Dispense Refill  . aspirin 81 MG tablet Take 81 mg by mouth daily.      Marland Kitchen lisinopril-hydrochlorothiazide (PRINZIDE,ZESTORETIC) 20-25 MG tablet TAKE ONE TABLET BY MOUTH DAILY 30 tablet 2  . metFORMIN (GLUCOPHAGE) 500 MG tablet TAKE FOUR TABLETS BY MOUTH EVERY DAY WITH BREAKFAST 120 tablet 3  . saxagliptin HCl (ONGLYZA) 5 MG TABS tablet Take 1 tablet (5 mg total) by mouth daily. 30 tablet 0   No facility-administered medications prior to visit.      EXAM:  There were no vitals taken for this visit.  There is no height or weight on file to calculate BMI. Wt Readings from Last 3 Encounters:  04/05/16 209 lb 11.2 oz (95.1 kg)  08/23/15 203 lb 1.6 oz (92.1  kg)  02/16/15 209 lb 11.2 oz (95.1 kg)    Physical Exam: Vital signs reviewed [id number] is a well-developed well-nourished alert cooperative    who appearsr stated age in no acute distress.  HEENT: normocephalic atraumatic , Eyes: PERRL EOM's full, conjunctiva clear, Nares: paten,t no deformity discharge or tenderness., Ears: no deformity EAC's clear TMs with normal landmarks. Mouth: clear OP, no lesions, edema.  Moist mucous membranes. Dentition in adequate repair. NECK: supple without masses, thyromegaly or bruits. CHEST/PULM:  Clear to auscultation and percussion breath sounds equal no wheeze , rales or rhonchi. No chest wall deformities or tenderness. Breast: normal by inspection . No dimpling, discharge, masses, tenderness or discharge . CV:  PMI is nondisplaced, S1 S2 no gallops, murmurs, rubs. Peripheral pulses are full without delay.No JVD .  ABDOMEN: Bowel sounds normal nontender  No guard or rebound, no hepato splenomegal no CVA tenderness.  No hernia. Extremtities:  No clubbing cyanosis or edema, no acute joint swelling or redness no focal atrophy NEURO:  Oriented x3, cranial nerves 3-12 appear to be intact, no obvious focal weakness,gait within normal limits no abnormal reflexes or asymmetrical SKIN: No acute rashes normal turgor, color, no bruising or petechiae. PSYCH: Oriented, good eye contact, no obvious depression anxiety, cognition and judgment appear normal. LN: no cervical axillary inguinal adenopathy  Lab Results  Component Value Date   WBC 7.8 05/26/2014   HGB 15.7 05/26/2014   HCT 47.1 05/26/2014   PLT 303.0 05/26/2014   GLUCOSE 230 (H) 08/10/2015   CHOL 211 (H) 08/10/2015   TRIG 144.0 08/10/2015   HDL 36.70 (L) 08/10/2015   LDLDIRECT 142.0 05/26/2014   LDLCALC 145 (H) 08/10/2015   ALT 22 05/26/2014   AST 17 05/26/2014   NA 133 (L) 08/10/2015   K 4.0 08/10/2015   CL 96 08/10/2015   CREATININE 1.01 08/10/2015   BUN 17 08/10/2015   CO2 30 08/10/2015   TSH 1.37 05/26/2014   PSA 2.96 11/12/2012   HGBA1C 8.1 04/05/2016   MICROALBUR 8.5 (H) 11/12/2012    BP Readings from Last 3 Encounters:  04/05/16 114/76  08/23/15 100/72  02/16/15 120/80   Wt Readings from Last 3 Encounters:  04/05/16 209 lb 11.2 oz (95.1 kg)  08/23/15 203 lb 1.6 oz (92.1 kg)  02/16/15 209 lb 11.2 oz (95.1 kg)     Lab results reviewed with patient   ASSESSMENT AND PLAN:  Discussed the following assessment and plan:  No diagnosis found.  Patient Care Team: Burnis Medin, MD as PCP - General There are no Patient Instructions on file for this visit.  Standley Brooking. Panosh M.D.

## 2017-01-22 ENCOUNTER — Encounter: Payer: No Typology Code available for payment source | Admitting: Internal Medicine

## 2017-01-28 ENCOUNTER — Telehealth: Payer: Self-pay | Admitting: Internal Medicine
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
## 2017-01-30 ENCOUNTER — Other Ambulatory Visit: Payer: Self-pay | Admitting: Emergency Medicine

## 2017-01-30 MED ORDER — METFORMIN HCL 500 MG PO TABS: ORAL_TABLET | ORAL | 3 refills | Status: DC

## 2017-01-30 NOTE — Telephone Encounter (Signed)
Medication has been sent to the pharmacy. 

## 2017-02-01 ENCOUNTER — Other Ambulatory Visit: Payer: Self-pay | Admitting: Internal Medicine

## 2017-02-01 NOTE — Telephone Encounter (Signed)
° ° ° °  Pt request refill of the following:  lisinopril-hydrochlorothiazide (PRINZIDE,ZESTORETIC) 20-25 MG tablet   metFORMIN (GLUCOPHAGE) 500 MG tablet   Phamacy:  [facility]

## 2017-02-02 NOTE — Telephone Encounter (Signed)
Medication filled to pharmacy as requested.   

## 2017-02-06 MED ORDER — METFORMIN HCL 500 MG PO TABS: ORAL_TABLET | ORAL | 3 refills | Status: DC

## 2017-02-06 NOTE — Telephone Encounter (Signed)
Re-sent rx looks like the rx for the Metformin on 9/25 states phone in and not normal. Will call and inform pt

## 2017-02-06 NOTE — Telephone Encounter (Signed)
Pt is calling to check the status of the Rx metformin state that he is out of the medication he received the lisinopril    Pharm:  [facility]  Pt was aware that Dr. Regis Bill was out of the office on last week and returned Monday 02/05/17

## 2017-02-07 ENCOUNTER — Telehealth: Payer: Self-pay | Admitting: Internal Medicine

## 2017-02-07 ENCOUNTER — Telehealth: Payer: Self-pay | Admitting: *Deleted

## 2017-02-07 MED ORDER — METFORMIN HCL 500 MG PO TABS: ORAL_TABLET | ORAL | 3 refills | Status: DC

## 2017-02-07 NOTE — Telephone Encounter (Signed)
Patient states he has ran out of his metformin and he needs this filled ASAP.    Please advise

## 2017-02-07 NOTE — Telephone Encounter (Signed)
Pt was notified and just to assure attempted to reach pharmacy but phone lines are busy. Will try to call back

## 2017-02-07 NOTE — Telephone Encounter (Signed)
There are several  messages in regards to this. Will close this message as this has already been taken care of and pt notified

## 2017-02-07 NOTE — Telephone Encounter (Addendum)
Patient has called again following up on his previous message, patient stated again he really needs his metformin refilled and he does not have any to take. Patient states the pharmacy told him they have not heard anything from our office regarding this medication. I called patient back and left him a message stating the prescription was sent yesterday to his pharmacy.

## 2017-02-07 NOTE — Telephone Encounter (Signed)
Pharmacy is calling to see if the Rx metformin can be resent to [facility] they have not received it as of now.

## 2017-02-07 NOTE — Telephone Encounter (Signed)
Refills sent

## 2017-03-13 ENCOUNTER — Ambulatory Visit (INDEPENDENT_AMBULATORY_CARE_PROVIDER_SITE_OTHER): Payer: No Typology Code available for payment source | Admitting: Internal Medicine

## 2017-03-13 ENCOUNTER — Encounter: Payer: Self-pay | Admitting: Internal Medicine

## 2017-03-13 VITALS — BP 112/72 | HR 87 | Temp 98.2°F | Ht 71.75 in | Wt 202.7 lb

## 2017-03-13 DIAGNOSIS — I10 Essential (primary) hypertension: Secondary | ICD-10-CM

## 2017-03-13 DIAGNOSIS — E785 Hyperlipidemia, unspecified: Secondary | ICD-10-CM

## 2017-03-13 DIAGNOSIS — Z1211 Encounter for screening for malignant neoplasm of colon: Secondary | ICD-10-CM

## 2017-03-13 DIAGNOSIS — Z79899 Other long term (current) drug therapy: Secondary | ICD-10-CM

## 2017-03-13 DIAGNOSIS — Z91120 Patient's intentional underdosing of medication regimen due to financial hardship: Secondary | ICD-10-CM

## 2017-03-13 DIAGNOSIS — Z0000 Encounter for general adult medical examination without abnormal findings: Secondary | ICD-10-CM

## 2017-03-13 DIAGNOSIS — E119 Type 2 diabetes mellitus without complications: Secondary | ICD-10-CM

## 2017-03-13 DIAGNOSIS — Z789 Other specified health status: Secondary | ICD-10-CM

## 2017-03-13 LAB — BASIC METABOLIC PANEL
BUN: 15 mg/dL (ref 6–23)
CO2: 30 mEq/L (ref 19–32)
Calcium: 10.1 mg/dL (ref 8.4–10.5)
Chloride: 97 mEq/L (ref 96–112)
Creatinine, Ser: 1.08 mg/dL (ref 0.40–1.50)
GFR: 90.42 mL/min (ref >60.00)
Glucose, Bld: 142 mg/dL — ABNORMAL HIGH (ref 70–99)
POTASSIUM: 4 meq/L (ref 3.5–5.1)
SODIUM: 134 meq/L — AB (ref 135–145)

## 2017-03-13 LAB — HEMOGLOBIN A1C: HEMOGLOBIN A1C: 8.3 % — AB (ref 4.6–6.5)

## 2017-03-13 LAB — LIPID PANEL
Cholesterol: 228 mg/dL — ABNORMAL HIGH (ref 0–200)
HDL: 35.7 mg/dL — AB (ref >39.00)
LDL Cholesterol: 161 mg/dL — ABNORMAL HIGH (ref 0–99)
NONHDL: 192.72
Total CHOL/HDL Ratio: 6
Triglycerides: 161 mg/dL — ABNORMAL HIGH (ref 0.0–149.0)
VLDL: 32.2 mg/dL (ref 0.0–40.0)

## 2017-03-13 MED ORDER — METFORMIN HCL 500 MG PO TABS: ORAL_TABLET | ORAL | 11 refills | Status: DC

## 2017-03-13 NOTE — Progress Notes (Signed)
Chief Complaint  Patient presents with  . Annual Exam    No new concerns    HPI: Patient  Bobby Obrien  56 y.o. comes in today for [place] visit a and Chronic disease management  DM not checking sugars taking metformin without side effects. HT taking medication without side effect needs a refill. Vision ok but no check recently  ocass tingling in feeet no numbness Still does not have regular insurance but just a discount card. No chest pain shortness of breath neurologic difficulties.  Is trying to eat healthy has occasional to rare  sugar beverage. No dental problems   Health Maintenance  Topic Date Due  . Hepatitis C Screening  1960-12-03  . HIV Screening  07/15/1974  . COLONOSCOPY  07/14/2009  . OPHTHALMOLOGY EXAM  03/14/2015  . PNEUMOCOCCAL POLYSACCHARIDE VACCINE (2) 06/22/2015  . HEMOGLOBIN A1C  10/03/2016  . INFLUENZA VACCINE  08/06/2017 (Originally 12/06/2016)  . FOOT EXAM  04/05/2017  . TETANUS/TDAP  06/21/2020   Health Maintenance Review LIFESTYLE:  Exercise:   Not much  But walking and   Active.  Mom caretaking.    Tobacco/ETS:  no Alcohol:  1-2   ocass  Sugar beverages: no  .    Sleep: 6- 7 hours   Drug use: no HH of 2  No pets  Work:  3 hours   Friday Saturday  .     ROS:  Vision hearing good  Same reading glasses.  No numbness someties tinglig at times feet.  GEN/ HEENT: No fever, significant weight changes sweats headaches vision problems hearing changes, CV/ PULM; No chest pain shortness of breath cough, syncope,edema  change in exercise tolerance. GI /GU: No adominal pain, vomiting, change in bowel habits. No blood in the stool. No significant GU symptoms. SKIN/HEME: ,no acute skin rashes suspicious lesions or bleeding. No lymphadenopathy, nodules, masses.  NEURO/ PSYCH:  No neurologic signs such as weakness numbness. No depression anxiety. IMM/ Allergy: No unusual infections.  Allergy .   REST of 12 system review negative except as  per HPI   Past Medical History:  Diagnosis Date  . CONGESTIVE HEART FAILURE 12/04/2006   2005 Dec lvf echo 50-55% ef 2007  . DIABETES MELLITUS, TYPE II 12/04/2006  . History of DVT (deep vein thrombosis)    with PE 12 2005  . History of pulmonary embolism    67 05    with HF and hypertension  . HYPERLIPIDEMIA 12/04/2006  . HYPERTENSION 12/04/2006  . PANIC DISORDER 12/04/2006  . Personal history of venous thrombosis and embolism 12/04/2006    No past surgical history on file.  Family History  Problem Relation Age of Onset  . Kidney disease Mother   . Hypertension Mother   . Hypertension Father   . Heart attack Father     Social History   Socioeconomic History  . Marital status: Married    Spouse name: None  . Number of children: None  . Years of education: None  . Highest education level: None  Social Needs  . Financial resource strain: None  . Food insecurity - worry: None  . Food insecurity - inability: None  . Transportation needs - medical: None  . Transportation needs - non-medical: None  Occupational History  . None  Tobacco Use  . Smoking status: Never Smoker  . Smokeless tobacco: Never Used  Substance and Sexual Activity  . Alcohol use: Yes  . Drug use: No  . Sexual activity:  None  Other Topics Concern  . None  Social History Narrative   HH of 2 married    Musician   Working evening arenas    [place]    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           Outpatient Medications Prior to Visit  Medication Sig Dispense Refill  . aspirin 81 MG tablet Take 81 mg by mouth daily.      Marland Kitchen lisinopril-hydrochlorothiazide (PRINZIDE,ZESTORETIC) 20-25 MG tablet TAKE ONE TABLET BY MOUTH DAILY 30 tablet 1  . metFORMIN (GLUCOPHAGE) 500 MG tablet TAKE FOUR TABLETS BY MOUTH EVERY DAY WITH BREAKFAST 120 tablet 3  . saxagliptin HCl (ONGLYZA) 5 MG TABS tablet Take 1 tablet (5 mg total) by mouth daily. (Patient not taking: Reported on 03/13/2017) 30 tablet 0   No facility-administered  medications prior to visit.      EXAM:  BP 112/72 (BP Location: Right Arm, Patient Position: Sitting, Cuff Size: Normal)   Pulse 87   Temp 98.2 F (36.8 C) (Oral)   Ht 5' 11.75" (1.822 m)   Wt 202 lb 11.2 oz (91.9 kg)   BMI 27.68 kg/m    Body mass index is 27.68 kg/m. Wt Readings from Last 3 Encounters:  03/13/17 202 lb 11.2 oz (91.9 kg)  04/05/16 209 lb 11.2 oz (95.1 kg)  08/23/15 203 lb 1.6 oz (92.1 kg)    Physical Exam: Vital signs reviewed [id number] is a well-developed well-nourished alert cooperative    who appearsr stated age in no acute distress.  HEENT: normocephalic atraumatic , Eyes: PERRL EOM's full, conjunctiva clear, Nares: paten,t no deformity discharge or tenderness., Ears: no deformity EAC's clear TMs with normal landmarks. Mouth: clear OP, no lesions, edema.  Moist mucous membranes. Dentition in adequate repair. NECK: supple without masses, thyromegaly or bruits. CHEST/PULM:  Clear to auscultation and percussion breath sounds equal no wheeze , rales or rhonchi. No chest wall deformities or tenderness. Breast: normal by inspection . No dimpling, discharge, masses, tenderness or discharge . CV: PMI is nondisplaced, S1 S2 no gallops, murmurs, rubs. Peripheral pulses are full without delay.No JVD .  ABDOMEN: Bowel sounds normal nontender  No guard or rebound, no hepato splenomegal no CVA tenderness.  No hernia. Extremtities:  No clubbing cyanosis or edema, no acute joint swelling or redness no focal atrophy NEURO:  Oriented x3, cranial nerves 3-12 appear to be intact, no obvious focal weakness,gait within normal limits no abnormal reflexes or asymmetrical SKIN: No acute rashes normal turgor, color, no bruising or petechiae. PSYCH: Oriented, good eye contact, no obvious depression anxiety, cognition and judgment appear normal. LN: no cervical axillary inguinal adenopathy Diabetic Foot Exam - Simple   Simple Foot Form Diabetic Foot exam was performed with the  following findings:  Yes 03/13/2017  1:15 PM  Visual Inspection No deformities, no ulcerations, no other skin breakdown bilaterally:  Yes Sensation Testing Intact to touch and monofilament testing bilaterally:  Yes Pulse Check Posterior Tibialis and Dorsalis pulse intact bilaterally:  Yes Comments     Lab Results  Component Value Date   WBC 7.8 05/26/2014   HGB 15.7 05/26/2014   HCT 47.1 05/26/2014   PLT 303.0 05/26/2014   GLUCOSE 142 (H) 03/13/2017   CHOL 228 (H) 03/13/2017   TRIG 161.0 (H) 03/13/2017   HDL 35.70 (L) 03/13/2017   LDLDIRECT 142.0 05/26/2014   LDLCALC 161 (H) 03/13/2017   ALT 22 05/26/2014   AST 17 05/26/2014   NA 134 (L) 03/13/2017   K  4.0 03/13/2017   CL 97 03/13/2017   CREATININE 1.08 03/13/2017   BUN 15 03/13/2017   CO2 30 03/13/2017   TSH 1.37 05/26/2014   PSA 2.96 11/12/2012   HGBA1C 8.3 (H) 03/13/2017   MICROALBUR 8.5 (H) 11/12/2012    BP Readings from Last 3 Encounters:  03/13/17 112/72  04/05/16 114/76  08/23/15 100/72   Wt Readings from Last 3 Encounters:  03/13/17 202 lb 11.2 oz (91.9 kg)  04/05/16 209 lb 11.2 oz (95.1 kg)  08/23/15 203 lb 1.6 oz (92.1 kg)    Blood work today is postprandial. ASSESSMENT AND PLAN:  Discussed the following assessment and plan:  Visit for preventive health examination - Plan: Basic metabolic panel, Hemoglobin A1c, Lipid panel  Medication management - Plan: Basic metabolic panel, Hemoglobin A1c, Lipid panel  Hyperlipidemia, unspecified hyperlipidemia type - Plan: Basic metabolic panel, Hemoglobin A1c, Lipid panel  Statin intolerance  medication regimen limited  by patient due to financial insurance coverage   Diabetes mellitus without complication ([place]) - Plan: Basic metabolic panel, Hemoglobin A1c, Lipid panel  Essential hypertension - Plan: Basic metabolic panel, Hemoglobin A1c, Lipid panel  Colon cancer screening - Plan: Fecal occult blood, imunochemical, Fecal occult blood,  imunochemical Shared Decision Making    For psa  Will do n future  pna vaccines  Other  Patient Care Team: Burnis Medin, MD as PCP - General Patient Instructions  Will notify you  of labs when available.  Stool card   Screening for colon cancer  Yearly .   If sugar is out of control we will need to  Add medication   Se getting eye exam when possible.     Diabetes Mellitus and Standards of Medical Care Managing diabetes (diabetes mellitus) can be complicated. Your diabetes treatment may be managed by a team of health care providers, including:  A diet and nutrition specialist (registered dietitian).  A nurse.  A certified diabetes educator (CDE).  A diabetes specialist (endocrinologist).  An eye doctor.  A primary care provider.  A dentist.  Your health care providers follow a schedule in order to help you get the best quality of care. The following schedule is a general guideline for your diabetes management plan. Your health care providers may also give you more specific instructions. HbA1c ( hemoglobin A1c) test This test provides information about blood sugar (glucose) control over the previous 2-3 months. It is used to check whether your diabetes management plan needs to be adjusted.  If you are meeting your treatment goals, this test is done at least 2 times a year.  If you are not meeting treatment goals or if your treatment goals have changed, this test is done 4 times a year.  Blood pressure test  This test is done at every routine medical visit. For most people, the goal is less than 130/80. Ask your health care provider what your goal blood pressure should be. Dental and eye exams  Visit your dentist two times a year.  If you have type 1 diabetes, get an eye exam 3-5 years after you are diagnosed, and then once a year after your first exam. ? If you were diagnosed with type 1 diabetes as a child, get an eye exam when you are age 33 or older and have had  diabetes for 3-5 years. After the first exam, you should get an eye exam once a year.  If you have type 2 diabetes, have an eye exam as soon as  you are diagnosed, and then once a year after your first exam. Foot care exam  Visual foot exams are done at every routine medical visit. The exams check for cuts, bruises, redness, blisters, sores, or other problems with the feet.  A complete foot exam is done by your health care provider once a year. This exam includes an inspection of the structure and skin of your feet, and a check of the pulses and sensation in your feet. ? Type 1 diabetes: Get your first exam 3-5 years after diagnosis. ? Type 2 diabetes: Get your first exam as soon as you are diagnosed.  Check your feet every day for cuts, bruises, redness, blisters, or sores. If you have any of these or other problems that are not healing, contact your health care provider. Kidney function test ( urine microalbumin)  This test is done once a year. ? Type 1 diabetes: Get your first test 5 years after diagnosis. ? Type 2 diabetes: Get your first test as soon as you are diagnosed.  If you have chronic kidney disease (CKD), get a serum creatinine and estimated glomerular filtration rate (eGFR) test once a year. Lipid profile (cholesterol, HDL, LDL, triglycerides)  This test should be done when you are diagnosed with diabetes, and every 5 years after the first test. If you are on medicines to lower your cholesterol, you may need to get this test done every year. ? The goal for LDL is less than 100 mg/dL (5.5 mmol/L). If you are at high risk, the goal is less than 70 mg/dL (3.9 mmol/L). ? The goal for HDL is 40 mg/dL (2.2 mmol/L) for men and 50 mg/dL(2.8 mmol/L) for women. An HDL cholesterol of 60 mg/dL (3.3 mmol/L) or higher gives some protection against heart disease. ? The goal for triglycerides is less than 150 mg/dL (8.3 mmol/L). Immunizations  The yearly flu (influenza) vaccine is  recommended for everyone 6 months or older who has diabetes.  The pneumonia (pneumococcal) vaccine is recommended for everyone 2 years or older who has diabetes. If you are 55 or older, you may get the pneumonia vaccine as a series of two separate shots.  The hepatitis B vaccine is recommended for adults shortly after they have been diagnosed with diabetes.  The Tdap (tetanus, diphtheria, and pertussis) vaccine should be given: ? According to normal childhood vaccination schedules, for children. ? Every 10 years, for adults who have diabetes.  The shingles vaccine is recommended for people who have had chicken pox and are 50 years or older. Mental and emotional health  Screening for symptoms of eating disorders, anxiety, and depression is recommended at the time of diagnosis and afterward as needed. If your screening shows that you have symptoms (you have a positive screening result), you may need further evaluation and be referred to a mental health care provider. Diabetes self-management education  Education about how to manage your diabetes is recommended at diagnosis and ongoing as needed. Treatment plan  Your treatment plan will be reviewed at every medical visit. Summary  Managing diabetes (diabetes mellitus) can be complicated. Your diabetes treatment may be managed by a team of health care providers.  Your health care providers follow a schedule in order to help you get the best quality of care.  Standards of care including having regular physical exams, blood tests, blood pressure monitoring, immunizations, screening tests, and education about how to manage your diabetes.  Your health care providers may also give you more specific instructions based  on your individual health. This information is not intended to replace advice given to you by your health care provider. Make sure you discuss any questions you have with your health care provider. Document Released: 02/19/2009  Document Revised: 01/21/2016 Document Reviewed: 01/21/2016 Elsevier Interactive Patient Education  2018 [place] Years, Male Preventive care refers to lifestyle choices and visits with your health care provider that can promote health and wellness. What does preventive care include?  A yearly physical exam. This is also called an annual well check.  Dental exams once or twice a year.  Routine eye exams. Ask your health care provider how often you should have your eyes checked.  Personal lifestyle choices, including: ? Daily care of your teeth and gums. ? Regular physical activity. ? Eating a healthy diet. ? Avoiding tobacco and drug use. ? Limiting alcohol use. ? Practicing safe sex. ? Taking low-dose aspirin every day starting at age 13. What happens during an annual well check? The services and screenings done by your health care provider during your annual well check will depend on your age, overall health, lifestyle risk factors, and family history of disease. Counseling Your health care provider may ask you questions about your:  Alcohol use.  Tobacco use.  Drug use.  Emotional well-being.  Home and relationship well-being.  Sexual activity.  Eating habits.  Work and work Statistician.  Screening You may have the following tests or measurements:  Height, weight, and BMI.  Blood pressure.  Lipid and cholesterol levels. These may be checked every 5 years, or more frequently if you are over 29 years old.  Skin check.  Lung cancer screening. You may have this screening every year starting at age 55 if you have a 30-pack-year history of smoking and currently smoke or have quit within the past 15 years.  Fecal occult blood test (FOBT) of the stool. You may have this test every year starting at age 79.  Flexible sigmoidoscopy or colonoscopy. You may have a sigmoidoscopy every 5 years or a colonoscopy every 10 years starting at age  39.  Prostate cancer screening. Recommendations will vary depending on your family history and other risks.  Hepatitis C blood test.  Hepatitis B blood test.  Sexually transmitted disease (STD) testing.  Diabetes screening. This is done by checking your blood sugar (glucose) after you have not eaten for a while (fasting). You may have this done every 1-3 years.  Discuss your test results, treatment options, and if necessary, the need for more tests with your health care provider. Vaccines Your health care provider may recommend certain vaccines, such as:  Influenza vaccine. This is recommended every year.  Tetanus, diphtheria, and acellular pertussis (Tdap, Td) vaccine. You may need a Td booster every 10 years.  Varicella vaccine. You may need this if you have not been vaccinated.  Zoster vaccine. You may need this after age 79.  Measles, mumps, and rubella (MMR) vaccine. You may need at least one dose of MMR if you were born in 1957 or later. You may also need a second dose.  Pneumococcal 13-valent conjugate (PCV13) vaccine. You may need this if you have certain conditions and have not been vaccinated.  Pneumococcal polysaccharide (PPSV23) vaccine. You may need one or two doses if you smoke cigarettes or if you have certain conditions.  Meningococcal vaccine. You may need this if you have certain conditions.  Hepatitis A vaccine. You may need this if you have  certain conditions or if you travel or work in places where you may be exposed to hepatitis A.  Hepatitis B vaccine. You may need this if you have certain conditions or if you travel or work in places where you may be exposed to hepatitis B.  Haemophilus influenzae type b (Hib) vaccine. You may need this if you have certain risk factors.  Talk to your health care provider about which screenings and vaccines you need and how often you need them. This information is not intended to replace advice given to you by your health  care provider. Make sure you discuss any questions you have with your health care provider. Document Released: 05/21/2015 Document Revised: 01/12/2016 Document Reviewed: 02/23/2015 Elsevier Interactive Patient Education  2017 [place]. Renita Brocks M.D.

## 2017-03-13 NOTE — Patient Instructions (Addendum)
Will notify you  of labs when available.  Stool card   Screening for colon cancer  Yearly .   If sugar is out of control we will need to  Add medication   Se getting eye exam when possible.     Diabetes Mellitus and Standards of Medical Care Managing diabetes (diabetes mellitus) can be complicated. Your diabetes treatment may be managed by a team of health care providers, including:  A diet and nutrition specialist (registered dietitian).  A nurse.  A certified diabetes educator (CDE).  A diabetes specialist (endocrinologist).  An eye doctor.  A primary care provider.  A dentist.  Your health care providers follow a schedule in order to help you get the best quality of care. The following schedule is a general guideline for your diabetes management plan. Your health care providers may also give you more specific instructions. HbA1c ( hemoglobin A1c) test This test provides information about blood sugar (glucose) control over the previous 2-3 months. It is used to check whether your diabetes management plan needs to be adjusted.  If you are meeting your treatment goals, this test is done at least 2 times a year.  If you are not meeting treatment goals or if your treatment goals have changed, this test is done 4 times a year.  Blood pressure test  This test is done at every routine medical visit. For most people, the goal is less than 130/80. Ask your health care provider what your goal blood pressure should be. Dental and eye exams  Visit your dentist two times a year.  If you have type 1 diabetes, get an eye exam 3-5 years after you are diagnosed, and then once a year after your first exam. ? If you were diagnosed with type 1 diabetes as a child, get an eye exam when you are age 2 or older and have had diabetes for 3-5 years. After the first exam, you should get an eye exam once a year.  If you have type 2 diabetes, have an eye exam as soon as you are diagnosed, and then  once a year after your first exam. Foot care exam  Visual foot exams are done at every routine medical visit. The exams check for cuts, bruises, redness, blisters, sores, or other problems with the feet.  A complete foot exam is done by your health care provider once a year. This exam includes an inspection of the structure and skin of your feet, and a check of the pulses and sensation in your feet. ? Type 1 diabetes: Get your first exam 3-5 years after diagnosis. ? Type 2 diabetes: Get your first exam as soon as you are diagnosed.  Check your feet every day for cuts, bruises, redness, blisters, or sores. If you have any of these or other problems that are not healing, contact your health care provider. Kidney function test ( urine microalbumin)  This test is done once a year. ? Type 1 diabetes: Get your first test 5 years after diagnosis. ? Type 2 diabetes: Get your first test as soon as you are diagnosed.  If you have chronic kidney disease (CKD), get a serum creatinine and estimated glomerular filtration rate (eGFR) test once a year. Lipid profile (cholesterol, HDL, LDL, triglycerides)  This test should be done when you are diagnosed with diabetes, and every 5 years after the first test. If you are on medicines to lower your cholesterol, you may need to get this test done every year. ?  The goal for LDL is less than 100 mg/dL (5.5 mmol/L). If you are at high risk, the goal is less than 70 mg/dL (3.9 mmol/L). ? The goal for HDL is 40 mg/dL (2.2 mmol/L) for men and 50 mg/dL(2.8 mmol/L) for women. An HDL cholesterol of 60 mg/dL (3.3 mmol/L) or higher gives some protection against heart disease. ? The goal for triglycerides is less than 150 mg/dL (8.3 mmol/L). Immunizations  The yearly flu (influenza) vaccine is recommended for everyone 6 months or older who has diabetes.  The pneumonia (pneumococcal) vaccine is recommended for everyone 2 years or older who has diabetes. If you are 59 or  older, you may get the pneumonia vaccine as a series of two separate shots.  The hepatitis B vaccine is recommended for adults shortly after they have been diagnosed with diabetes.  The Tdap (tetanus, diphtheria, and pertussis) vaccine should be given: ? According to normal childhood vaccination schedules, for children. ? Every 10 years, for adults who have diabetes.  The shingles vaccine is recommended for people who have had chicken pox and are 50 years or older. Mental and emotional health  Screening for symptoms of eating disorders, anxiety, and depression is recommended at the time of diagnosis and afterward as needed. If your screening shows that you have symptoms (you have a positive screening result), you may need further evaluation and be referred to a mental health care provider. Diabetes self-management education  Education about how to manage your diabetes is recommended at diagnosis and ongoing as needed. Treatment plan  Your treatment plan will be reviewed at every medical visit. Summary  Managing diabetes (diabetes mellitus) can be complicated. Your diabetes treatment may be managed by a team of health care providers.  Your health care providers follow a schedule in order to help you get the best quality of care.  Standards of care including having regular physical exams, blood tests, blood pressure monitoring, immunizations, screening tests, and education about how to manage your diabetes.  Your health care providers may also give you more specific instructions based on your individual health. This information is not intended to replace advice given to you by your health care provider. Make sure you discuss any questions you have with your health care provider. Document Released: 02/19/2009 Document Revised: 01/21/2016 Document Reviewed: 01/21/2016 Elsevier Interactive Patient Education  2018 [place] Years, Male Preventive care refers  to lifestyle choices and visits with your health care provider that can promote health and wellness. What does preventive care include?  A yearly physical exam. This is also called an annual well check.  Dental exams once or twice a year.  Routine eye exams. Ask your health care provider how often you should have your eyes checked.  Personal lifestyle choices, including: ? Daily care of your teeth and gums. ? Regular physical activity. ? Eating a healthy diet. ? Avoiding tobacco and drug use. ? Limiting alcohol use. ? Practicing safe sex. ? Taking low-dose aspirin every day starting at age 90. What happens during an annual well check? The services and screenings done by your health care provider during your annual well check will depend on your age, overall health, lifestyle risk factors, and family history of disease. Counseling Your health care provider may ask you questions about your:  Alcohol use.  Tobacco use.  Drug use.  Emotional well-being.  Home and relationship well-being.  Sexual activity.  Eating habits.  Work and work Statistician.  Screening  You may have the following tests or measurements:  Height, weight, and BMI.  Blood pressure.  Lipid and cholesterol levels. These may be checked every 5 years, or more frequently if you are over 15 years old.  Skin check.  Lung cancer screening. You may have this screening every year starting at age 29 if you have a 30-pack-year history of smoking and currently smoke or have quit within the past 15 years.  Fecal occult blood test (FOBT) of the stool. You may have this test every year starting at age 78.  Flexible sigmoidoscopy or colonoscopy. You may have a sigmoidoscopy every 5 years or a colonoscopy every 10 years starting at age 64.  Prostate cancer screening. Recommendations will vary depending on your family history and other risks.  Hepatitis C blood test.  Hepatitis B blood test.  Sexually  transmitted disease (STD) testing.  Diabetes screening. This is done by checking your blood sugar (glucose) after you have not eaten for a while (fasting). You may have this done every 1-3 years.  Discuss your test results, treatment options, and if necessary, the need for more tests with your health care provider. Vaccines Your health care provider may recommend certain vaccines, such as:  Influenza vaccine. This is recommended every year.  Tetanus, diphtheria, and acellular pertussis (Tdap, Td) vaccine. You may need a Td booster every 10 years.  Varicella vaccine. You may need this if you have not been vaccinated.  Zoster vaccine. You may need this after age 76.  Measles, mumps, and rubella (MMR) vaccine. You may need at least one dose of MMR if you were born in 1957 or later. You may also need a second dose.  Pneumococcal 13-valent conjugate (PCV13) vaccine. You may need this if you have certain conditions and have not been vaccinated.  Pneumococcal polysaccharide (PPSV23) vaccine. You may need one or two doses if you smoke cigarettes or if you have certain conditions.  Meningococcal vaccine. You may need this if you have certain conditions.  Hepatitis A vaccine. You may need this if you have certain conditions or if you travel or work in places where you may be exposed to hepatitis A.  Hepatitis B vaccine. You may need this if you have certain conditions or if you travel or work in places where you may be exposed to hepatitis B.  Haemophilus influenzae type b (Hib) vaccine. You may need this if you have certain risk factors.  Talk to your health care provider about which screenings and vaccines you need and how often you need them. This information is not intended to replace advice given to you by your health care provider. Make sure you discuss any questions you have with your health care provider. Document Released: 05/21/2015 Document Revised: 01/12/2016 Document Reviewed:  02/23/2015 Elsevier Interactive Patient Education  2017 [employer].

## 2017-04-17 ENCOUNTER — Other Ambulatory Visit: Payer: Self-pay | Admitting: Internal Medicine

## 2017-09-10 NOTE — Progress Notes (Signed)
Chief Complaint  Patient presents with  . Follow-up    A1c today    HPI: Bobby Obrien 57 y.o. come in for Chronic disease management  No insurance but discount card doing ok   Dm no checking sugars and  Often not taking max metformin per day   Had been ok   BP doing fine witht his   A fe times    Has had   woke up    Peanut butter and jelly  And  Bread  Felt like nervous and nervous and shaky  . No swelling ocass tingling  At times . Not as active   Exercise wise in past 6 months  ROS: See pertinent positives and negatives per HPI.  Past Medical History:  Diagnosis Date  . CONGESTIVE HEART FAILURE 12/04/2006   2005 Dec lvf echo 50-55% ef 2007  . DIABETES MELLITUS, TYPE II 12/04/2006  . History of DVT (deep vein thrombosis)    with PE 12 2005  . History of pulmonary embolism    37 05    with HF and hypertension  . HYPERLIPIDEMIA 12/04/2006  . HYPERTENSION 12/04/2006  . PANIC DISORDER 12/04/2006  . Personal history of venous thrombosis and embolism 12/04/2006    Family History  Problem Relation Age of Onset  . Kidney disease Mother   . Hypertension Mother   . Hypertension Father   . Heart attack Father     Social History   Socioeconomic History  . Marital status: Married    Spouse name: Not on file  . Number of children: Not on file  . Years of education: Not on file  . Highest education level: Not on file  Occupational History  . Not on file  Social Needs  . Financial resource strain: Not on file  . Food insecurity:    Worry: Not on file    Inability: Not on file  . Transportation needs:    Medical: Not on file    Non-medical: Not on file  Tobacco Use  . Smoking status: Never Smoker  . Smokeless tobacco: Never Used  Substance and Sexual Activity  . Alcohol use: Yes  . Drug use: No  . Sexual activity: Not on file  Lifestyle  . Physical activity:    Days per week: Not on file    Minutes per session: Not on file  . Stress: Not on file  Relationships    . Social connections:    Talks on phone: Not on file    Gets together: Not on file    Attends religious service: Not on file    Active member of club or organization: Not on file    Attends meetings of clubs or organizations: Not on file    Relationship status: Not on file  Other Topics Concern  . Not on file  Social History Narrative   HH of 2 married    Musician   Working evening arenas    [place]    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           Outpatient Medications Prior to Visit  Medication Sig Dispense Refill  . aspirin 81 MG tablet Take 81 mg by mouth daily.      Marland Kitchen lisinopril-hydrochlorothiazide (PRINZIDE,ZESTORETIC) 20-25 MG tablet TAKE ONE TABLET BY MOUTH DAILY 30 tablet 5  . metFORMIN (GLUCOPHAGE) 500 MG tablet TAKE FOUR TABLETS BY MOUTH EVERY DAY WITH BREAKFAST 120 tablet 11   No facility-administered medications prior to visit.  EXAM:  BP 118/68 (BP Location: Right Arm, Patient Position: Sitting, Cuff Size: Normal)   Pulse 82   Temp 98.5 F (36.9 C) (Oral)   Wt 206 lb 12.8 oz (93.8 kg)   BMI 28.24 kg/m   Body mass index is 28.24 kg/m.  GENERAL: vitals reviewed and listed above, alert, oriented, appears well hydrated and in no acute distress HEENT: atraumatic, conjunctiva  clear, no obvious abnormalities on inspection of external nose and ears NECK: no obvious masses on inspection palpation  LUNGS: clear to auscultation bilaterally, no wheezes, rales or rhonchi, good air movement CV: HRRR, no clubbing cyanosis or  peripheral edema nl cap refill  MS: moves all extremities without noticeable focal  abnormality PSYCH: pleasant and cooperative, no obvious depression or anxiety Lab Results  Component Value Date   WBC 7.8 05/26/2014   HGB 15.7 05/26/2014   HCT 47.1 05/26/2014   PLT 303.0 05/26/2014   GLUCOSE 142 (H) 03/13/2017   CHOL 228 (H) 03/13/2017   TRIG 161.0 (H) 03/13/2017   HDL 35.70 (L) 03/13/2017   LDLDIRECT 142.0 05/26/2014   LDLCALC 161 (H)  03/13/2017   ALT 22 05/26/2014   AST 17 05/26/2014   NA 134 (L) 03/13/2017   K 4.0 03/13/2017   CL 97 03/13/2017   CREATININE 1.08 03/13/2017   BUN 15 03/13/2017   CO2 30 03/13/2017   TSH 1.37 05/26/2014   PSA 2.96 11/12/2012   HGBA1C 8.2 09/11/2017   MICROALBUR 8.5 (H) 11/12/2012   BP Readings from Last 3 Encounters:  09/11/17 118/68  03/13/17 112/72  04/05/16 114/76   Wt Readings from Last 3 Encounters:  09/11/17 206 lb 12.8 oz (93.8 kg)  03/13/17 202 lb 11.2 oz (91.9 kg)  04/05/16 209 lb 11.2 oz (95.1 kg)     ASSESSMENT AND PLAN:  Discussed the following assessment and plan:  Diabetes mellitus without complication (HCC) - not at goal  cost an issue take max dose of metofremin and see plan can add  small dose of su and follow rov 6 mos or earlier if not getting to goal - Plan: POC HgB A1c  Statin intolerance - can try lovaststin   trial   seems to be statin intolerant and  not sure can afford zetia etc   Medication management - Plan: POC HgB A1c  Essential hypertension - controlled  Hyperlipidemia, unspecified hyperlipidemia type  medication regimen limited  by patient due to financial insurance coverage   Does not have health insurance  cpx November  Full set of labs   Total visit 32mins > 50% spent counseling and coordinating care as indicated in above note and in instructions to patient .    -Patient advised to return or notify health care team  if  new concerns arise.  Patient Instructions  Take   All 4  Metformin per day .   Then check blood sugar  Twice a day for 2 weeks .   Goal is   Fasting  Am 90 - 120   And after eating .    Below 180 -160 .Marland Kitchen  If not at goal add the     amaryl 1 mg per day in  morning  And continue to   Check sugars for another  2 weeks .    Try lovastatin    For cholesterol   If tolerated we can  Check at your yearly in 6 months.       Standley Brooking. Contrina Orona M.D.

## 2017-09-11 ENCOUNTER — Ambulatory Visit (INDEPENDENT_AMBULATORY_CARE_PROVIDER_SITE_OTHER): Payer: No Typology Code available for payment source | Admitting: Internal Medicine

## 2017-09-11 ENCOUNTER — Encounter: Payer: Self-pay | Admitting: Internal Medicine

## 2017-09-11 VITALS — BP 118/68 | HR 82 | Temp 98.5°F | Wt 206.8 lb

## 2017-09-11 DIAGNOSIS — Z91120 Patient's intentional underdosing of medication regimen due to financial hardship: Secondary | ICD-10-CM

## 2017-09-11 DIAGNOSIS — E785 Hyperlipidemia, unspecified: Secondary | ICD-10-CM

## 2017-09-11 DIAGNOSIS — Z598 Other problems related to housing and economic circumstances: Secondary | ICD-10-CM

## 2017-09-11 DIAGNOSIS — I10 Essential (primary) hypertension: Secondary | ICD-10-CM

## 2017-09-11 DIAGNOSIS — E119 Type 2 diabetes mellitus without complications: Secondary | ICD-10-CM

## 2017-09-11 DIAGNOSIS — Z79899 Other long term (current) drug therapy: Secondary | ICD-10-CM

## 2017-09-11 DIAGNOSIS — Z5989 Other problems related to housing and economic circumstances: Secondary | ICD-10-CM

## 2017-09-11 DIAGNOSIS — Z789 Other specified health status: Secondary | ICD-10-CM

## 2017-09-11 LAB — POCT GLYCOSYLATED HEMOGLOBIN (HGB A1C): HEMOGLOBIN A1C: 8.2

## 2017-09-11 MED ORDER — GLIMEPIRIDE 1 MG PO TABS: 1.0000 mg | ORAL_TABLET | Freq: Every day | ORAL | 3 refills | Status: DC

## 2017-09-11 MED ORDER — LOVASTATIN 20 MG PO TABS: 20.0000 mg | ORAL_TABLET | Freq: Every day | ORAL | 3 refills | Status: DC

## 2017-09-11 NOTE — Patient Instructions (Addendum)
Take   All 4  Metformin per day .   Then check blood sugar  Twice a day for 2 weeks .   Goal is   Fasting  Am 90 - 120   And after eating .    Below 180 -160 .Marland Kitchen  If not at goal add the     amaryl 1 mg per day in  morning  And continue to   Check sugars for another  2 weeks .    Try lovastatin    For cholesterol   If tolerated we can  Check at your yearly in 6 months.

## 2017-11-20 ENCOUNTER — Other Ambulatory Visit: Payer: Self-pay | Admitting: Internal Medicine

## 2017-11-20 ENCOUNTER — Telehealth: Payer: Self-pay | Admitting: Internal Medicine

## 2017-11-20 NOTE — Telephone Encounter (Signed)
Medication filled on 11/20/17

## 2017-11-20 NOTE — Telephone Encounter (Signed)
Copied from [place] [id number]. Topic: Quick Communication - Rx Refill/Question >> Nov 20, 2017 11:38 AM Margot Ables wrote: Medication: lisinopril - pt out - he states he contacted pharmacy and they sent request Has the patient contacted their pharmacy? yes Preferred Pharmacy (with phone number or street name): [facility]  Advised to call 5-7 days ahead and allow 3 days for processing

## 2018-03-12 ENCOUNTER — Ambulatory Visit: Payer: No Typology Code available for payment source | Admitting: Internal Medicine

## 2018-03-18 NOTE — Progress Notes (Signed)
Chief Complaint  Patient presents with  . Annual Exam    No new concerns    HPI: Patient  Bobby Obrien  57 y.o. comes in today for Preventive Health Care visit   Still no insurnce  The orang card   BP feels fine with med Thinks bg is ok but  Not checking   on metformin  stress care taking mom  lewy body dementia and min law and wife has medical problems  No feet problem   ocass cold sense  Had episide of eye change  After pancakes and syrup  When traveling short lvoer no lov or neuro sx  Thinks it was from  hih sugar  Meal. Never took lovastatin cause concern could cause se such as the crestor and lipitor  No amaryl  Health Maintenance  Topic Date Due  . Hepatitis C Screening  08-18-1960  . PNEUMOCOCCAL POLYSACCHARIDE VACCINE AGE 35-64 HIGH RISK  07/14/1961  . HIV Screening  07/15/1974  . COLONOSCOPY  07/14/2009  . OPHTHALMOLOGY EXAM  03/14/2015  . FOOT EXAM  03/13/2018  . HEMOGLOBIN A1C  03/14/2018  . INFLUENZA VACCINE  08/07/2018 (Originally 12/06/2017)  . TETANUS/TDAP  06/21/2020   Health Maintenance Review LIFESTYLE:  Exercise:   Not enougnh but active  Tobacco/ETS:n Alcohol: makers mark. Sugar beverages: no to rare.  Sleep:    Dec sick mom   And  Wife  About 5  Hours   Drug use: no HH of   2  Work:  muscic  9 hours     ROS:  See above  GEN/ HEENT: No fever, significant weight changes sweats headaches vision problems hearing changes, CV/ PULM; No chest pain shortness of breath cough, syncope,edema  change in exercise tolerance. GI /GU: No adominal pain, vomiting, change in bowel habits. No blood in the stool. No significant GU symptoms. SKIN/HEME: ,no acute skin rashes suspicious lesions or bleeding. No lymphadenopathy, nodules, masses.  NEURO/ PSYCH:  No neurologic signs such as weakness numbness. No depression anxiety. IMM/ Allergy: No unusual infections.  Allergy .   REST of 12 system review negative except as per HPI   Past Medical History:  Diagnosis Date   . CONGESTIVE HEART FAILURE 12/04/2006   2005 Dec lvf echo 50-55% ef 2007  . DIABETES MELLITUS, TYPE II 12/04/2006  . History of DVT (deep vein thrombosis)    with PE 12 2005  . History of pulmonary embolism    77 05    with HF and hypertension  . HYPERLIPIDEMIA 12/04/2006  . HYPERTENSION 12/04/2006  . PANIC DISORDER 12/04/2006  . Personal history of venous thrombosis and embolism 12/04/2006    No past surgical history on file.  Family History  Problem Relation Age of Onset  . Kidney disease Mother   . Hypertension Mother   . Hypertension Father   . Heart attack Father     Social History   Socioeconomic History  . Marital status: Married    Spouse name: Not on file  . Number of children: Not on file  . Years of education: Not on file  . Highest education level: Not on file  Occupational History  . Not on file  Social Needs  . Financial resource strain: Not on file  . Food insecurity:    Worry: Not on file    Inability: Not on file  . Transportation needs:    Medical: Not on file    Non-medical: Not on file  Tobacco Use  .  Smoking status: Never Smoker  . Smokeless tobacco: Never Used  Substance and Sexual Activity  . Alcohol use: Yes  . Drug use: No  . Sexual activity: Not on file  Lifestyle  . Physical activity:    Days per week: Not on file    Minutes per session: Not on file  . Stress: Not on file  Relationships  . Social connections:    Talks on phone: Not on file    Gets together: Not on file    Attends religious service: Not on file    Active member of club or organization: Not on file    Attends meetings of clubs or organizations: Not on file    Relationship status: Not on file  Other Topics Concern  . 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           Outpatient Medications Prior to Visit  Medication Sig Dispense Refill  . aspirin 81 MG tablet Take 81 mg  by mouth daily.      Marland Kitchen lisinopril-hydrochlorothiazide (PRINZIDE,ZESTORETIC) 20-25 MG tablet TAKE ONE TABLET BY MOUTH DAILY 90 tablet 0  . metFORMIN (GLUCOPHAGE) 500 MG tablet TAKE FOUR TABLETS BY MOUTH EVERY DAY WITH BREAKFAST 120 tablet 11  . glimepiride (AMARYL) 1 MG tablet Take 1 tablet (1 mg total) by mouth daily with breakfast. As directed (Patient not taking: Reported on 03/19/2018) 30 tablet 3  . lovastatin (MEVACOR) 20 MG tablet Take 1 tablet (20 mg total) by mouth at bedtime. (Patient not taking: Reported on 03/19/2018) 30 tablet 3   No facility-administered medications prior to visit.      EXAM:  BP 122/70 (BP Location: Right Arm, Patient Position: Sitting, Cuff Size: Normal)   Pulse 90   Temp 98.1 F (36.7 C) (Oral)   Ht 5' 11.85" (1.825 m)   Wt 202 lb 1.6 oz (91.7 kg)   BMI 27.52 kg/m   Body mass index is 27.52 kg/m. Wt Readings from Last 3 Encounters:  03/19/18 202 lb 1.6 oz (91.7 kg)  09/11/17 206 lb 12.8 oz (93.8 kg)  03/13/17 202 lb 11.2 oz (91.9 kg)    Physical Exam: Vital signs reviewed [id number]:[id number] is a well-developed well-nourished alert cooperative    who appearsr stated age in no acute distress.  HEENT: normocephalic atraumatic , Eyes: PERRL EOM's full, conjunctiva clear, Nares: paten,t no deformity discharge or tenderness., Ears: no deformity EAC's clear TMs with normal landmarks. Mouth: clear OP, no lesions, edema.  Moist mucous membranes. Dentition in adequate repair. NECK: supple without masses, thyromegaly or bruits. CHEST/PULM:  Clear to auscultation and percussion breath sounds equal no wheeze , rales or rhonchi. CV: PMI is nondisplaced, S1 S2 no gallops, murmurs, rubs. Peripheral pulses are full without delay.No JVD .  ABDOMEN: Bowel sounds normal nontender  No guard or rebound, no hepato splenomegal no CVA tenderness.  No hernia. Extremtities:  No clubbing cyanosis or edema, no acute joint swelling or redness no focal atrophy NEURO:  Oriented x3,  cranial nerves 3-12 appear to be intact, no obvious focal weakness,gait within normal limits no abnormal reflexes or asymmetrical SKIN: No acute rashes normal turgor, color, no bruising or petechiae. PSYCH: Oriented, good eye contact, no obvious depression anxiety, cognition and judgment appear normal. LN: no cervical axillary inguinal adenopathy Diabetic Foot Exam - Simple   Simple Foot Form Diabetic  Foot exam was performed with the following findings:  Yes 03/19/2018 11:08 AM  Visual Inspection No deformities, no ulcerations, no other skin breakdown bilaterally:  Yes Sensation Testing Intact to touch and monofilament testing bilaterally:  Yes Pulse Check Posterior Tibialis and Dorsalis pulse intact bilaterally:  Yes Comments     Lab Results  Component Value Date   WBC 7.8 05/26/2014   HGB 15.7 05/26/2014   HCT 47.1 05/26/2014   PLT 303.0 05/26/2014   GLUCOSE 142 (H) 03/13/2017   CHOL 228 (H) 03/13/2017   TRIG 161.0 (H) 03/13/2017   HDL 35.70 (L) 03/13/2017   LDLDIRECT 142.0 05/26/2014   LDLCALC 161 (H) 03/13/2017   ALT 22 05/26/2014   AST 17 05/26/2014   NA 134 (L) 03/13/2017   K 4.0 03/13/2017   CL 97 03/13/2017   CREATININE 1.08 03/13/2017   BUN 15 03/13/2017   CO2 30 03/13/2017   TSH 1.37 05/26/2014   PSA 2.96 11/12/2012   HGBA1C 8.2 09/11/2017   MICROALBUR 8.5 (H) 11/12/2012    BP Readings from Last 3 Encounters:  03/19/18 122/70  09/11/17 118/68  03/13/17 112/72      ASSESSMENT AND PLAN:  Discussed the following assessment and plan:  Visit for preventive health examination - Plan: Basic metabolic panel, CBC with Differential/Platelet, Hemoglobin A1c, Lipid panel, Microalbumin / creatinine urine ratio, Hepatic function panel  Does not have health  medicine insurance - orange card   Diabetes mellitus without complication ([place]) - Plan: Basic metabolic panel, CBC with Differential/Platelet, Hemoglobin A1c, Lipid panel, Microalbumin / creatinine urine  ratio, Hepatic function panel  Personal history of venous thrombosis and embolism - Plan: Basic metabolic panel, CBC with Differential/Platelet, Hemoglobin A1c, Lipid panel, Microalbumin / creatinine urine ratio, Hepatic function panel  Hyperlipidemia, unspecified hyperlipidemia type - Plan: Basic metabolic panel, CBC with Differential/Platelet, Hemoglobin A1c, Lipid panel, Microalbumin / creatinine urine ratio, Hepatic function panel  Essential hypertension - Plan: Basic metabolic panel, CBC with Differential/Platelet, Hemoglobin A1c, Lipid panel, Microalbumin / creatinine urine ratio, Hepatic function panel  Medication management - Plan: Basic metabolic panel, CBC with Differential/Platelet, Hemoglobin A1c, Lipid panel, Microalbumin / creatinine urine ratio, Hepatic function panel  Statin intolerance - Plan: Basic metabolic panel, CBC with Differential/Platelet, Hemoglobin A1c, Lipid panel, Microalbumin / creatinine urine ratio, Hepatic function panel  Colon cancer screening - Plan: Fecal occult blood, imunochemical disc psa  Will defer at this time  Look into colon screen  Pharmacy support     consider zetia if not a statin  And will prob need more aggressive   Sugar  Control  Get eye check  consider Andrews   jardiance  Patient Care Team: Burnis Medin, MD as PCP - General Patient Instructions  ?Try the lovastatin otherwise  Would try zetia with good rx coupon ( 22$ price range)   Will notify you  of labs when available.   Advise vision check .    Call  case management , see if they cover cologuard or  colonscopy and we can place the order for this.   Also ask about help with medications  Benefits   Do they only help with generic medicines  And can they  help with getting you  Branded medications for diabetes   From manufactures etc. .   Get fasting lab appt  Soon   May try adding cholesterol medicine or other diabets medicine as  Needed .  Plan fu visit   depending on lab  tests.  Preventive Care 40-64 Years, Male Preventive care refers to lifestyle choices and visits with your health care provider that can promote health and wellness. What does preventive care include?  A yearly physical exam. This is also called an annual well check.  Dental exams once or twice a year.  Routine eye exams. Ask your health care provider how often you should have your eyes checked.  Personal lifestyle choices, including: ? Daily care of your teeth and gums. ? Regular physical activity. ? Eating a healthy diet. ? Avoiding tobacco and drug use. ? Limiting alcohol use. ? Practicing safe sex. ? Taking low-dose aspirin every day starting at age 64. What happens during an annual well check? The services and screenings done by your health care provider during your annual well check will depend on your age, overall health, lifestyle risk factors, and family history of disease. Counseling Your health care provider may ask you questions about your:  Alcohol use.  Tobacco use.  Drug use.  Emotional well-being.  Home and relationship well-being.  Sexual activity.  Eating habits.  Work and work Statistician.  Screening You may have the following tests or measurements:  Height, weight, and BMI.  Blood pressure.  Lipid and cholesterol levels. These may be checked every 5 years, or more frequently if you are over 29 years old.  Skin check.  Lung cancer screening. You may have this screening every year starting at age 79 if you have a 30-pack-year history of smoking and currently smoke or have quit within the past 15 years.  Fecal occult blood test (FOBT) of the stool. You may have this test every year starting at age 49.  Flexible sigmoidoscopy or colonoscopy. You may have a sigmoidoscopy every 5 years or a colonoscopy every 10 years starting at age 30.  Prostate cancer screening. Recommendations will vary depending on your family history and other  risks.  Hepatitis C blood test.  Hepatitis B blood test.  Sexually transmitted disease (STD) testing.  Diabetes screening. This is done by checking your blood sugar (glucose) after you have not eaten for a while (fasting). You may have this done every 1-3 years.  Discuss your test results, treatment options, and if necessary, the need for more tests with your health care provider. Vaccines Your health care provider may recommend certain vaccines, such as:  Influenza vaccine. This is recommended every year.  Tetanus, diphtheria, and acellular pertussis (Tdap, Td) vaccine. You may need a Td booster every 10 years.  Varicella vaccine. You may need this if you have not been vaccinated.  Zoster vaccine. You may need this after age 72.  Measles, mumps, and rubella (MMR) vaccine. You may need at least one dose of MMR if you were born in 1957 or later. You may also need a second dose.  Pneumococcal 13-valent conjugate (PCV13) vaccine. You may need this if you have certain conditions and have not been vaccinated.  Pneumococcal polysaccharide (PPSV23) vaccine. You may need one or two doses if you smoke cigarettes or if you have certain conditions.  Meningococcal vaccine. You may need this if you have certain conditions.  Hepatitis A vaccine. You may need this if you have certain conditions or if you travel or work in places where you may be exposed to hepatitis A.  Hepatitis B vaccine. You may need this if you have certain conditions or if you travel or work in places where you may be exposed to hepatitis B.  Haemophilus influenzae type b (Hib) vaccine.  You may need this if you have certain risk factors.  Talk to your health care provider about which screenings and vaccines you need and how often you need them. This information is not intended to replace advice given to you by your health care provider. Make sure you discuss any questions you have with your health care provider. Document  Released: 05/21/2015 Document Revised: 01/12/2016 Document Reviewed: 02/23/2015 Elsevier Interactive Patient Education  2018 [employer].   Diabetes Mellitus and Standards of Medical Care Managing diabetes (diabetes mellitus) can be complicated. Your diabetes treatment may be managed by a team of health care providers, including:  A diet and nutrition specialist (registered dietitian).  A nurse.  A certified diabetes educator (CDE).  A diabetes specialist (endocrinologist).  An eye doctor.  A primary care provider.  A dentist.  Your health care providers follow a schedule in order to help you get the best quality of care. The following schedule is a general guideline for your diabetes management plan. Your health care providers may also give you more specific instructions. HbA1c ( hemoglobin A1c) test This test provides information about blood sugar (glucose) control over the previous 2-3 months. It is used to check whether your diabetes management plan needs to be adjusted.  If you are meeting your treatment goals, this test is done at least 2 times a year.  If you are not meeting treatment goals or if your treatment goals have changed, this test is done 4 times a year.  Blood pressure test  This test is done at every routine medical visit. For most people, the goal is less than 130/80. Ask your health care provider what your goal blood pressure should be. Dental and eye exams  Visit your dentist two times a year.  If you have type 1 diabetes, get an eye exam 3-5 years after you are diagnosed, and then once a year after your first exam. ? If you were diagnosed with type 1 diabetes as a child, get an eye exam when you are age 51 or older and have had diabetes for 3-5 years. After the first exam, you should get an eye exam once a year.  If you have type 2 diabetes, have an eye exam as soon as you are diagnosed, and then once a year after your first exam. Foot care  exam  Visual foot exams are done at every routine medical visit. The exams check for cuts, bruises, redness, blisters, sores, or other problems with the feet.  A complete foot exam is done by your health care provider once a year. This exam includes an inspection of the structure and skin of your feet, and a check of the pulses and sensation in your feet. ? Type 1 diabetes: Get your first exam 3-5 years after diagnosis. ? Type 2 diabetes: Get your first exam as soon as you are diagnosed.  Check your feet every day for cuts, bruises, redness, blisters, or sores. If you have any of these or other problems that are not healing, contact your health care provider. Kidney function test ( urine microalbumin)  This test is done once a year. ? Type 1 diabetes: Get your first test 5 years after diagnosis. ? Type 2 diabetes: Get your first test as soon as you are diagnosed.  If you have chronic kidney disease (CKD), get a serum creatinine and estimated glomerular filtration rate (eGFR) test once a year. Lipid profile (cholesterol, HDL, LDL, triglycerides)  This test should be done when  you are diagnosed with diabetes, and every 5 years after the first test. If you are on medicines to lower your cholesterol, you may need to get this test done every year. ? The goal for LDL is less than 100 mg/dL (5.5 mmol/L). If you are at high risk, the goal is less than 70 mg/dL (3.9 mmol/L). ? The goal for HDL is 40 mg/dL (2.2 mmol/L) for men and 50 mg/dL(2.8 mmol/L) for women. An HDL cholesterol of 60 mg/dL (3.3 mmol/L) or higher gives some protection against heart disease. ? The goal for triglycerides is less than 150 mg/dL (8.3 mmol/L). Immunizations  The yearly flu (influenza) vaccine is recommended for everyone 6 months or older who has diabetes.  The pneumonia (pneumococcal) vaccine is recommended for everyone 2 years or older who has diabetes. If you are 99 or older, you may get the pneumonia vaccine as a  series of two separate shots.  The hepatitis B vaccine is recommended for adults shortly after they have been diagnosed with diabetes.  The Tdap (tetanus, diphtheria, and pertussis) vaccine should be given: ? According to normal childhood vaccination schedules, for children. ? Every 10 years, for adults who have diabetes.  The shingles vaccine is recommended for people who have had chicken pox and are 50 years or older. Mental and emotional health  Screening for symptoms of eating disorders, anxiety, and depression is recommended at the time of diagnosis and afterward as needed. If your screening shows that you have symptoms (you have a positive screening result), you may need further evaluation and be referred to a mental health care provider. Diabetes self-management education  Education about how to manage your diabetes is recommended at diagnosis and ongoing as needed. Treatment plan  Your treatment plan will be reviewed at every medical visit. Summary  Managing diabetes (diabetes mellitus) can be complicated. Your diabetes treatment may be managed by a team of health care providers.  Your health care providers follow a schedule in order to help you get the best quality of care.  Standards of care including having regular physical exams, blood tests, blood pressure monitoring, immunizations, screening tests, and education about how to manage your diabetes.  Your health care providers may also give you more specific instructions based on your individual health. This information is not intended to replace advice given to you by your health care provider. Make sure you discuss any questions you have with your health care provider. Document Released: 02/19/2009 Document Revised: 01/21/2016 Document Reviewed: 01/21/2016 Elsevier Interactive Patient Education  2018 [employer]. Jacques Willingham M.D.

## 2018-03-19 ENCOUNTER — Encounter: Payer: Self-pay | Admitting: Internal Medicine

## 2018-03-19 ENCOUNTER — Ambulatory Visit (INDEPENDENT_AMBULATORY_CARE_PROVIDER_SITE_OTHER): Payer: No Typology Code available for payment source | Admitting: Internal Medicine

## 2018-03-19 VITALS — BP 122/70 | HR 90 | Temp 98.1°F | Ht 71.85 in | Wt 202.1 lb

## 2018-03-19 DIAGNOSIS — E119 Type 2 diabetes mellitus without complications: Secondary | ICD-10-CM

## 2018-03-19 DIAGNOSIS — Z0000 Encounter for general adult medical examination without abnormal findings: Secondary | ICD-10-CM

## 2018-03-19 DIAGNOSIS — Z86718 Personal history of other venous thrombosis and embolism: Secondary | ICD-10-CM

## 2018-03-19 DIAGNOSIS — Z79899 Other long term (current) drug therapy: Secondary | ICD-10-CM

## 2018-03-19 DIAGNOSIS — I10 Essential (primary) hypertension: Secondary | ICD-10-CM

## 2018-03-19 DIAGNOSIS — Z789 Other specified health status: Secondary | ICD-10-CM

## 2018-03-19 DIAGNOSIS — E785 Hyperlipidemia, unspecified: Secondary | ICD-10-CM

## 2018-03-19 DIAGNOSIS — Z1211 Encounter for screening for malignant neoplasm of colon: Secondary | ICD-10-CM

## 2018-03-19 DIAGNOSIS — Z5989 Other problems related to housing and economic circumstances: Secondary | ICD-10-CM

## 2018-03-19 DIAGNOSIS — Z598 Other problems related to housing and economic circumstances: Secondary | ICD-10-CM

## 2018-03-19 NOTE — Patient Instructions (Addendum)
?Try the lovastatin otherwise  Would try zetia with good rx coupon ( 22$ price range)   Will notify you  of labs when available.   Advise vision check .    Call  case management , see if they cover cologuard or  colonscopy and we can place the order for this.   Also ask about help with medications  Benefits   Do they only help with generic medicines  And can they  help with getting you  Branded medications for diabetes   From manufactures etc. .   Get fasting lab appt  Soon   May try adding cholesterol medicine or other diabets medicine as  Needed .  Plan fu visit   depending on lab tests.    Preventive Care 40-64 Years, Male Preventive care refers to lifestyle choices and visits with your health care provider that can promote health and wellness. What does preventive care include?  A yearly physical exam. This is also called an annual well check.  Dental exams once or twice a year.  Routine eye exams. Ask your health care provider how often you should have your eyes checked.  Personal lifestyle choices, including: ? Daily care of your teeth and gums. ? Regular physical activity. ? Eating a healthy diet. ? Avoiding tobacco and drug use. ? Limiting alcohol use. ? Practicing safe sex. ? Taking low-dose aspirin every day starting at age 38. What happens during an annual well check? The services and screenings done by your health care provider during your annual well check will depend on your age, overall health, lifestyle risk factors, and family history of disease. Counseling Your health care provider may ask you questions about your:  Alcohol use.  Tobacco use.  Drug use.  Emotional well-being.  Home and relationship well-being.  Sexual activity.  Eating habits.  Work and work Statistician.  Screening You may have the following tests or measurements:  Height, weight, and BMI.  Blood pressure.  Lipid and cholesterol levels. These may be checked every 5  years, or more frequently if you are over 71 years old.  Skin check.  Lung cancer screening. You may have this screening every year starting at age 58 if you have a 30-pack-year history of smoking and currently smoke or have quit within the past 15 years.  Fecal occult blood test (FOBT) of the stool. You may have this test every year starting at age 82.  Flexible sigmoidoscopy or colonoscopy. You may have a sigmoidoscopy every 5 years or a colonoscopy every 10 years starting at age 50.  Prostate cancer screening. Recommendations will vary depending on your family history and other risks.  Hepatitis C blood test.  Hepatitis B blood test.  Sexually transmitted disease (STD) testing.  Diabetes screening. This is done by checking your blood sugar (glucose) after you have not eaten for a while (fasting). You may have this done every 1-3 years.  Discuss your test results, treatment options, and if necessary, the need for more tests with your health care provider. Vaccines Your health care provider may recommend certain vaccines, such as:  Influenza vaccine. This is recommended every year.  Tetanus, diphtheria, and acellular pertussis (Tdap, Td) vaccine. You may need a Td booster every 10 years.  Varicella vaccine. You may need this if you have not been vaccinated.  Zoster vaccine. You may need this after age 6.  Measles, mumps, and rubella (MMR) vaccine. You may need at least one dose of MMR if you were  born in [birth year] or later. You may also need a second dose.  Pneumococcal 13-valent conjugate (PCV13) vaccine. You may need this if you have certain conditions and have not been vaccinated.  Pneumococcal polysaccharide (PPSV23) vaccine. You may need one or two doses if you smoke cigarettes or if you have certain conditions.  Meningococcal vaccine. You may need this if you have certain conditions.  Hepatitis A vaccine. You may need this if you have certain conditions or if you travel or  work in places where you may be exposed to hepatitis A.  Hepatitis B vaccine. You may need this if you have certain conditions or if you travel or work in places where you may be exposed to hepatitis B.  Haemophilus influenzae type b (Hib) vaccine. You may need this if you have certain risk factors.  Talk to your health care provider about which screenings and vaccines you need and how often you need them. This information is not intended to replace advice given to you by your health care provider. Make sure you discuss any questions you have with your health care provider. Document Released: 05/21/2015 Document Revised: 01/12/2016 Document Reviewed: 02/23/2015 Elsevier Interactive Patient Education  2018 [employer].   Diabetes Mellitus and Standards of Medical Care Managing diabetes (diabetes mellitus) can be complicated. Your diabetes treatment may be managed by a team of health care providers, including:  A diet and nutrition specialist (registered dietitian).  A nurse.  A certified diabetes educator (CDE).  A diabetes specialist (endocrinologist).  An eye doctor.  A primary care provider.  A dentist.  Your health care providers follow a schedule in order to help you get the best quality of care. The following schedule is a general guideline for your diabetes management plan. Your health care providers may also give you more specific instructions. HbA1c ( hemoglobin A1c) test This test provides information about blood sugar (glucose) control over the previous 2-3 months. It is used to check whether your diabetes management plan needs to be adjusted.  If you are meeting your treatment goals, this test is done at least 2 times a year.  If you are not meeting treatment goals or if your treatment goals have changed, this test is done 4 times a year.  Blood pressure test  This test is done at every routine medical visit. For most people, the goal is less than 130/80. Ask your  health care provider what your goal blood pressure should be. Dental and eye exams  Visit your dentist two times a year.  If you have type 1 diabetes, get an eye exam 3-5 years after you are diagnosed, and then once a year after your first exam. ? If you were diagnosed with type 1 diabetes as a child, get an eye exam when you are age 18 or older and have had diabetes for 3-5 years. After the first exam, you should get an eye exam once a year.  If you have type 2 diabetes, have an eye exam as soon as you are diagnosed, and then once a year after your first exam. Foot care exam  Visual foot exams are done at every routine medical visit. The exams check for cuts, bruises, redness, blisters, sores, or other problems with the feet.  A complete foot exam is done by your health care provider once a year. This exam includes an inspection of the structure and skin of your feet, and a check of the pulses and sensation in your feet. ?  Type 1 diabetes: Get your first exam 3-5 years after diagnosis. ? Type 2 diabetes: Get your first exam as soon as you are diagnosed.  Check your feet every day for cuts, bruises, redness, blisters, or sores. If you have any of these or other problems that are not healing, contact your health care provider. Kidney function test ( urine microalbumin)  This test is done once a year. ? Type 1 diabetes: Get your first test 5 years after diagnosis. ? Type 2 diabetes: Get your first test as soon as you are diagnosed.  If you have chronic kidney disease (CKD), get a serum creatinine and estimated glomerular filtration rate (eGFR) test once a year. Lipid profile (cholesterol, HDL, LDL, triglycerides)  This test should be done when you are diagnosed with diabetes, and every 5 years after the first test. If you are on medicines to lower your cholesterol, you may need to get this test done every year. ? The goal for LDL is less than 100 mg/dL (5.5 mmol/L). If you are at high  risk, the goal is less than 70 mg/dL (3.9 mmol/L). ? The goal for HDL is 40 mg/dL (2.2 mmol/L) for men and 50 mg/dL(2.8 mmol/L) for women. An HDL cholesterol of 60 mg/dL (3.3 mmol/L) or higher gives some protection against heart disease. ? The goal for triglycerides is less than 150 mg/dL (8.3 mmol/L). Immunizations  The yearly flu (influenza) vaccine is recommended for everyone 6 months or older who has diabetes.  The pneumonia (pneumococcal) vaccine is recommended for everyone 2 years or older who has diabetes. If you are 55 or older, you may get the pneumonia vaccine as a series of two separate shots.  The hepatitis B vaccine is recommended for adults shortly after they have been diagnosed with diabetes.  The Tdap (tetanus, diphtheria, and pertussis) vaccine should be given: ? According to normal childhood vaccination schedules, for children. ? Every 10 years, for adults who have diabetes.  The shingles vaccine is recommended for people who have had chicken pox and are 50 years or older. Mental and emotional health  Screening for symptoms of eating disorders, anxiety, and depression is recommended at the time of diagnosis and afterward as needed. If your screening shows that you have symptoms (you have a positive screening result), you may need further evaluation and be referred to a mental health care provider. Diabetes self-management education  Education about how to manage your diabetes is recommended at diagnosis and ongoing as needed. Treatment plan  Your treatment plan will be reviewed at every medical visit. Summary  Managing diabetes (diabetes mellitus) can be complicated. Your diabetes treatment may be managed by a team of health care providers.  Your health care providers follow a schedule in order to help you get the best quality of care.  Standards of care including having regular physical exams, blood tests, blood pressure monitoring, immunizations, screening tests,  and education about how to manage your diabetes.  Your health care providers may also give you more specific instructions based on your individual health. This information is not intended to replace advice given to you by your health care provider. Make sure you discuss any questions you have with your health care provider. Document Released: 02/19/2009 Document Revised: 01/21/2016 Document Reviewed: 01/21/2016 Elsevier Interactive Patient Education  [employer].

## 2018-03-26 ENCOUNTER — Other Ambulatory Visit: Payer: No Typology Code available for payment source

## 2018-04-02 ENCOUNTER — Other Ambulatory Visit (INDEPENDENT_AMBULATORY_CARE_PROVIDER_SITE_OTHER): Payer: No Typology Code available for payment source

## 2018-04-02 DIAGNOSIS — Z789 Other specified health status: Secondary | ICD-10-CM

## 2018-04-02 DIAGNOSIS — Z86718 Personal history of other venous thrombosis and embolism: Secondary | ICD-10-CM

## 2018-04-02 DIAGNOSIS — Z0000 Encounter for general adult medical examination without abnormal findings: Secondary | ICD-10-CM

## 2018-04-02 DIAGNOSIS — I10 Essential (primary) hypertension: Secondary | ICD-10-CM

## 2018-04-02 DIAGNOSIS — Z79899 Other long term (current) drug therapy: Secondary | ICD-10-CM

## 2018-04-02 DIAGNOSIS — E785 Hyperlipidemia, unspecified: Secondary | ICD-10-CM

## 2018-04-02 DIAGNOSIS — E119 Type 2 diabetes mellitus without complications: Secondary | ICD-10-CM

## 2018-04-02 LAB — BASIC METABOLIC PANEL
BUN: 11 mg/dL (ref 6–23)
CALCIUM: 8.9 mg/dL (ref 8.4–10.5)
CO2: 29 mEq/L (ref 19–32)
Chloride: 98 mEq/L (ref 96–112)
Creatinine, Ser: 0.96 mg/dL (ref 0.40–1.50)
GFR: 103.2 mL/min (ref >60.00)
GLUCOSE: 233 mg/dL — AB (ref 70–99)
POTASSIUM: 3.9 meq/L (ref 3.5–5.1)
Sodium: 134 mEq/L — ABNORMAL LOW (ref 135–145)

## 2018-04-02 LAB — CBC WITH DIFFERENTIAL/PLATELET
BASOS ABS: 0 10*3/uL (ref 0.0–0.1)
Basophils Relative: 0.6 % (ref 0.0–3.0)
Eosinophils Absolute: 0.1 10*3/uL (ref 0.0–0.7)
Eosinophils Relative: 1.3 % (ref 0.0–5.0)
HCT: 43.3 % (ref 39.0–52.0)
Hemoglobin: 14.6 g/dL (ref 13.0–17.0)
Lymphocytes Relative: 35.5 % (ref 12.0–46.0)
Lymphs Abs: 2 10*3/uL (ref 0.7–4.0)
MCHC: 33.8 g/dL (ref 30.0–36.0)
MCV: 95.7 fl (ref 78.0–100.0)
MONOS PCT: 9.7 % (ref 3.0–12.0)
Monocytes Absolute: 0.5 10*3/uL (ref 0.1–1.0)
NEUTROS ABS: 2.9 10*3/uL (ref 1.4–7.7)
Neutrophils Relative %: 52.9 % (ref 43.0–77.0)
Platelets: 265 10*3/uL (ref 150.0–400.0)
RBC: 4.52 Mil/uL (ref 4.22–5.81)
RDW: 12.8 % (ref 11.5–15.5)
WBC: 5.5 10*3/uL (ref 4.0–10.5)

## 2018-04-02 LAB — HEPATIC FUNCTION PANEL
ALK PHOS: 75 U/L (ref 39–117)
ALT: 24 U/L (ref 0–53)
AST: 14 U/L (ref 0–37)
Albumin: 4.2 g/dL (ref 3.5–5.2)
Bilirubin, Direct: 0.1 mg/dL (ref 0.0–0.3)
TOTAL PROTEIN: 6.7 g/dL (ref 6.0–8.3)
Total Bilirubin: 0.7 mg/dL (ref 0.2–1.2)

## 2018-04-02 LAB — LIPID PANEL
CHOL/HDL RATIO: 5
Cholesterol: 179 mg/dL (ref 0–200)
HDL: 38 mg/dL — AB (ref >39.00)
LDL CALC: 125 mg/dL — AB (ref 0–99)
NonHDL: 140.97
TRIGLYCERIDES: 79 mg/dL (ref 0.0–149.0)
VLDL: 15.8 mg/dL (ref 0.0–40.0)

## 2018-04-02 LAB — MICROALBUMIN / CREATININE URINE RATIO
Creatinine,U: 198.1 mg/dL
MICROALB/CREAT RATIO: 5.5 mg/g (ref 0.0–30.0)
Microalb, Ur: 10.8 mg/dL — ABNORMAL HIGH (ref 0.0–1.9)

## 2018-04-02 LAB — HEMOGLOBIN A1C: Hgb A1c MFr Bld: 8.4 % — ABNORMAL HIGH (ref 4.6–6.5)

## 2018-04-09 ENCOUNTER — Other Ambulatory Visit: Payer: Self-pay | Admitting: Internal Medicine

## 2018-05-28 ENCOUNTER — Other Ambulatory Visit: Payer: Self-pay | Admitting: Internal Medicine

## 2018-07-23 ENCOUNTER — Other Ambulatory Visit: Payer: Self-pay | Admitting: Internal Medicine

## 2018-07-31 ENCOUNTER — Telehealth: Payer: Self-pay

## 2018-07-31 NOTE — Telephone Encounter (Signed)
Left voicemail for pt to cal back to offer webex visit instead. Crm created.

## 2018-08-05 ENCOUNTER — Ambulatory Visit: Payer: No Typology Code available for payment source | Admitting: Internal Medicine

## 2018-09-17 ENCOUNTER — Telehealth: Payer: Self-pay

## 2018-09-17 NOTE — Telephone Encounter (Signed)
lvm for pt to call back pt is due for a f/u visit crm created

## 2018-10-05 ENCOUNTER — Other Ambulatory Visit: Payer: Self-pay | Admitting: Internal Medicine

## 2018-11-25 ENCOUNTER — Other Ambulatory Visit: Payer: Self-pay | Admitting: Internal Medicine

## 2018-12-04 ENCOUNTER — Other Ambulatory Visit: Payer: Self-pay | Admitting: Internal Medicine

## 2019-01-04 ENCOUNTER — Other Ambulatory Visit: Payer: Self-pay | Admitting: Internal Medicine

## 2019-01-06 NOTE — Telephone Encounter (Signed)
Patient need to schedule an ov for more refills. Called pt no answer left detailed message. Rx will be refilled for 30 days.

## 2019-02-06 ENCOUNTER — Other Ambulatory Visit: Payer: Self-pay | Admitting: Internal Medicine

## 2019-03-09 ENCOUNTER — Other Ambulatory Visit: Payer: Self-pay | Admitting: Internal Medicine

## 2019-03-18 NOTE — Progress Notes (Signed)
Chief Complaint  Patient presents with  . Annual Exam    Pt has no concerns     HPI: Patient  Bobby Obrien  58 y.o. comes in today for Preventive Health Care visit  Still no reg insurance   DM  Checking bg recnetly and most under 180   Doing better now taking 2000 metformin per day HT  I ncontrol no se of med  Lipids  Is statin intoleracnt   4 statins tried ? No eye exam yet  Feet toes feel off sometimes but not reall numb. Never did the stool tests for  Colon screen  No fam hx   Health Maintenance  Topic Date Due  . Hepatitis C Screening  [birth year]  . PNEUMOCOCCAL POLYSACCHARIDE VACCINE AGE 1-64 HIGH RISK  07/14/1961  . HIV Screening  07/15/1974  . COLONOSCOPY  07/14/2009  . OPHTHALMOLOGY EXAM  03/14/2015  . HEMOGLOBIN A1C  10/01/2018  . INFLUENZA VACCINE  08/06/2019 (Originally 12/07/2018)  . FOOT EXAM  03/20/2019  . TETANUS/TDAP  06/21/2020   Health Maintenance Review LIFESTYLE:  Exercise:  walks Tobacco/ETS:n Alcohol:  Sugar beverages:n Sleep:ok Drug use: no HH of 2 Work: not currently in person  sometims on line     ROS:  GEN/ HEENT: No fever, significant weight changes sweats headaches vision problems hearing changes, when had pancackes and syrup had tem vision cchange  CV/ PULM; No chest pain shortness of breath cough, syncope,edema  change in exercise tolerance. GI /GU: No adominal pain, vomiting, change in bowel habits. No blood in the stool. No significant GU symptoms. SKIN/HEME: ,no acute skin rashes suspicious lesions or bleeding. No lymphadenopathy, nodules, masses.  NEURO/ PSYCH:  No neurologic signs such as weakness numbness. No depression anxiety. IMM/ Allergy: No unusual infections.  Allergy .   REST of 12 system review negative except as per HPI   Past Medical History:  Diagnosis Date  . CONGESTIVE HEART FAILURE 12/04/2006   2005 Dec lvf echo 50-55% ef 2007  . DIABETES MELLITUS, TYPE II 12/04/2006  . History of DVT (deep vein thrombosis)     with PE 12 2005  . History of pulmonary embolism    73 05    with HF and hypertension  . HYPERLIPIDEMIA 12/04/2006  . HYPERTENSION 12/04/2006  . PANIC DISORDER 12/04/2006  . Personal history of venous thrombosis and embolism 12/04/2006    History reviewed. No pertinent surgical history.  Family History  Problem Relation Age of Onset  . Kidney disease Mother   . Hypertension Mother   . Hypertension Father   . Heart attack Father     Social History   Socioeconomic History  . Marital status: Married    Spouse name: Not on file  . Number of children: Not on file  . Years of education: Not on file  . Highest education level: Not on file  Occupational History  . Not on file  Social Needs  . Financial resource strain: Not on file  . Food insecurity    Worry: Not on file    Inability: Not on file  . Transportation needs    Medical: Not on file    Non-medical: Not on file  Tobacco Use  . Smoking status: Never Smoker  . Smokeless tobacco: Never Used  Substance and Sexual Activity  . Alcohol use: Yes  . Drug use: No  . Sexual activity: Not on file  Lifestyle  . Physical activity    Days per week: Not on  file    Minutes per session: Not on file  . Stress: Not on file  Relationships  . Social Herbalist on phone: Not on file    Gets together: Not on file    Attends religious service: Not on file    Active member of club or organization: Not on file    Attends meetings of clubs or organizations: Not on file    Relationship status: Not on file  Other Topics Concern  . Not on file  Social History Narrative   HH of 2 married    Musician   Working evening arenas    [place]    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           Outpatient Medications Prior to Visit  Medication Sig Dispense Refill  . aspirin 81 MG tablet Take 81 mg by mouth daily.      Marland Kitchen lisinopril-hydrochlorothiazide (ZESTORETIC) 20-25 MG tablet TAKE ONE TABLET BY MOUTH DAILY 90 tablet 0  . metFORMIN  (GLUCOPHAGE) 500 MG tablet TAKE FOUR TABLETS BY MOUTH DAILY WITH BREAKFAST 120 tablet 0   No facility-administered medications prior to visit.      EXAM:  BP 122/62 (BP Location: Right Arm, Patient Position: Sitting, Cuff Size: Normal)   Pulse 85   Temp 97.8 F (36.6 C) (Temporal)   Ht 6' (1.829 m)   Wt 199 lb 12.8 oz (90.6 kg)   SpO2 99%   BMI 27.10 kg/m   Body mass index is 27.1 kg/m. Wt Readings from Last 3 Encounters:  03/19/19 199 lb 12.8 oz (90.6 kg)  03/19/18 202 lb 1.6 oz (91.7 kg)  09/11/17 206 lb 12.8 oz (93.8 kg)    Physical Exam: Vital signs reviewed [id number] is a well-developed well-nourished alert cooperative    who appearsr stated age in no acute distress.  HEENT: normocephalic atraumatic , Eyes: PERRL EOM's full, conjunctiva clear, Nares: paten,t no deformity discharge or tenderness., Ears: no deformity EAC's clear TMs with normal landmarks. Mouth:masked  NECK: supple without masses, thyromegaly or bruits. CHEST/PULM:  Clear to auscultation and percussion breath sounds equal no wheeze , rales or rhonchi. No chest wall deformities or tenderness.  . CV: PMI is nondisplaced, S1 S2 no gallops, murmurs, rubs. Peripheral pulses are full without delay.No JVD .  ABDOMEN: Bowel sounds normal nontender  No guard or rebound, no hepato splenomegal no CVA tenderness.  Extremtities:  No clubbing cyanosis or edema, no acute joint swelling or redness no focal atrophy NEURO:  Oriented x3, cranial nerves 3-12 appear to be intact, no obvious focal weakness,gait within normal limits no abnormal reflexes or asymmetrical SKIN: No acute rashes normal turgor, color, no bruising or petechiae. PSYCH: Oriented, good eye contact, no obvious depression anxiety, cognition and judgment appear normal. LN: no cervical axillary inguinal adenopathy Diabetic Foot Exam - Simple   Simple Foot Form Diabetic Foot exam was performed with the following findings: Yes 03/19/2019  2:06 PM  Visual  Inspection No deformities, no ulcerations, no other skin breakdown bilaterally: Yes Sensation Testing Intact to touch and monofilament testing bilaterally: Yes Pulse Check Posterior Tibialis and Dorsalis pulse intact bilaterally: Yes Comments     Lab Results  Component Value Date   WBC 5.5 04/02/2018   HGB 14.6 04/02/2018   HCT 43.3 04/02/2018   PLT 265.0 04/02/2018   GLUCOSE 233 (H) 04/02/2018   CHOL 179 04/02/2018   TRIG 79.0 04/02/2018   HDL 38.00 (L) 04/02/2018   LDLDIRECT 142.0 05/26/2014  LDLCALC 125 (H) 04/02/2018   ALT 24 04/02/2018   AST 14 04/02/2018   NA 134 (L) 04/02/2018   K 3.9 04/02/2018   CL 98 04/02/2018   CREATININE 0.96 04/02/2018   BUN 11 04/02/2018   CO2 29 04/02/2018   TSH 1.37 05/26/2014   PSA 2.96 11/12/2012   HGBA1C 8.4 (H) 04/02/2018   MICROALBUR 10.8 (H) 04/02/2018    BP Readings from Last 3 Encounters:  03/19/19 122/62  03/19/18 122/70  09/11/17 118/68    Lab plan reviewed with patient   He ate  chicken soup and tuna at lunch  And is not  fasting  ASSESSMENT AND PLAN:  Discussed the following assessment and plan:    ICD-10-CM   1. Visit for preventive health examination  123456 Basic metabolic panel    CBC with Differential    Hemoglobin A1c    Lipid panel    Hepatic function panel  2. Statin intolerance  Z78.9   3. Diabetes mellitus without complication (HCC)  [id number] Basic metabolic panel    CBC with Differential    Hemoglobin A1c    Lipid panel    PSA    Hepatic function panel    Microalbumin / creatinine urine ratio   medicine cost a factor  no med coverage . has some feet sx uncertain etiology  4. Hyperlipidemia, unspecified hyperlipidemia type  99991111 Basic metabolic panel    CBC with Differential    Hemoglobin A1c    Lipid panel    Hepatic function panel  5. Essential hypertension  99991111 Basic metabolic panel    CBC with Differential    Hemoglobin A1c    Lipid panel    Hepatic function panel  6. Screening PSA  (prostate specific antigen)  Z12.5 PSA  7. Influenza vaccination declined by patient  Z28.21   8. Pneumococcal vaccination declined by patient  Z28.21   declines immunizations   Pneumovax and flu   Colon eye      Due  Disc colon screening and risk of not  He will check on  Coverage  At least do stool cards  pays for all meds     Try on good rx for zetia  Disc   Dm lipids  And bp  Patient Care Team: Burnis Medin, MD as PCP - General Patient Instructions  Glad you are doing well. Blood sugar goals below 120 fasting below 180 preferably 150 after eating.  Consider trying Zetia for cholesterol that is not a statin medicine so less likely to cause muscle aches. Can look at good Rx coupons and I will print out a prescription.  I want you to get colon cancer screening call your plan and see what they will pay for  If we need to add other blood sugar medicines will have to decide which ones are affordable for you. The Vania Rea is a great medicine but I do not know the cost and if there are other coupons for you  Please get an eye check when you can Plan follow-up visit in about 6 months for blood sugar check or earlier if needed.   Colorectal Cancer Screening  Colorectal cancer screening is a group of tests that are used to check for colorectal cancer before symptoms develop. Colorectal refers to the colon and rectum. The colon and rectum are located at the end of the digestive tract and carry bowel movements out of the body. Who should have screening? All adults starting at age 69 until  age 81 should have screening. Your health care provider may recommend screening at age 9. You will have tests every 1-10 years, depending on your results and the type of screening test. You may have screening tests starting at an earlier age, or more frequently than other people, if you have any of the following risk factors:  A personal or family history of colorectal cancer or abnormal growths  (polyps).  Inflammatory bowel disease, such as ulcerative colitis or Crohn's disease.  A history of having radiation treatment to the abdomen or pelvic area for cancer.  Colorectal cancer symptoms, such as changes in bowel habits or blood in your stool.  A type of colon cancer syndrome that is passed from parent to child (hereditary), such as: ? Lynch syndrome. ? Familial adenomatous polyposis. ? Turcot syndrome. ? Peutz-Jeghers syndrome. Screening recommendations for adults who are 76-93 years old vary depending on health. How is screening done? There are several types of colorectal screening tests. You may have one or more of the following:  Guaiac-based fecal occult blood testing. For this test, a stool (feces) sample is checked for hidden (occult) blood, which could be a sign of colorectal cancer.  Fecal immunochemical test (FIT). For this test, a stool sample is checked for blood, which could be a sign of colorectal cancer.  Stool DNA test. For this test, a stool sample is checked for blood and changes in DNA that could lead to colorectal cancer.  Sigmoidoscopy. During this test, a thin, flexible tube with a camera on the end (sigmoidoscope) is used to examine the rectum and the lower colon.  Colonoscopy. During this test, a long, flexible tube with a camera on the end (colonoscope) is used to examine the entire colon and rectum. With a colonoscopy, it is possible to take a sample of tissue (biopsy) and remove small polyps during the test.  Virtual colonoscopy. Instead of a colonoscope, this type of colonoscopy uses X-rays (CT scan) and computers to produce images of the colon and rectum. What are the benefits of screening? Screening reduces your risk for colorectal cancer and can help identify cancer at an early stage, when the cancer can be removed or treated more easily. It is common for polyps to form in the lining of the colon, especially as you age. These polyps may be  cancerous or become cancerous over time. Screening can identify these polyps. What are the risks of screening? Each screening test may have different risks.  Stool sample tests have fewer risks than other types of screening tests. However, you may need more tests to confirm results from a stool sample test.  Screening tests that involve X-rays expose you to low levels of radiation, which may slightly increase your cancer risk. The benefit of detecting cancer outweighs the slight increase in risk.  Screening tests such as sigmoidoscopy and colonoscopy may place you at risk for bleeding, intestinal damage, infection, or a reaction to medicines given during the exam. Talk with your health care provider to understand your risk for colorectal cancer and to make a screening plan that is right for you. Questions to ask your health care provider  When should I start colorectal cancer screening?  What is my risk for colorectal cancer?  How often do I need screening?  Which screening tests do I need?  How do I get my test results?  What do my results mean? Where to find more information Learn more about colorectal cancer screening from:  The American Cancer  Society: www.cancer.org  The [employer]: www.cancer.gov Summary  Colorectal cancer screening is a group of tests used to check for colorectal cancer before symptoms develop.  Screening reduces your risk for colorectal cancer and can help identify cancer at an early stage, when the cancer can be removed or treated more easily.  All adults starting at age 41 until age 68 should have screening. Your health care provider may recommend screening at age 59.  You may have screening tests starting at an earlier age, or more frequently than other people, if you have certain risk factors.  Talk with your health care provider to understand your risk for colorectal cancer and to make a screening plan that is right for you. This  information is not intended to replace advice given to you by your health care provider. Make sure you discuss any questions you have with your health care provider. Document Released: 10/12/2009 Document Revised: 08/14/2018 Document Reviewed: 01/24/2017 Elsevier Patient Education  2020 [place] Marcellene Shivley M.D.

## 2019-03-19 ENCOUNTER — Other Ambulatory Visit: Payer: Self-pay

## 2019-03-19 ENCOUNTER — Ambulatory Visit (INDEPENDENT_AMBULATORY_CARE_PROVIDER_SITE_OTHER): Payer: No Typology Code available for payment source | Admitting: Internal Medicine

## 2019-03-19 ENCOUNTER — Encounter: Payer: Self-pay | Admitting: Internal Medicine

## 2019-03-19 VITALS — BP 122/62 | HR 85 | Temp 97.8°F | Ht 72.0 in | Wt 199.8 lb

## 2019-03-19 DIAGNOSIS — E785 Hyperlipidemia, unspecified: Secondary | ICD-10-CM

## 2019-03-19 DIAGNOSIS — Z2821 Immunization not carried out because of patient refusal: Secondary | ICD-10-CM

## 2019-03-19 DIAGNOSIS — Z0000 Encounter for general adult medical examination without abnormal findings: Secondary | ICD-10-CM

## 2019-03-19 DIAGNOSIS — I10 Essential (primary) hypertension: Secondary | ICD-10-CM

## 2019-03-19 DIAGNOSIS — Z789 Other specified health status: Secondary | ICD-10-CM

## 2019-03-19 DIAGNOSIS — E119 Type 2 diabetes mellitus without complications: Secondary | ICD-10-CM

## 2019-03-19 DIAGNOSIS — Z125 Encounter for screening for malignant neoplasm of prostate: Secondary | ICD-10-CM

## 2019-03-19 MED ORDER — EZETIMIBE 10 MG PO TABS: 10.0000 mg | ORAL_TABLET | Freq: Every day | ORAL | 6 refills | Status: DC

## 2019-03-19 NOTE — Patient Instructions (Addendum)
Glad you are doing well. Blood sugar goals below 120 fasting below 180 preferably 150 after eating.  Consider trying Zetia for cholesterol that is not a statin medicine so less likely to cause muscle aches. Can look at good Rx coupons and I will print out a prescription.  I want you to get colon cancer screening call your plan and see what they will pay for  If we need to add other blood sugar medicines will have to decide which ones are affordable for you. The Vania Rea is a great medicine but I do not know the cost and if there are other coupons for you  Please get an eye check when you can Plan follow-up visit in about 6 months for blood sugar check or earlier if needed.   Colorectal Cancer Screening  Colorectal cancer screening is a group of tests that are used to check for colorectal cancer before symptoms develop. Colorectal refers to the colon and rectum. The colon and rectum are located at the end of the digestive tract and carry bowel movements out of the body. Who should have screening? All adults starting at age 31 until age 66 should have screening. Your health care provider may recommend screening at age 27. You will have tests every 1-10 years, depending on your results and the type of screening test. You may have screening tests starting at an earlier age, or more frequently than other people, if you have any of the following risk factors:  A personal or family history of colorectal cancer or abnormal growths (polyps).  Inflammatory bowel disease, such as ulcerative colitis or Crohn's disease.  A history of having radiation treatment to the abdomen or pelvic area for cancer.  Colorectal cancer symptoms, such as changes in bowel habits or blood in your stool.  A type of colon cancer syndrome that is passed from parent to child (hereditary), such as: ? Lynch syndrome. ? Familial adenomatous polyposis. ? Turcot syndrome. ? Peutz-Jeghers syndrome. Screening  recommendations for adults who are 53-48 years old vary depending on health. How is screening done? There are several types of colorectal screening tests. You may have one or more of the following:  Guaiac-based fecal occult blood testing. For this test, a stool (feces) sample is checked for hidden (occult) blood, which could be a sign of colorectal cancer.  Fecal immunochemical test (FIT). For this test, a stool sample is checked for blood, which could be a sign of colorectal cancer.  Stool DNA test. For this test, a stool sample is checked for blood and changes in DNA that could lead to colorectal cancer.  Sigmoidoscopy. During this test, a thin, flexible tube with a camera on the end (sigmoidoscope) is used to examine the rectum and the lower colon.  Colonoscopy. During this test, a long, flexible tube with a camera on the end (colonoscope) is used to examine the entire colon and rectum. With a colonoscopy, it is possible to take a sample of tissue (biopsy) and remove small polyps during the test.  Virtual colonoscopy. Instead of a colonoscope, this type of colonoscopy uses X-rays (CT scan) and computers to produce images of the colon and rectum. What are the benefits of screening? Screening reduces your risk for colorectal cancer and can help identify cancer at an early stage, when the cancer can be removed or treated more easily. It is common for polyps to form in the lining of the colon, especially as you age. These polyps may be cancerous or become cancerous  over time. Screening can identify these polyps. What are the risks of screening? Each screening test may have different risks.  Stool sample tests have fewer risks than other types of screening tests. However, you may need more tests to confirm results from a stool sample test.  Screening tests that involve X-rays expose you to low levels of radiation, which may slightly increase your cancer risk. The benefit of detecting cancer  outweighs the slight increase in risk.  Screening tests such as sigmoidoscopy and colonoscopy may place you at risk for bleeding, intestinal damage, infection, or a reaction to medicines given during the exam. Talk with your health care provider to understand your risk for colorectal cancer and to make a screening plan that is right for you. Questions to ask your health care provider  When should I start colorectal cancer screening?  What is my risk for colorectal cancer?  How often do I need screening?  Which screening tests do I need?  How do I get my test results?  What do my results mean? Where to find more information Learn more about colorectal cancer screening from:  The [employer]: www.cancer.org  The [employer]: www.cancer.gov Summary  Colorectal cancer screening is a group of tests used to check for colorectal cancer before symptoms develop.  Screening reduces your risk for colorectal cancer and can help identify cancer at an early stage, when the cancer can be removed or treated more easily.  All adults starting at age 33 until age 65 should have screening. Your health care provider may recommend screening at age 53.  You may have screening tests starting at an earlier age, or more frequently than other people, if you have certain risk factors.  Talk with your health care provider to understand your risk for colorectal cancer and to make a screening plan that is right for you. This information is not intended to replace advice given to you by your health care provider. Make sure you discuss any questions you have with your health care provider. Document Released: 10/12/2009 Document Revised: 08/14/2018 Document Reviewed: 01/24/2017 Elsevier Patient Education  2020 [employer].

## 2019-03-20 LAB — HEPATIC FUNCTION PANEL
ALT: 23 U/L (ref 0–53)
AST: 19 U/L (ref 0–37)
Albumin: 4.4 g/dL (ref 3.5–5.2)
Alkaline Phosphatase: 70 U/L (ref 39–117)
Bilirubin, Direct: 0.1 mg/dL (ref 0.0–0.3)
Total Bilirubin: 0.6 mg/dL (ref 0.2–1.2)
Total Protein: 7 g/dL (ref 6.0–8.3)

## 2019-03-20 LAB — BASIC METABOLIC PANEL
BUN: 13 mg/dL (ref 6–23)
CO2: 29 mEq/L (ref 19–32)
Calcium: 9.5 mg/dL (ref 8.4–10.5)
Chloride: 100 mEq/L (ref 96–112)
Creatinine, Ser: 1.13 mg/dL (ref 0.40–1.50)
GFR: 80.18 mL/min (ref >60.00)
Glucose, Bld: 176 mg/dL — ABNORMAL HIGH (ref 70–99)
Potassium: 4.3 mEq/L (ref 3.5–5.1)
Sodium: 139 mEq/L (ref 135–145)

## 2019-03-20 LAB — CBC WITH DIFFERENTIAL/PLATELET
Basophils Absolute: 0.1 10*3/uL (ref 0.0–0.1)
Basophils Relative: 1.3 % (ref 0.0–3.0)
Eosinophils Absolute: 0.1 10*3/uL (ref 0.0–0.7)
Eosinophils Relative: 1.4 % (ref 0.0–5.0)
HCT: 44.2 % (ref 39.0–52.0)
Hemoglobin: 14.9 g/dL (ref 13.0–17.0)
Lymphocytes Relative: 33.1 % (ref 12.0–46.0)
Lymphs Abs: 2.4 10*3/uL (ref 0.7–4.0)
MCHC: 33.8 g/dL (ref 30.0–36.0)
MCV: 97 fl (ref 78.0–100.0)
Monocytes Absolute: 0.5 10*3/uL (ref 0.1–1.0)
Monocytes Relative: 7.2 % (ref 3.0–12.0)
Neutro Abs: 4.1 10*3/uL (ref 1.4–7.7)
Neutrophils Relative %: 57 % (ref 43.0–77.0)
Platelets: 298 10*3/uL (ref 150.0–400.0)
RBC: 4.55 Mil/uL (ref 4.22–5.81)
RDW: 12.5 % (ref 11.5–15.5)
WBC: 7.1 10*3/uL (ref 4.0–10.5)

## 2019-03-20 LAB — MICROALBUMIN / CREATININE URINE RATIO
Creatinine,U: 146.9 mg/dL
Microalb Creat Ratio: 1.4 mg/g (ref 0.0–30.0)
Microalb, Ur: 2 mg/dL — ABNORMAL HIGH (ref 0.0–1.9)

## 2019-03-20 LAB — LDL CHOLESTEROL, DIRECT: Direct LDL: 161 mg/dL

## 2019-03-20 LAB — LIPID PANEL
Cholesterol: 224 mg/dL — ABNORMAL HIGH (ref 0–200)
HDL: 38.7 mg/dL — ABNORMAL LOW (ref >39.00)
NonHDL: 185.14
Total CHOL/HDL Ratio: 6
Triglycerides: 246 mg/dL — ABNORMAL HIGH (ref 0.0–149.0)
VLDL: 49.2 mg/dL — ABNORMAL HIGH (ref 0.0–40.0)

## 2019-03-20 LAB — HEMOGLOBIN A1C: Hgb A1c MFr Bld: 6.8 % — ABNORMAL HIGH (ref 4.6–6.5)

## 2019-03-20 LAB — PSA: PSA: 8.34 ng/mL — ABNORMAL HIGH (ref 0.10–4.00)

## 2019-03-21 ENCOUNTER — Telehealth: Payer: Self-pay | Admitting: Internal Medicine

## 2019-03-21 ENCOUNTER — Other Ambulatory Visit: Payer: Self-pay

## 2019-03-21 DIAGNOSIS — R972 Elevated prostate specific antigen [PSA]: Secondary | ICD-10-CM

## 2019-03-21 DIAGNOSIS — E785 Hyperlipidemia, unspecified: Secondary | ICD-10-CM

## 2019-03-21 DIAGNOSIS — E119 Type 2 diabetes mellitus without complications: Secondary | ICD-10-CM

## 2019-03-21 NOTE — Telephone Encounter (Signed)
Pt returning call to get results.  Copied from [place] [phone]. Topic: Quick Communication - Other Results (Clinic Use ONLY) >> Mar 21, 2019  4:24 PM Grayling [place], [place] wrote: Called patient to inform them of 03/20/2019 results. When patient returns call, triage nurse may disclose results.

## 2019-04-11 ENCOUNTER — Other Ambulatory Visit: Payer: Self-pay | Admitting: Internal Medicine

## 2019-05-11 ENCOUNTER — Other Ambulatory Visit: Payer: Self-pay | Admitting: Internal Medicine

## 2019-06-05 ENCOUNTER — Other Ambulatory Visit: Payer: Self-pay | Admitting: Internal Medicine

## 2019-06-14 ENCOUNTER — Other Ambulatory Visit: Payer: Self-pay | Admitting: Internal Medicine

## 2019-07-16 ENCOUNTER — Other Ambulatory Visit: Payer: Self-pay | Admitting: Internal Medicine

## 2019-08-16 ENCOUNTER — Other Ambulatory Visit: Payer: Self-pay | Admitting: Internal Medicine

## 2019-09-11 ENCOUNTER — Other Ambulatory Visit: Payer: Self-pay | Admitting: Internal Medicine

## 2019-09-16 ENCOUNTER — Other Ambulatory Visit: Payer: Self-pay | Admitting: Internal Medicine

## 2019-09-17 NOTE — Telephone Encounter (Signed)
Called patient and left a detailed voice message to let him know that he is due for his 6 month follow up and to call us back to schedule this appointment.

## 2019-10-15 ENCOUNTER — Other Ambulatory Visit: Payer: Self-pay | Admitting: Internal Medicine

## 2019-10-15 NOTE — Telephone Encounter (Signed)
Called patient and he is going to go to [facility] to have his labs done before I send in his refill. Patient verbalized an understanding.

## 2019-10-16 ENCOUNTER — Other Ambulatory Visit (INDEPENDENT_AMBULATORY_CARE_PROVIDER_SITE_OTHER): Payer: No Typology Code available for payment source

## 2019-10-16 DIAGNOSIS — R972 Elevated prostate specific antigen [PSA]: Secondary | ICD-10-CM

## 2019-10-16 DIAGNOSIS — E119 Type 2 diabetes mellitus without complications: Secondary | ICD-10-CM

## 2019-10-16 DIAGNOSIS — E785 Hyperlipidemia, unspecified: Secondary | ICD-10-CM

## 2019-10-16 LAB — LIPID PANEL
Cholesterol: 209 mg/dL — ABNORMAL HIGH (ref 0–200)
HDL: 35.9 mg/dL — ABNORMAL LOW (ref >39.00)
LDL Cholesterol: 151 mg/dL — ABNORMAL HIGH (ref 0–99)
NonHDL: 173.43
Total CHOL/HDL Ratio: 6
Triglycerides: 114 mg/dL (ref 0.0–149.0)
VLDL: 22.8 mg/dL (ref 0.0–40.0)

## 2019-10-16 NOTE — Telephone Encounter (Signed)
Patient is going to do labs first so he can get his refills.

## 2019-10-17 LAB — PSA, TOTAL AND FREE
PSA, % Free: 15 % (calc) — ABNORMAL LOW (ref >25)
PSA, Free: 1.2 ng/mL
PSA, Total: 8.2 ng/mL — ABNORMAL HIGH (ref <=4.0)

## 2019-10-17 LAB — HEMOGLOBIN A1C: Hgb A1c MFr Bld: 7.4 % — ABNORMAL HIGH (ref 4.6–6.5)

## 2019-11-05 ENCOUNTER — Other Ambulatory Visit: Payer: Self-pay | Admitting: *Deleted

## 2019-11-05 DIAGNOSIS — R972 Elevated prostate specific antigen [PSA]: Secondary | ICD-10-CM

## 2019-11-05 NOTE — Progress Notes (Signed)
A 1 c is up some 7.4   not as good as  the last one at 6.8  keep working on this   we can add  more medication if needed   Cholesterol not at goal  are you taking the Zetia ?  The psa is still elevated   at 8.2  I want you to see a urologist  for further evaluation   May we refer ?   Plan labs and cpx in the fall November sometime

## 2019-11-17 ENCOUNTER — Other Ambulatory Visit: Payer: Self-pay | Admitting: Internal Medicine

## 2019-11-22 ENCOUNTER — Other Ambulatory Visit: Payer: Self-pay | Admitting: Internal Medicine

## 2019-12-23 ENCOUNTER — Other Ambulatory Visit: Payer: Self-pay | Admitting: Internal Medicine

## 2020-01-21 ENCOUNTER — Other Ambulatory Visit: Payer: Self-pay | Admitting: Internal Medicine

## 2020-02-15 ENCOUNTER — Other Ambulatory Visit: Payer: Self-pay | Admitting: Internal Medicine

## 2020-02-24 ENCOUNTER — Other Ambulatory Visit: Payer: Self-pay | Admitting: Internal Medicine

## 2020-03-24 ENCOUNTER — Other Ambulatory Visit: Payer: Self-pay | Admitting: Internal Medicine

## 2020-04-23 ENCOUNTER — Other Ambulatory Visit: Payer: Self-pay | Admitting: Internal Medicine

## 2020-05-15 ENCOUNTER — Other Ambulatory Visit: Payer: Self-pay | Admitting: Internal Medicine

## 2020-06-05 ENCOUNTER — Other Ambulatory Visit: Payer: Self-pay | Admitting: Internal Medicine

## 2020-06-09 NOTE — Telephone Encounter (Signed)
Last office visit-  03/19/2019 Last labs--10/16/2019  Can he receive a refill?

## 2020-06-09 NOTE — Telephone Encounter (Signed)
Called lvm for patient to call office to schedule appointment for med refills

## 2020-06-09 NOTE — Telephone Encounter (Signed)
Have him make an appointment then can refill his medicine x1

## 2020-06-22 NOTE — Telephone Encounter (Signed)
Patient is scheduled for 06/29/20 for an appointment, wondering if he can get that refill for the Metformin.

## 2020-06-29 ENCOUNTER — Ambulatory Visit: Payer: No Typology Code available for payment source | Admitting: Internal Medicine

## 2020-06-29 NOTE — Progress Notes (Signed)
Chief Complaint  Patient presents with  . follow up to medication  . Hypertension  . Diabetes  . Medication Management    HPI: Bobby Obrien 60 y.o. come in for Chronic disease management  Delayed  BP .taking med not checking but has been ok   And in nroma  BG   Before last 3 mos    Averages 175 180  150   240 is eating sweets. Had dramatically changes eating habits over the years  Taking 2000 mg metformin per day   psat hx of good results son jardiance but not able to afford    Denies any lows  No eye check is aware needs one . No colon screen no fam hx  Have discussed this before  On no lipid meds  Hx of se atorva and some with crestor but willing to try low dose  ?  breathing ok  Except if dont eat on time. ?  wokring music performing at evenings  No sweet  Drinks  Water.   No major gu sx  ocass dec stream  ? Not aware of elevated psa from past ? ROS: See pertinent positives and negatives per HPI. Basically feels well  Is utd on covid vaccine  X 3  Past Medical History:  Diagnosis Date  . CONGESTIVE HEART FAILURE 12/04/2006   2005 Dec lvf echo 50-55% ef 2007  . DIABETES MELLITUS, TYPE II 12/04/2006  . History of DVT (deep vein thrombosis)    with PE 12 2005  . History of pulmonary embolism    73 05    with HF and hypertension  . HYPERLIPIDEMIA 12/04/2006  . HYPERTENSION 12/04/2006  . PANIC DISORDER 12/04/2006  . Personal history of venous thrombosis and embolism 12/04/2006    Family History  Problem Relation Age of Onset  . Kidney disease Mother   . Hypertension Mother   . Hypertension Father   . Heart attack Father     Social History   Socioeconomic History  . Marital status: Married    Spouse name: Not on file  . Number of children: Not on file  . Years of education: Not on file  . Highest education level: Not on file  Occupational History  . Not on file  Tobacco Use  . Smoking status: Never Smoker  . Smokeless tobacco: Never Used  Substance and Sexual  Activity  . Alcohol use: Yes  . Drug use: No  . Sexual activity: Not on file  Other Topics Concern  . 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          Social Determinants of Health   Financial Resource Strain: Not on file  Food Insecurity: Not on file  Transportation Needs: Not on file  Physical Activity: Not on file  Stress: Not on file  Social Connections: Not on file    Outpatient Medications Prior to Visit  Medication Sig Dispense Refill  . aspirin 81 MG tablet Take 81 mg by mouth daily.    Marland Kitchen ezetimibe (ZETIA) 10 MG tablet Take 1 tablet (10 mg total) by mouth daily. For lowering cholesterol 30 tablet 6  . lisinopril-hydrochlorothiazide (ZESTORETIC) 20-25 MG tablet TAKE ONE TABLET BY MOUTH DAILY 90 tablet 0  . metFORMIN (GLUCOPHAGE) 500 MG tablet TAKE FOUR TABLETS BY MOUTH EVERY MORNING WITH BREAKFAST **PLEASE SCHEDULE  AN APPOINTMENT FOR ADDITIONAL REFILLS** 120 tablet 0   No facility-administered medications prior to visit.     EXAM:  BP 118/76   Pulse 82   Temp 99.2 F (37.3 C)   Resp 16   Wt 205 lb (93 kg)   SpO2 98%   BMI 27.80 kg/m   Body mass index is 27.8 kg/m.  GENERAL: vitals reviewed and listed above, alert, oriented, appears well hydrated and in no acute distress HEENT: atraumatic, conjunctiva  clear, no obvious abnormalities on inspection of external nose and ears OP : masked  NECK: no obvious masses on inspection palpation  LUNGS: clear to auscultation bilaterally, no wheezes, rales or rhonchi, good air movement CV: HRRR, no clubbing cyanosis or  peripheral edema nl cap refill  Abdomen:  Sof,t normal bowel sounds without hepatosplenomegaly, no guarding rebound or masses no CVA tenderness MS: moves all extremities without noticeable focal  abnormality PSYCH: pleasant and cooperative, no obvious depression or anxiety Diabetic Foot Exam - Simple   Simple Foot  Form Diabetic Foot exam was performed with the following findings: Yes 06/30/2020 12:44 PM  Visual Inspection No deformities, no ulcerations, no other skin breakdown bilaterally: Yes Sensation Testing Intact to touch and monofilament testing bilaterally: Yes Pulse Check Posterior Tibialis and Dorsalis pulse intact bilaterally: Yes Comments     Lab Results  Component Value Date   WBC 7.1 03/19/2019   HGB 14.9 03/19/2019   HCT 44.2 03/19/2019   PLT 298.0 03/19/2019   GLUCOSE 176 (H) 03/19/2019   CHOL 209 (H) 10/16/2019   TRIG 114.0 10/16/2019   HDL 35.90 (L) 10/16/2019   LDLDIRECT 161.0 03/19/2019   LDLCALC 151 (H) 10/16/2019   ALT 23 03/19/2019   AST 19 03/19/2019   NA 139 03/19/2019   K 4.3 03/19/2019   CL 100 03/19/2019   CREATININE 1.13 03/19/2019   BUN 13 03/19/2019   CO2 29 03/19/2019   TSH 1.37 05/26/2014   PSA 8.34 (H) 03/19/2019   HGBA1C 7.4 (H) 10/16/2019   MICROALBUR 2.0 (H) 03/19/2019   BP Readings from Last 3 Encounters:  06/30/20 118/76  03/19/19 122/62  03/19/18 122/70    ASSESSMENT AND PLAN:  Discussed the following assessment and plan:  Diabetes mellitus due to underlying condition with hyperglycemia, without long-term current use of insulin (HCC) - Plan: Basic metabolic panel, CBC with Differential/Platelet, Hemoglobin A1c, Hepatic function panel, Lipid panel, PSA, Microalbumin / creatinine urine ratio  Hyperlipidemia, unspecified hyperlipidemia type - Plan: Basic metabolic panel, CBC with Differential/Platelet, Hemoglobin A1c, Hepatic function panel, Lipid panel, PSA, Microalbumin / creatinine urine ratio  Essential hypertension - Plan: Basic metabolic panel, CBC with Differential/Platelet, Hemoglobin A1c, Hepatic function panel, Lipid panel, PSA, Microalbumin / creatinine urine ratio  Statin intolerance - Plan: Basic metabolic panel, CBC with Differential/Platelet, Hemoglobin A1c, Hepatic function panel, Lipid panel, PSA, Microalbumin / creatinine  urine ratio  Screening PSA (prostate specific antigen) - Plan: Basic metabolic panel, CBC with Differential/Platelet, Hemoglobin A1c, Hepatic function panel, Lipid panel, PSA, Microalbumin / creatinine urine ratio  Colon cancer screening - Plan: Fecal occult blood, imunochemical Overdue  For l;ab and fu monitoring  Patient  Asks to do lab fasting later  Soon   Order splaced  Delayed  And not at goal for lipids and psa was elevated in past   Needs fu colon screen .  -Patient advised to return or notify health care team  if  new concerns arise.  Patient Instructions   Get  Your eye  check  Need colon cancer screening  Check on cologuard   As a screening for colon cancer   Otherwise colonoscopy or  FIT stool cards every year.  Can get the card kits to screen from the lab.  There are many other meds can be added for  Diabetes if needed but at cost .  farxiga or jardiance   Checking  PSA as was elevated in the past.   Still advise  A cholesterol medication that you can tolerate .     Diabetes Mellitus and Standards of [place] with and managing diabetes (diabetes mellitus) can be complicated. Your diabetes treatment may be managed by a team of health care providers, including:  A physician who specializes in diabetes (endocrinologist). You might also have visits with a nurse practitioner or physician assistant.  Nurses.  A registered dietitian.  A certified diabetes care and education specialist.  An exercise specialist.  A pharmacist.  An eye doctor.  A foot specialist (podiatrist).  A dental care provider.  A primary care provider.  A mental health care provider. How to manage your diabetes You can do many things to successfully manage your diabetes. Your health care providers will follow guidelines to help you get the best quality of care. Here are general guidelines for your diabetes management plan. Your health care providers may give you more specific  instructions. Physical exams When you are diagnosed with diabetes, and each year after that, your health care provider will ask about your medical and family history. You will have a physical exam, which may include:  Measuring your height, weight, and body mass index (BMI).  Checking your blood pressure. This will be done at every routine medical visit. Your target blood pressure may vary depending on your medical conditions, your age, and other factors.  A thyroid exam.  A skin exam.  Screening for nerve damage (peripheral neuropathy). This may include checking the pulse in your legs and feet and the level of sensation in your hands and feet.  A foot exam to inspect the structure and skin of your feet, including checking for cuts, bruises, redness, blisters, sores, or other problems.  Screening for blood vessel (vascular) problems. This may include checking the pulse in your legs and feet and checking your temperature. Blood tests Depending on your treatment plan and your personal needs, you may have the following tests:  Hemoglobin A1C (HbA1C). This test provides information about blood sugar (glucose) control over the previous 2-3 months. It is used to adjust your treatment plan, if needed. This test will be done: ? At least 2 times a year, if you are meeting your treatment goals. ? 4 times a year, if you are not meeting your treatment goals or if your goals have changed.  Lipid testing, including total cholesterol, LDL and HDL cholesterol, and triglyceride levels. ? The goal for LDL is less than 100 mg/dL (5.5 mmol/L). If you are at high risk for complications, the goal is less than 70 mg/dL (3.9 mmol/L). ? The goal for HDL is 40 mg/dL (2.2 mmol/L) or higher for men, and 50 mg/dL (2.8 mmol/L) or higher for women. An HDL cholesterol of 60 mg/dL (3.3 mmol/L) or higher gives some protection against heart disease. ? The goal for triglycerides is less than 150 mg/dL (8.3 mmol/L).  Liver  function tests.  Kidney function tests.  Thyroid function tests.   Dental and eye exams  Visit your dentist two times a year.  If you  have type 1 diabetes, your health care provider may recommend an eye exam within 5 years after you are diagnosed, and then once a year after your first exam. ? For children with type 1 diabetes, the health care provider may recommend an eye exam when your child is age 60 or older and has had diabetes for 3-5 years. After the first exam, your child should get an eye exam once a year.  If you have type 2 diabetes, your health care provider may recommend an eye exam as soon as you are diagnosed, and then every 1-2 years after your first exam.   Immunizations  A yearly flu (influenza) vaccine is recommended annually for everyone 6 months or older. This is especially important if you have diabetes.  The pneumonia (pneumococcal) vaccine is recommended for everyone 2 years or older who has diabetes. If you are age 73 or older, you may get the pneumonia vaccine as a series of two separate shots.  The hepatitis B vaccine is recommended for adults shortly after being diagnosed with diabetes. Adults and children with diabetes should receive all other vaccines according to age-specific recommendations from the Centers for Disease Control and Prevention (CDC). Mental and emotional health Screening for symptoms of eating disorders, anxiety, and depression is recommended at the time of diagnosis and after as needed. If your screening shows that you have symptoms, you may need more evaluation. You may work with a mental health care provider. Follow these instructions at home: Treatment plan You will monitor your blood glucose levels and may give yourself insulin. Your treatment plan will be reviewed at every medical visit. You and your health care provider will discuss:  How you are taking your medicines, including insulin.  Any side effects you have.  Your blood  glucose level target goals.  How often you monitor your blood glucose level.  Lifestyle habits, such as activity level and tobacco, alcohol, and substance use. Education Your health care provider will assess how well you are monitoring your blood glucose levels and whether you are taking your insulin and medicines correctly. He or she may refer you to:  A certified diabetes care and education specialist to manage your diabetes throughout your life, starting at diagnosis.  A registered dietitian who can create and review your personal nutrition plan.  An exercise specialist who can discuss your activity level and exercise plan. General instructions  Take over-the-counter and prescription medicines only as told by your health care provider.  Keep all follow-up visits. This is important. Where to find support There are many diabetes support networks, including:  American Diabetes Association (ADA): diabetes.org  Defeat Diabetes Foundation: defeatdiabetes.org Where to find more information  American Diabetes Association (ADA): www.diabetes.org  Association of Diabetes Care & Education Specialists (ADCES): diabeteseducator.org  International Diabetes Federation (IDF): https://www.munoz-bell.org/ Summary  Managing diabetes (diabetes mellitus) can be complicated. Your diabetes treatment may be managed by a team of health care providers.  Your health care providers follow guidelines to help you get the best quality care.  You should have physical exams, blood tests, blood pressure monitoring, immunizations, and screening tests regularly. Stay updated on how to manage your diabetes.  Your health care providers may also give you more specific instructions based on your individual health. This information is not intended to replace advice given to you by your health care provider. Make sure you discuss any questions you have with your health care provider. Document Revised: 10/30/2019 Document Reviewed:  10/30/2019 Elsevier Patient Education  [place] Escher Harr M.D.

## 2020-06-30 ENCOUNTER — Other Ambulatory Visit: Payer: Self-pay

## 2020-06-30 ENCOUNTER — Encounter: Payer: Self-pay | Admitting: Internal Medicine

## 2020-06-30 ENCOUNTER — Ambulatory Visit (INDEPENDENT_AMBULATORY_CARE_PROVIDER_SITE_OTHER): Payer: No Typology Code available for payment source | Admitting: Internal Medicine

## 2020-06-30 VITALS — BP 118/76 | HR 82 | Temp 99.2°F | Resp 16 | Wt 205.0 lb

## 2020-06-30 DIAGNOSIS — Z789 Other specified health status: Secondary | ICD-10-CM

## 2020-06-30 DIAGNOSIS — E119 Type 2 diabetes mellitus without complications: Secondary | ICD-10-CM

## 2020-06-30 DIAGNOSIS — Z125 Encounter for screening for malignant neoplasm of prostate: Secondary | ICD-10-CM

## 2020-06-30 DIAGNOSIS — I10 Essential (primary) hypertension: Secondary | ICD-10-CM

## 2020-06-30 DIAGNOSIS — E785 Hyperlipidemia, unspecified: Secondary | ICD-10-CM

## 2020-06-30 DIAGNOSIS — Z1211 Encounter for screening for malignant neoplasm of colon: Secondary | ICD-10-CM

## 2020-06-30 DIAGNOSIS — E0865 Diabetes mellitus due to underlying condition with hyperglycemia: Secondary | ICD-10-CM

## 2020-06-30 NOTE — Patient Instructions (Addendum)
Get  Your eye check  Need colon cancer screening  Check on cologuard   As a screening for colon cancer   Otherwise colonoscopy or  FIT stool cards every year.  Can get the card kits to screen from the lab.  There are many other meds can be added for  Diabetes if needed but at cost .  farxiga or jardiance   Checking  PSA as was elevated in the past.   Still advise  A cholesterol medication that you can tolerate .     Diabetes Mellitus and Standards of [place] with and managing diabetes (diabetes mellitus) can be complicated. Your diabetes treatment may be managed by a team of health care providers, including:  A physician who specializes in diabetes (endocrinologist). You might also have visits with a nurse practitioner or physician assistant.  Nurses.  A registered dietitian.  A certified diabetes care and education specialist.  An exercise specialist.  A pharmacist.  An eye doctor.  A foot specialist (podiatrist).  A dental care provider.  A primary care provider.  A mental health care provider. How to manage your diabetes You can do many things to successfully manage your diabetes. Your health care providers will follow guidelines to help you get the best quality of care. Here are general guidelines for your diabetes management plan. Your health care providers may give you more specific instructions. Physical exams When you are diagnosed with diabetes, and each year after that, your health care provider will ask about your medical and family history. You will have a physical exam, which may include:  Measuring your height, weight, and body mass index (BMI).  Checking your blood pressure. This will be done at every routine medical visit. Your target blood pressure may vary depending on your medical conditions, your age, and other factors.  A thyroid exam.  A skin exam.  Screening for nerve damage (peripheral neuropathy). This may include checking the  pulse in your legs and feet and the level of sensation in your hands and feet.  A foot exam to inspect the structure and skin of your feet, including checking for cuts, bruises, redness, blisters, sores, or other problems.  Screening for blood vessel (vascular) problems. This may include checking the pulse in your legs and feet and checking your temperature. Blood tests Depending on your treatment plan and your personal needs, you may have the following tests:  Hemoglobin A1C (HbA1C). This test provides information about blood sugar (glucose) control over the previous 2-3 months. It is used to adjust your treatment plan, if needed. This test will be done: ? At least 2 times a year, if you are meeting your treatment goals. ? 4 times a year, if you are not meeting your treatment goals or if your goals have changed.  Lipid testing, including total cholesterol, LDL and HDL cholesterol, and triglyceride levels. ? The goal for LDL is less than 100 mg/dL (5.5 mmol/L). If you are at high risk for complications, the goal is less than 70 mg/dL (3.9 mmol/L). ? The goal for HDL is 40 mg/dL (2.2 mmol/L) or higher for men, and 50 mg/dL (2.8 mmol/L) or higher for women. An HDL cholesterol of 60 mg/dL (3.3 mmol/L) or higher gives some protection against heart disease. ? The goal for triglycerides is less than 150 mg/dL (8.3 mmol/L).  Liver function tests.  Kidney function tests.  Thyroid function tests.   Dental and eye exams  Visit your dentist two times a year.  If you have type 1 diabetes, your health care provider may recommend an eye exam within 5 years after you are diagnosed, and then once a year after your first exam. ? For children with type 1 diabetes, the health care provider may recommend an eye exam when your child is age 33 or older and has had diabetes for 3-5 years. After the first exam, your child should get an eye exam once a year.  If you have type 2 diabetes, your health care  provider may recommend an eye exam as soon as you are diagnosed, and then every 1-2 years after your first exam.   Immunizations  A yearly flu (influenza) vaccine is recommended annually for everyone 6 months or older. This is especially important if you have diabetes.  The pneumonia (pneumococcal) vaccine is recommended for everyone 2 years or older who has diabetes. If you are age 4 or older, you may get the pneumonia vaccine as a series of two separate shots.  The hepatitis B vaccine is recommended for adults shortly after being diagnosed with diabetes. Adults and children with diabetes should receive all other vaccines according to age-specific recommendations from the Centers for Disease Control and Prevention (CDC). Mental and emotional health Screening for symptoms of eating disorders, anxiety, and depression is recommended at the time of diagnosis and after as needed. If your screening shows that you have symptoms, you may need more evaluation. You may work with a mental health care provider. Follow these instructions at home: Treatment plan You will monitor your blood glucose levels and may give yourself insulin. Your treatment plan will be reviewed at every medical visit. You and your health care provider will discuss:  How you are taking your medicines, including insulin.  Any side effects you have.  Your blood glucose level target goals.  How often you monitor your blood glucose level.  Lifestyle habits, such as activity level and tobacco, alcohol, and substance use. Education Your health care provider will assess how well you are monitoring your blood glucose levels and whether you are taking your insulin and medicines correctly. He or she may refer you to:  A certified diabetes care and education specialist to manage your diabetes throughout your life, starting at diagnosis.  A registered dietitian who can create and review your personal nutrition plan.  An exercise  specialist who can discuss your activity level and exercise plan. General instructions  Take over-the-counter and prescription medicines only as told by your health care provider.  Keep all follow-up visits. This is important. Where to find support There are many diabetes support networks, including:  American Diabetes Association (ADA): diabetes.org  Defeat Diabetes Foundation: defeatdiabetes.org Where to find more information  American Diabetes Association (ADA): www.diabetes.org  Association of Diabetes Care & Education Specialists (ADCES): diabeteseducator.org  International Diabetes Federation (IDF): https://www.munoz-bell.org/ Summary  Managing diabetes (diabetes mellitus) can be complicated. Your diabetes treatment may be managed by a team of health care providers.  Your health care providers follow guidelines to help you get the best quality care.  You should have physical exams, blood tests, blood pressure monitoring, immunizations, and screening tests regularly. Stay updated on how to manage your diabetes.  Your health care providers may also give you more specific instructions based on your individual health. This information is not intended to replace advice given to you by your health care provider. Make sure you discuss any questions you have with your health care provider. Document Revised: 10/30/2019 Document Reviewed: 10/30/2019 Elsevier  Patient Education  2021 [employer].

## 2020-07-06 ENCOUNTER — Other Ambulatory Visit: Payer: Self-pay

## 2020-07-06 ENCOUNTER — Other Ambulatory Visit (INDEPENDENT_AMBULATORY_CARE_PROVIDER_SITE_OTHER): Payer: No Typology Code available for payment source

## 2020-07-06 DIAGNOSIS — R972 Elevated prostate specific antigen [PSA]: Secondary | ICD-10-CM

## 2020-07-06 DIAGNOSIS — I10 Essential (primary) hypertension: Secondary | ICD-10-CM

## 2020-07-06 DIAGNOSIS — E0865 Diabetes mellitus due to underlying condition with hyperglycemia: Secondary | ICD-10-CM

## 2020-07-06 DIAGNOSIS — Z125 Encounter for screening for malignant neoplasm of prostate: Secondary | ICD-10-CM

## 2020-07-06 DIAGNOSIS — Z789 Other specified health status: Secondary | ICD-10-CM

## 2020-07-06 DIAGNOSIS — E785 Hyperlipidemia, unspecified: Secondary | ICD-10-CM

## 2020-07-06 LAB — CBC WITH DIFFERENTIAL/PLATELET
Basophils Absolute: 0.1 10*3/uL (ref 0.0–0.1)
Basophils Relative: 1 % (ref 0.0–3.0)
Eosinophils Absolute: 0.1 10*3/uL (ref 0.0–0.7)
Eosinophils Relative: 1.2 % (ref 0.0–5.0)
HCT: 40.7 % (ref 39.0–52.0)
Hemoglobin: 14 g/dL (ref 13.0–17.0)
Lymphocytes Relative: 33.7 % (ref 12.0–46.0)
Lymphs Abs: 2.2 10*3/uL (ref 0.7–4.0)
MCHC: 34.3 g/dL (ref 30.0–36.0)
MCV: 93.1 fl (ref 78.0–100.0)
Monocytes Absolute: 0.6 10*3/uL (ref 0.1–1.0)
Monocytes Relative: 9.2 % (ref 3.0–12.0)
Neutro Abs: 3.6 10*3/uL (ref 1.4–7.7)
Neutrophils Relative %: 54.9 % (ref 43.0–77.0)
Platelets: 297 10*3/uL (ref 150.0–400.0)
RBC: 4.37 Mil/uL (ref 4.22–5.81)
RDW: 12.8 % (ref 11.5–15.5)
WBC: 6.6 10*3/uL (ref 4.0–10.5)

## 2020-07-06 LAB — PSA: PSA: 8.19 ng/mL — ABNORMAL HIGH (ref 0.10–4.00)

## 2020-07-06 LAB — LIPID PANEL
Cholesterol: 184 mg/dL (ref 0–200)
HDL: 38.9 mg/dL — ABNORMAL LOW (ref >39.00)
LDL Cholesterol: 130 mg/dL — ABNORMAL HIGH (ref 0–99)
NonHDL: 145.51
Total CHOL/HDL Ratio: 5
Triglycerides: 76 mg/dL (ref 0.0–149.0)
VLDL: 15.2 mg/dL (ref 0.0–40.0)

## 2020-07-06 LAB — BASIC METABOLIC PANEL
BUN: 16 mg/dL (ref 6–23)
CO2: 29 mEq/L (ref 19–32)
Calcium: 9.2 mg/dL (ref 8.4–10.5)
Chloride: 98 mEq/L (ref 96–112)
Creatinine, Ser: 1.04 mg/dL (ref 0.40–1.50)
GFR: 77.79 mL/min (ref >60.00)
Glucose, Bld: 171 mg/dL — ABNORMAL HIGH (ref 70–99)
Potassium: 4.1 mEq/L (ref 3.5–5.1)
Sodium: 135 mEq/L (ref 135–145)

## 2020-07-06 LAB — HEPATIC FUNCTION PANEL
ALT: 16 U/L (ref 0–53)
AST: 14 U/L (ref 0–37)
Albumin: 4.3 g/dL (ref 3.5–5.2)
Alkaline Phosphatase: 76 U/L (ref 39–117)
Bilirubin, Direct: 0.1 mg/dL (ref 0.0–0.3)
Total Bilirubin: 0.5 mg/dL (ref 0.2–1.2)
Total Protein: 7 g/dL (ref 6.0–8.3)

## 2020-07-06 LAB — HEMOGLOBIN A1C: Hgb A1c MFr Bld: 7.7 % — ABNORMAL HIGH (ref 4.6–6.5)

## 2020-07-06 NOTE — Addendum Note (Signed)
Addended by: Tessie Fass D on: 07/06/2020 04:35 PM   Modules accepted: Orders

## 2020-07-07 LAB — MICROALBUMIN / CREATININE URINE RATIO
Creatinine,U: 120.2 mg/dL
Microalb Creat Ratio: 3 mg/g (ref 0.0–30.0)
Microalb, Ur: 3.6 mg/dL — ABNORMAL HIGH (ref 0.0–1.9)

## 2020-07-18 ENCOUNTER — Other Ambulatory Visit: Payer: Self-pay | Admitting: Internal Medicine

## 2020-08-19 NOTE — Progress Notes (Signed)
Psa is still elevated  I want him  to see urology . Early cancer can cause this but not always the problem . Want to make sure   Please  do referral to urology and tell patient   Hg a1c is  7.7  creeping up  . Intensify lifestyle interventions.  We can try jardiance or farxiga again   Can check out this web site  to see if you qualify for  50 $  per month  copay for farxiga ( similar to jardiance )  [email or web address].[email or web address]  Maddie can you help  and see if he can get jardiance or  farxiga at a reasonable price?   And then I can write rx   Cholesterol needs to be better  at next visit we can discuss other possiblities  for cholesterol lowering  since you had side effects of  some of the other medications.  Plan ROV to check a1c in 4-6 months either way .

## 2020-08-25 ENCOUNTER — Telehealth: Payer: Self-pay | Admitting: Internal Medicine

## 2020-08-25 NOTE — Addendum Note (Signed)
Addended by: Nilda Riggs on: 08/25/2020 10:36 AM   Modules accepted: Orders

## 2020-08-25 NOTE — Telephone Encounter (Signed)
Patient returned Mykal's call

## 2020-08-25 NOTE — Telephone Encounter (Signed)
I spoke with the patient and informed him of his results.

## 2020-08-31 ENCOUNTER — Telehealth: Payer: Self-pay | Admitting: Pharmacist

## 2020-08-31 NOTE — Telephone Encounter (Signed)
Called patient to inquire about insurance coverage for SGLT2 inhibitor per PCP request. Patient is unsure if his insurance covers medications and does not currently receive assistance for that.

## 2020-09-13 ENCOUNTER — Telehealth: Payer: Self-pay | Admitting: Internal Medicine

## 2020-09-13 DIAGNOSIS — R972 Elevated prostate specific antigen [PSA]: Secondary | ICD-10-CM

## 2020-09-13 NOTE — Telephone Encounter (Signed)
Patient is calling and wanted to see if provider can refer him to see a Neurologist for his PSA numbers being elevated. Pt is requesting to Dr. Tonia Brooms on [facility], please advise. CB is [phone]

## 2020-09-13 NOTE — Telephone Encounter (Signed)
Called patient and left a voicemail to return the phonecall.   Spoke with [facility] and they only offer a discount through the health department for medications and do not cover prescriptions.   Will attempt to reach the patient again to determine patient assistance eligibility for [place] or Jardiance.

## 2020-09-14 NOTE — Telephone Encounter (Signed)
Urology referral has been placed. 

## 2020-09-14 NOTE — Addendum Note (Signed)
Addended by: Rosalee Kaufman L on: 09/14/2020 11:03 AM   Modules accepted: Orders

## 2020-09-14 NOTE — Telephone Encounter (Signed)
Yes this is for a urologist not neurologist Please refer to Dr. Karsten Ro as per patient request Urology referral may have already been but placed but adjust for patient request

## 2020-09-16 NOTE — Telephone Encounter (Signed)
Called patient and provided information about [facility] and lack of pharmacy coverage. Plan to send over a prescription for Farxiga to the pharmacy and will call in a 30 day coupon card to cover it.   Will send patient assistance application in the mail to provide coverage after he uses the 30 day coupon card.  Discussed side effects with SGLT2 therapy and recommended for patient to drink plenty of water while taking the medication.

## 2020-10-28 ENCOUNTER — Other Ambulatory Visit: Payer: Self-pay | Admitting: Internal Medicine

## 2020-11-15 ENCOUNTER — Ambulatory Visit: Payer: No Typology Code available for payment source | Admitting: Internal Medicine

## 2020-11-18 ENCOUNTER — Telehealth (INDEPENDENT_AMBULATORY_CARE_PROVIDER_SITE_OTHER): Payer: No Typology Code available for payment source | Admitting: Internal Medicine

## 2020-11-18 ENCOUNTER — Other Ambulatory Visit: Payer: Self-pay

## 2020-11-18 DIAGNOSIS — I959 Hypotension, unspecified: Secondary | ICD-10-CM

## 2020-11-18 DIAGNOSIS — E0865 Diabetes mellitus due to underlying condition with hyperglycemia: Secondary | ICD-10-CM

## 2020-11-18 DIAGNOSIS — I10 Essential (primary) hypertension: Secondary | ICD-10-CM

## 2020-11-18 NOTE — Progress Notes (Signed)
Virtual Visit via Video Note  I connected withNAME@ on 11/18/20 at 10:30 AM EDT by a video enabled telemedicine application and verified that I am speaking with the correct person using two identifiers. Location patient: home Location provider:home office Persons participating in the virtual visit: patient, provider  [place] national recommendations  regarding COVID 19 pandemic   video visit is advised over in office visit for this patient.  Patient aware  of the limitations of evaluation and management by telemedicine and  availability of in person appointments. and agreed to proceed.   HPI: Bobby Obrien presents for video visit Last week  awoke 3-4 am with sweating and feeling very badly   bg was 174  but bp was 96/67   intake some salty  and bp up to 112 range  pulse was never high .  Takes bp med in am .(lis hctz)   Did not take med the next day  and then 1/2 and back  bp reading reported  140/80s to 124/74   today 151/93 and 142/86  pulse 79   Feels good and normal now  never had dcp sob    neuro sx.  Ate large mount of cantaloupe the evening before and thinks may be related .   Weight stable around 203  ROS: See pertinent positives and negatives per HPI. Has urology appt end of month about elevated psa  July 27  Past Medical History:  Diagnosis Date   CONGESTIVE HEART FAILURE 12/04/2006   2005 Dec lvf echo 50-55% ef 2007   DIABETES MELLITUS, TYPE II 12/04/2006   History of DVT (deep vein thrombosis)    with PE 12 2005   History of pulmonary embolism    24 05    with HF and hypertension   HYPERLIPIDEMIA 12/04/2006   HYPERTENSION 12/04/2006   PANIC DISORDER 12/04/2006   Personal history of venous thrombosis and embolism 12/04/2006    No past surgical history on file.  Family History  Problem Relation Age of Onset   Kidney disease Mother    Hypertension Mother    Hypertension Father    Heart attack Father     Social History   Tobacco Use   Smoking status: Never    Smokeless tobacco: Never  Substance Use Topics   Alcohol use: Yes   Drug use: No      Current Outpatient Medications:    aspirin 81 MG tablet, Take 81 mg by mouth daily., Disp: , Rfl:    ezetimibe (ZETIA) 10 MG tablet, Take 1 tablet (10 mg total) by mouth daily. For lowering cholesterol, Disp: 30 tablet, Rfl: 6   lisinopril-hydrochlorothiazide (ZESTORETIC) 20-25 MG tablet, TAKE ONE TABLET BY MOUTH DAILY, Disp: 90 tablet, Rfl: 0   metFORMIN (GLUCOPHAGE) 500 MG tablet, TAKE FOUR TABLETS BY MOUTH EVERY MORNING WITH BREAKFAST, Disp: 120 tablet, Rfl: 2  EXAM: BP Readings from Last 3 Encounters:  06/30/20 118/76  03/19/19 122/62  03/19/18 122/70    VITALS per patient if applicable:  GENERAL: alert, oriented, appears well and in no acute distress  HEENT: atraumatic, conjunttiva clear, no obvious abnormalities on inspection of external nose and ears  NECK: normal movements of the head and neck  LUNGS: on inspection no signs of respiratory distress, breathing rate appears normal, no obvious gross SOB, gasping or wheezing  CV: no obvious cyanosis  MS: moves all visible extremities without noticeable abnormality  PSYCH/NEURO: pleasant and cooperative, no obvious depression or anxiety, speech and thought processing grossly intact  Lab Results  Component Value Date   WBC 6.6 07/06/2020   HGB 14.0 07/06/2020   HCT 40.7 07/06/2020   PLT 297.0 07/06/2020   GLUCOSE 171 (H) 07/06/2020   CHOL 184 07/06/2020   TRIG 76.0 07/06/2020   HDL 38.90 (L) 07/06/2020   LDLDIRECT 161.0 03/19/2019   LDLCALC 130 (H) 07/06/2020   ALT 16 07/06/2020   AST 14 07/06/2020   NA 135 07/06/2020   K 4.1 07/06/2020   CL 98 07/06/2020   CREATININE 1.04 07/06/2020   BUN 16 07/06/2020   CO2 29 07/06/2020   TSH 1.37 05/26/2014   PSA 8.19 (H) 07/06/2020   HGBA1C 7.7 (H) 07/06/2020   MICROALBUR 3.6 (H) 07/06/2020    ASSESSMENT AND PLAN:  Discussed the following assessment and plan:    ICD-10-CM   1.  Hypotension, unspecified hypotension type  I95.9    night episode sounds like hypoglycemia but  bg not low but bp low  cufrrently fine    2. Diabetes mellitus due to underlying condition with hyperglycemia, without long-term current use of insulin (HCC)  E08.65     3. Essential hypertension  I10       Reported episode sounds like hypoglycemia but blood sugar was 170 and fine and blood pressure was low without obvious other sx as to cause .  He feels fine now.   Advised continued monitoring if going low take 1/2 half dose of medication.  Plan follow-up in about a month. To regroup and decide on  plan will need  fy labs also  He has urology appointment end of the month.regarding elevated PSA Will readdress option to go on Farxiga with coupon to his Kristopher Oppenheim.  Counseled.   Expectant management and discussion of plan and treatment with opportunity to ask questions and all were answered. The patient agreed with the plan and demonstrated an understanding of the instructions.   Advised to call back or seek an in-person evaluation if worsening  or having  further concerns . Return in about 1 month (around 12/19/2020) for in person or virtual reagarding  bp meds  bg etc .   Shanon Ace, MD

## 2020-12-02 ENCOUNTER — Telehealth: Payer: Self-pay | Admitting: Pharmacist

## 2020-12-02 ENCOUNTER — Other Ambulatory Visit: Payer: Self-pay | Admitting: Internal Medicine

## 2020-12-02 MED ORDER — DAPAGLIFLOZIN PROPANEDIOL 5 MG PO TABS: 5.0000 mg | ORAL_TABLET | Freq: Every day | ORAL | 3 refills | Status: DC

## 2020-12-02 NOTE — Telephone Encounter (Signed)
Called patient to make him aware that a prescription was sent in for Sylva by Dr. Regis Bill today and to pick it up at the pharmacy. Patient did not answer and left a detailed voicemail. Requested a call back about the patient assistance application. Will be mailing this out as soon as possible.  Called in a 30 day coupon card for a free trial of the medication and pharmacy confirmed it was a $0 copay.

## 2020-12-26 ENCOUNTER — Emergency Department (HOSPITAL_BASED_OUTPATIENT_CLINIC_OR_DEPARTMENT_OTHER)
Admission: EM | Admit: 2020-12-26 | Discharge: 2020-12-26 | Disposition: A | Discharge status: Home / Self Care | Payer: No Typology Code available for payment source | Attending: Emergency Medicine | Admitting: Emergency Medicine

## 2020-12-26 ENCOUNTER — Emergency Department (HOSPITAL_BASED_OUTPATIENT_CLINIC_OR_DEPARTMENT_OTHER): Payer: No Typology Code available for payment source

## 2020-12-26 ENCOUNTER — Other Ambulatory Visit: Payer: Self-pay

## 2020-12-26 DIAGNOSIS — Z7984 Long term (current) use of oral hypoglycemic drugs: Secondary | ICD-10-CM | POA: Insufficient documentation

## 2020-12-26 DIAGNOSIS — Z20822 Contact with and (suspected) exposure to covid-19: Secondary | ICD-10-CM | POA: Insufficient documentation

## 2020-12-26 DIAGNOSIS — I471 Supraventricular tachycardia: Secondary | ICD-10-CM | POA: Insufficient documentation

## 2020-12-26 DIAGNOSIS — E119 Type 2 diabetes mellitus without complications: Secondary | ICD-10-CM | POA: Insufficient documentation

## 2020-12-26 DIAGNOSIS — Z7982 Long term (current) use of aspirin: Secondary | ICD-10-CM | POA: Insufficient documentation

## 2020-12-26 DIAGNOSIS — Z79899 Other long term (current) drug therapy: Secondary | ICD-10-CM | POA: Insufficient documentation

## 2020-12-26 DIAGNOSIS — I509 Heart failure, unspecified: Secondary | ICD-10-CM | POA: Insufficient documentation

## 2020-12-26 DIAGNOSIS — I110 Hypertensive heart disease with heart failure: Secondary | ICD-10-CM | POA: Insufficient documentation

## 2020-12-26 LAB — CBC WITH DIFFERENTIAL/PLATELET
Abs Immature Granulocytes: 0.05 10*3/uL (ref 0.00–0.07)
Basophils Absolute: 0.1 10*3/uL (ref 0.0–0.1)
Basophils Relative: 1 %
Eosinophils Absolute: 0 10*3/uL (ref 0.0–0.5)
Eosinophils Relative: 0 %
HCT: 44.8 % (ref 39.0–52.0)
Hemoglobin: 15.1 g/dL (ref 13.0–17.0)
Immature Granulocytes: 0 %
Lymphocytes Relative: 32 %
Lymphs Abs: 3.9 10*3/uL (ref 0.7–4.0)
MCH: 31.9 pg (ref 26.0–34.0)
MCHC: 33.7 g/dL (ref 30.0–36.0)
MCV: 94.7 fL (ref 80.0–100.0)
Monocytes Absolute: 0.8 10*3/uL (ref 0.1–1.0)
Monocytes Relative: 6 %
Neutro Abs: 7.5 10*3/uL (ref 1.7–7.7)
Neutrophils Relative %: 61 %
Platelets: 350 10*3/uL (ref 150–400)
RBC: 4.73 MIL/uL (ref 4.22–5.81)
RDW: 12.1 % (ref 11.5–15.5)
WBC: 12.3 10*3/uL — ABNORMAL HIGH (ref 4.0–10.5)
nRBC: 0 % (ref 0.0–0.2)

## 2020-12-26 LAB — BASIC METABOLIC PANEL
Anion gap: 17 — ABNORMAL HIGH (ref 5–15)
Anion gap: 10 (ref 5–15)
BUN: 42 mg/dL — ABNORMAL HIGH (ref 8–23)
BUN: 37 mg/dL — ABNORMAL HIGH (ref 8–23)
CO2: 20 mmol/L — ABNORMAL LOW (ref 22–32)
CO2: 19 mmol/L — ABNORMAL LOW (ref 22–32)
Calcium: 9.7 mg/dL (ref 8.9–10.3)
Calcium: 7.8 mg/dL — ABNORMAL LOW (ref 8.9–10.3)
Chloride: 99 mmol/L (ref 98–111)
Chloride: 108 mmol/L (ref 98–111)
Creatinine, Ser: 1.75 mg/dL — ABNORMAL HIGH (ref 0.61–1.24)
Creatinine, Ser: 1.22 mg/dL (ref 0.61–1.24)
GFR, Estimated: 44 mL/min — ABNORMAL LOW (ref >60)
GFR, Estimated: >60 mL/min (ref >60)
Glucose, Bld: 172 mg/dL — ABNORMAL HIGH (ref 70–99)
Glucose, Bld: 100 mg/dL — ABNORMAL HIGH (ref 70–99)
Potassium: 5 mmol/L (ref 3.5–5.1)
Potassium: 5 mmol/L (ref 3.5–5.1)
Sodium: 136 mmol/L (ref 135–145)
Sodium: 137 mmol/L (ref 135–145)

## 2020-12-26 LAB — RESP PANEL BY RT-PCR (FLU A&B, COVID) ARPGX2
Influenza A by PCR: NEGATIVE
Influenza B by PCR: NEGATIVE
SARS Coronavirus 2 by RT PCR: NEGATIVE

## 2020-12-26 MED ORDER — SODIUM CHLORIDE 0.9 % IV BOLUS
1000.0000 mL | Freq: Once | INTRAVENOUS | Status: AC
  Administered 2020-12-26: 1000 mL via INTRAVENOUS

## 2020-12-26 MED ORDER — SODIUM CHLORIDE 0.9 % IV SOLN: INTRAVENOUS | Status: DC

## 2020-12-26 MED ORDER — ADENOSINE 6 MG/2ML IV SOLN
6.0000 mg | Freq: Once | INTRAVENOUS | Status: AC
  Administered 2020-12-26: 6 mg via INTRAVENOUS

## 2020-12-26 MED ORDER — ADENOSINE 6 MG/2ML IV SOLN
INTRAVENOUS | Status: AC
  Administered 2020-12-26: 12 mg via INTRAVENOUS
  Filled 2020-12-26: qty 6

## 2020-12-26 MED ORDER — ADENOSINE 6 MG/2ML IV SOLN: 12.0000 mg | Freq: Once | INTRAVENOUS | Status: AC

## 2020-12-26 NOTE — ED Provider Notes (Addendum)
[facility] DEPT Provider Note   CSN: [id number] Arrival date & time: 12/26/20  [id number]     History Chief Complaint  Patient presents with   Palpitations    Bobby Obrien is a 60 y.o. male.  Patient states that he got a rapid heart rate starting Friday evening.  Its been constant no change.  Associated with some shortness of breath.  No real chest pain.  Patient states this is happened before.  But he is never had to be seen and converted on its own.  Patient's past medical history is significant for congestive heart failure.  Ejection fraction 5055% 2006 diabetes history of deep vein thrombosis in 2005 history of hyperlipidemia and hypertension.  Also patient no longer any blood thinners.     Past Medical History:  Diagnosis Date   CONGESTIVE HEART FAILURE 12/04/2006   2005 Dec lvf echo 50-55% ef 2007   DIABETES MELLITUS, TYPE II 12/04/2006   History of DVT (deep vein thrombosis)    with PE 12 2005   History of pulmonary embolism    76 05    with HF and hypertension   HYPERLIPIDEMIA 12/04/2006   HYPERTENSION 12/04/2006   PANIC DISORDER 12/04/2006   Personal history of venous thrombosis and embolism 12/04/2006    Patient Active Problem List   Diagnosis Date Noted   Diabetes mellitus without complication ([place]) [id number]   Visit for preventive health examination 11/22/2012   Statin intolerance 11/20/2012   Does not have health insurance 09/03/2011   Preventive measure 06/21/2010   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     Family History  Problem Relation Age of Onset   Kidney disease Mother    Hypertension Mother    Hypertension Father    Heart attack Father     Social History   Tobacco Use    Smoking status: Never   Smokeless tobacco: Never  Substance Use Topics   Alcohol use: Yes   Drug use: No    Home Medications Prior to Admission medications   Medication Sig Start Date End Date Taking? Authorizing Provider  aspirin 81 MG tablet Take 81 mg by mouth daily.    [provider]  dapagliflozin propanediol (FARXIGA) 5 MG TABS tablet Take 1 tablet (5 mg total) by mouth daily before breakfast. 12/02/20   Panosh, Standley Brooking, MD  ezetimibe (ZETIA) 10 MG tablet Take 1 tablet (10 mg total) by mouth daily. For lowering cholesterol 03/19/19   Panosh, Standley Brooking, MD  lisinopril-hydrochlorothiazide (ZESTORETIC) 20-25 MG tablet TAKE ONE TABLET BY MOUTH DAILY 10/28/20   Panosh, Standley Brooking, MD  metFORMIN (GLUCOPHAGE) 500 MG tablet TAKE FOUR TABLETS BY MOUTH EVERY MORNING WITH BREAKFAST 10/28/20   Panosh, Standley Brooking, MD    Allergies    Atorvastatin, Crestor [rosuvastatin calcium], and Simvastatin  Review of Systems   Review of Systems  Constitutional:  Negative for chills and fever.  HENT:  Negative for ear pain and sore throat.   Eyes:  Negative for pain and visual disturbance.  Respiratory:  Positive for shortness of breath. Negative for cough.   Cardiovascular:  Positive for palpitations. Negative for chest pain and leg swelling.  Gastrointestinal:  Negative for abdominal pain and vomiting.  Genitourinary:  Negative for dysuria  and hematuria.  Musculoskeletal:  Negative for arthralgias and back pain.  Skin:  Negative for color change and rash.  Neurological:  Negative for seizures and syncope.  All other systems reviewed and are negative.  Physical Exam Updated Vital Signs BP 121/86 (BP Location: Right Arm)   Pulse (!) 212   Temp 98 F (36.7 C) (Tympanic)   Resp 18   SpO2 100%   Physical Exam Vitals and nursing note reviewed.  Constitutional:      Appearance: Normal appearance. He is well-developed.  HENT:     Head: Normocephalic and atraumatic.  Eyes:     Extraocular  Movements: Extraocular movements intact.     Conjunctiva/sclera: Conjunctivae normal.     Pupils: Pupils are equal, round, and reactive to light.  Cardiovascular:     Rate and Rhythm: Regular rhythm. Tachycardia present.     Heart sounds: No murmur heard. Pulmonary:     Effort: Pulmonary effort is normal. No respiratory distress.     Breath sounds: Normal breath sounds. No wheezing or rales.  Abdominal:     Palpations: Abdomen is soft.     Tenderness: There is no abdominal tenderness.  Musculoskeletal:        General: No swelling. Normal range of motion.     Cervical back: Normal range of motion and neck supple.  Skin:    General: Skin is warm and dry.  Neurological:     General: No focal deficit present.     Mental Status: He is alert and oriented to person, place, and time.     Cranial Nerves: No cranial nerve deficit.     Sensory: No sensory deficit.    ED Results / Procedures / Treatments   Labs (all labs ordered are listed, but only abnormal results are displayed) Labs Reviewed  CBC WITH DIFFERENTIAL/PLATELET - Abnormal; Notable for the following components:      Result Value   WBC 12.3 (*)    All other components within normal limits  RESP PANEL BY RT-PCR (FLU A&B, COVID) [id number]  BASIC METABOLIC PANEL    EKG EKG Interpretation  Date/Time:  Sunday December 26 2020 18:43:24 EDT Ventricular Rate:  213 PR Interval:    QRS Duration: 76 QT Interval:  196 QTC Calculation: 369 R Axis:   -55 Text Interpretation: Supraventricular tachycardia Left anterior fascicular block Septal infarct , age undetermined Abnormal ECG No previous ECGs available Confirmed by Fredia Sorrow [phone]) on 12/26/2020 6:49:19 PM  Radiology No results found.  Procedures Procedures   Medications Ordered in ED Medications  adenosine (ADENOCARD) 6 MG/2ML injection 6 mg (6 mg Intravenous Given 12/26/20 1907)  adenosine (ADENOCARD) 6 MG/2ML injection 12 mg (12 mg Intravenous Given 12/26/20 1910)     ED Course  I have reviewed the triage vital signs and the nursing notes.  Pertinent labs & imaging results that were available during my care of the patient were reviewed by me and considered in my medical decision making (see chart for details).    MDM Rules/Calculators/A&P                          CRITICAL CARE Performed by: Fredia Sorrow Total critical care time: 60 minutes Critical care time was exclusive of separately billable procedures and treating other patients. Critical care was necessary to treat or prevent imminent or life-threatening deterioration. Critical care was time spent personally by me on the following activities: development of treatment plan with patient and/or  surrogate as well as nursing, discussions with consultants, evaluation of patient's response to treatment, examination of patient, obtaining history from patient or surrogate, ordering and performing treatments and interventions, ordering and review of laboratory studies, ordering and review of radiographic studies, pulse oximetry and re-evaluation of patient's condition.   Patient presenting with supraventricular tachycardia based on EKG.  Heart rate 213.  Patient states rate has been fast since Friday evening.  Denies any chest pain.  Maybe some mild shortness of breath.  Patient states this is occurred in the past.  But it is always reverted.  Patient here given 6 mg of adenosine through right antecubital IV with quick flush.  But it did not give any significant pause.  Gave him 12 mg of adenosine and got returned back to sinus rhythm.  We will do labs chest x-ray and monitor.  Patient not followed by cardiology.  Patient is remained in sinus rhythm following the conversion with adenosine.  However patient's BUN and creatinine was markedly abnormal normally his GFR is greater than 60.  It was in the 40s.  Figured this was probably due to him being in this rapid heart rate for the past 3 days.  Patient  received 1 L of fluid rechecked basic metabolic panel GFR is returned back to normal.  Creatinine not exactly back to normal.  But markedly improved.  Patient stable for discharge home follow-up with cardiology follow back up with his primary care doctor to have kidney function rechecked.  Final Clinical Impression(s) / ED Diagnoses Final diagnoses:  SVT (supraventricular tachycardia) [facility])    Rx / DC Orders ED Discharge Orders     None        Fredia Sorrow, MD 12/26/20 Laurena Bering, MD 12/26/20 [phone]

## 2020-12-26 NOTE — ED Triage Notes (Signed)
Patient presents in active SVT

## 2020-12-26 NOTE — ED Notes (Addendum)
Adenosine given at 12 mg at 1910 per MD verbal orders

## 2020-12-26 NOTE — ED Notes (Signed)
Pt verbalizes understanding of discharge instructions. Opportunity for questioning and answers were provided. Armand removed by staff, pt discharged from ED to home. Educated to f/u with Cardiology.

## 2020-12-26 NOTE — Discharge Instructions (Addendum)
Your kidney function with the fluids has returned back to normal with the heart being normalized.  Return for any rapid heartbeat lasting 40 minutes or longer.  Make an appointment follow-up with your primary care doctor for follow-up of your kidney function.  And also make an appointment follow-up with cardiology.

## 2020-12-28 ENCOUNTER — Ambulatory Visit (INDEPENDENT_AMBULATORY_CARE_PROVIDER_SITE_OTHER): Payer: No Typology Code available for payment source | Admitting: Internal Medicine

## 2020-12-28 ENCOUNTER — Encounter: Payer: Self-pay | Admitting: Internal Medicine

## 2020-12-28 ENCOUNTER — Other Ambulatory Visit: Payer: Self-pay

## 2020-12-28 VITALS — BP 118/78 | HR 89 | Temp 98.7°F | Ht 72.0 in | Wt 192.2 lb

## 2020-12-28 DIAGNOSIS — I471 Supraventricular tachycardia: Secondary | ICD-10-CM

## 2020-12-28 DIAGNOSIS — Z789 Other specified health status: Secondary | ICD-10-CM

## 2020-12-28 DIAGNOSIS — Z79899 Other long term (current) drug therapy: Secondary | ICD-10-CM

## 2020-12-28 DIAGNOSIS — E0865 Diabetes mellitus due to underlying condition with hyperglycemia: Secondary | ICD-10-CM

## 2020-12-28 DIAGNOSIS — I10 Essential (primary) hypertension: Secondary | ICD-10-CM

## 2020-12-28 MED ORDER — LISINOPRIL-HYDROCHLOROTHIAZIDE 20-25 MG PO TABS
0.5000 | ORAL_TABLET | Freq: Every day | ORAL | 0 refills | Status: DC
Start: 2020-12-28 — End: 2021-01-27

## 2020-12-28 MED ORDER — METOPROLOL TARTRATE 25 MG PO TABS: 12.5000 mg | ORAL_TABLET | Freq: Two times a day (BID) | ORAL | 3 refills | Status: DC

## 2020-12-28 NOTE — Progress Notes (Signed)
Chief Complaint  Patient presents with   Hospitalization Follow-up    HPI: Bobby Obrien 60 y.o. come in for FU ed visit where he presented with a heart rate over 200. Friday evening and low bp and hr 199   suddent onset feeling lightheaded he eventually went to the emergency room where he was given 2 doses of adenosine diagnosis of SVT was noted to have elevated creatinine and some dehydration given IV fluids creatinine came down to 1.22 but not yet to baseline of 1 Did hold blood pressure medicine when it was low. He did recently in the last 2 weeks started on Farxiga which is significantly helped his blood sugar. Feels pretty good no chest pain shortness of breath. ROS: See pertinent positives and negatives per HPI.  Has prostate biopsy scheduled for next week. Past Medical History:  Diagnosis Date   CONGESTIVE HEART FAILURE 12/04/2006   2005 Dec lvf echo 50-55% ef 2007   DIABETES MELLITUS, TYPE II 12/04/2006   History of DVT (deep vein thrombosis)    with PE 12 2005   History of pulmonary embolism    88 05    with HF and hypertension   HYPERLIPIDEMIA 12/04/2006   HYPERTENSION 12/04/2006   PANIC DISORDER 12/04/2006   Personal history of venous thrombosis and embolism 12/04/2006    Family History  Problem Relation Age of Onset   Kidney disease Mother    Hypertension Mother    Hypertension Father    Heart attack Father     Social History   Socioeconomic History   Marital status: Married    Spouse name: Not on file   Number of children: Not on file   Years of education: Not on file   Highest education level: Not on file  Occupational History   Not on file  Tobacco Use   Smoking status: Never   Smokeless tobacco: Never  Substance and Sexual Activity   Alcohol use: Yes   Drug use: No   Sexual activity: Not on file  Other Topics Concern   Not on file  Social History Narrative   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          Social Determinants of Health   Financial Resource Strain: Not on file  Food Insecurity: Not on file  Transportation Needs: Not on file  Physical Activity: Not on file  Stress: Not on file  Social Connections: Not on file    Outpatient Medications Prior to Visit  Medication Sig Dispense Refill   aspirin 81 MG tablet Take 81 mg by mouth daily.     dapagliflozin propanediol (FARXIGA) 5 MG TABS tablet Take 1 tablet (5 mg total) by mouth daily before breakfast. 30 tablet 3   metFORMIN (GLUCOPHAGE) 500 MG tablet TAKE FOUR TABLETS BY MOUTH EVERY MORNING WITH BREAKFAST 120 tablet 2   lisinopril-hydrochlorothiazide (ZESTORETIC) 20-25 MG tablet TAKE ONE TABLET BY MOUTH DAILY 90 tablet 0   ezetimibe (ZETIA) 10 MG tablet Take 1 tablet (10 mg total) by mouth daily. For lowering cholesterol 30 tablet 6   No facility-administered medications prior to visit.     EXAM:  BP 118/78 (BP Location: Left Arm, Patient Position: Sitting, Cuff Size: Normal)   Pulse 89   Temp 98.7 F (37.1 C) (Oral)   Ht 6' (1.829 m)   Wt 192 lb 3.2 oz (87.2 kg)  SpO2 97%   BMI 26.07 kg/m   Body mass index is 26.07 kg/m.  GENERAL: vitals reviewed and listed above, alert, oriented, appears well hydrated and in no acute distress HEENT: atraumatic, conjunctiva  clear, no obvious abnormalities on inspection of external nose and ears OP Mast NECK: no obvious masses on inspection palpation  LUNGS: clear to auscultation bilaterally, no wheezes, rales or rhonchi, good air movement Abdomen:  Sof,t normal bowel sounds without hepatosplenomegaly, no guarding rebound or masses no CVA tenderness CV: HRRR, no clubbing cyanosis or  peripheral edema nl cap refill  MS: moves all extremities without noticeable focal  abnormality PSYCH: pleasant and cooperative, no obvious depression or anxiety Lab Results  Component Value Date   WBC 12.3 (H) 12/26/2020   HGB 15.1 12/26/2020   HCT 44.8 12/26/2020   PLT 350  12/26/2020   GLUCOSE 100 (H) 12/26/2020   CHOL 184 07/06/2020   TRIG 76.0 07/06/2020   HDL 38.90 (L) 07/06/2020   LDLDIRECT 161.0 03/19/2019   LDLCALC 130 (H) 07/06/2020   ALT 16 07/06/2020   AST 14 07/06/2020   NA 137 12/26/2020   K 5.0 12/26/2020   CL 108 12/26/2020   CREATININE 1.22 12/26/2020   BUN 37 (H) 12/26/2020   CO2 19 (L) 12/26/2020   TSH 1.37 05/26/2014   PSA 8.19 (H) 07/06/2020   HGBA1C 7.7 (H) 07/06/2020   MICROALBUR 3.6 (H) 07/06/2020   BP Readings from Last 3 Encounters:  12/28/20 118/78  12/26/20 115/86  06/30/20 118/76   ED visit review ASSESSMENT AND PLAN:  Discussed the following assessment and plan:  Paroxysmal SVT (supraventricular tachycardia) ([place]) - Plan: Basic metabolic panel, TSH, T4, free, Hemoglobin A1c, Ambulatory referral to Cardiology  Diabetes mellitus due to underlying condition with hyperglycemia, without long-term current use of insulin ([place]) - Plan: Basic metabolic panel, TSH, T4, free, Hemoglobin A1c  Essential hypertension - Plan: Basic metabolic panel, TSH, T4, free, Hemoglobin A1c, Ambulatory referral to Cardiology  Medication management - Plan: Basic metabolic panel, TSH, T4, free, Hemoglobin A1c  Statin intolerance Decrease lisinopril /HCTZ to half a dose at this time want to avoid dehydration especially since been put on Farxiga recently. He also has lost a significant amount of weight over the number of years that has  helped his blood pressure.  He began at 230 and is now down to 192# SVT documented requiring in adenosine x2. Referral to cardiology Dr. Oval Linsey (sees someone in his family.) Prescription given for metoprolol 25 mg take a half twice daily but only if as needed and call for advice.  In the meantime. Plan thyroid test repeat BMP to assess renal function stability and A1c(even though only been on Farxiga for a few weeks) -Patient advised to return or notify health care team  if  new concerns arise. Record review    and plan referral   40 minutes  Patient Instructions  Will do referral to cardiology as planned .   In the meantime if1/2 dose  lisinopril hctz    to avoid low BP .   Stay hydrated .   Will give you a extra scrip that you can try taking if  fast heart rate occurs and you go to ED for help.   Fu lab  appt.    Standley Brooking. Jacquilyn Seldon M.D.

## 2020-12-28 NOTE — Patient Instructions (Addendum)
Will do referral to cardiology as planned .   In the meantime if1/2 dose  lisinopril hctz    to avoid low BP .   Stay hydrated .   Will give you a extra scrip that you can try taking if  fast heart rate occurs and you go to ED for help.   Fu lab  appt.

## 2021-01-06 DIAGNOSIS — C61 Malignant neoplasm of prostate: Secondary | ICD-10-CM

## 2021-01-06 HISTORY — DX: Malignant neoplasm of prostate: C61

## 2021-01-13 ENCOUNTER — Other Ambulatory Visit: Payer: Self-pay

## 2021-01-13 MED ORDER — DAPAGLIFLOZIN PROPANEDIOL 5 MG PO TABS: 5.0000 mg | ORAL_TABLET | Freq: Every day | ORAL | 3 refills | Status: DC

## 2021-01-14 ENCOUNTER — Telehealth: Payer: Self-pay | Admitting: Internal Medicine

## 2021-01-14 NOTE — Telephone Encounter (Signed)
Patient called today saying that he is wanting to speak to Dr. Velora Mediate CMA. He says that he is at the pharmacy and the prescription for dapagliflozin propanediol (FARXIGA) 5 MG TABS tablet is costing around $600 this month. He says that last month, it did not cost him anything.  Please advise.

## 2021-01-15 NOTE — Telephone Encounter (Signed)
Yes  last   time she sent over a coupon  .Please refer to her opinon about obtaining this medication  type.  Affordable.

## 2021-01-21 ENCOUNTER — Other Ambulatory Visit: Payer: Self-pay | Admitting: *Deleted

## 2021-01-21 MED ORDER — DAPAGLIFLOZIN PROPANEDIOL 10 MG PO TABS: ORAL_TABLET | ORAL | 0 refills | Status: DC

## 2021-01-21 MED ORDER — DAPAGLIFLOZIN PROPANEDIOL 5 MG PO TABS: 5.0000 mg | ORAL_TABLET | Freq: Every day | ORAL | 0 refills | Status: DC

## 2021-01-21 NOTE — Telephone Encounter (Signed)
Samples given.  

## 2021-01-24 ENCOUNTER — Other Ambulatory Visit: Payer: Self-pay | Admitting: Internal Medicine

## 2021-01-24 ENCOUNTER — Other Ambulatory Visit: Payer: Self-pay

## 2021-01-24 MED ORDER — DAPAGLIFLOZIN PROPANEDIOL 5 MG PO TABS: 5.0000 mg | ORAL_TABLET | Freq: Every day | ORAL | 3 refills | Status: DC

## 2021-01-27 ENCOUNTER — Other Ambulatory Visit: Payer: Self-pay | Admitting: Internal Medicine

## 2021-02-09 ENCOUNTER — Telehealth: Payer: Self-pay | Admitting: Internal Medicine

## 2021-02-09 NOTE — Telephone Encounter (Signed)
Patient is requesting a phone call back from [facility].  Patient could be contacted at [phone].  Please advise.

## 2021-02-11 ENCOUNTER — Other Ambulatory Visit: Payer: Self-pay | Admitting: *Deleted

## 2021-02-11 MED ORDER — DAPAGLIFLOZIN PROPANEDIOL 10 MG PO TABS: ORAL_TABLET | ORAL | 0 refills | Status: DC

## 2021-02-11 NOTE — Telephone Encounter (Signed)
Called patient back and he has not heard back from [employer] regarding approval/denial of application. Plan to follow up next week with company to determine status.

## 2021-03-14 ENCOUNTER — Telehealth: Payer: Self-pay | Admitting: Radiation Oncology

## 2021-03-22 ENCOUNTER — Telehealth: Payer: Self-pay | Admitting: Radiation Oncology

## 2021-03-28 ENCOUNTER — Telehealth: Payer: Self-pay | Admitting: Radiation Oncology

## 2021-04-04 NOTE — Progress Notes (Signed)
GU Location of Tumor / Histology: Prostate Ca  If Prostate Cancer, Gleason Score is (4 + 3) and PSA is (10.5 as of 01/17/21)  Biopsies  Dr. Jeffie Pollock        Past/Anticipated interventions by urology, if any:   Past/Anticipated interventions by medical oncology, if any:   Weight changes, if any:  Stable  IPSS:  3 SHIM:  24  Bowel/Bladder complaints, if any:  No  Nausea/Vomiting, if any: No  Pain issues, if any:  0/10  SAFETY ISSUES: Prior radiation?  No Pacemaker/ICD?  No Possible current pregnancy?  Male Is the patient on methotrexate?  No  Current Complaints / other details:  To learn more about radiation treatment option.

## 2021-04-05 ENCOUNTER — Ambulatory Visit
Admission: RE | Admit: 2021-04-05 | Discharge: 2021-04-05 | Disposition: A | Discharge status: Home / Self Care | Payer: No Typology Code available for payment source | Source: Ambulatory Visit | Attending: Radiation Oncology | Admitting: Radiation Oncology

## 2021-04-05 VITALS — Ht 72.0 in | Wt 203.0 lb

## 2021-04-05 DIAGNOSIS — C61 Malignant neoplasm of prostate: Secondary | ICD-10-CM | POA: Insufficient documentation

## 2021-04-05 NOTE — Progress Notes (Signed)
Called and introduced myself to patient as the prostate nurse navigator and discussed my role.  No barriers to care identified at this time.  He has a telephone consult with Dr. Tammi Klippel to discuss his radiation treatment options.  I gave him my contact number and asked him to call me with questions or concerns.  Verbalized understanding.

## 2021-04-05 NOTE — Progress Notes (Signed)
Radiation Oncology         [phone] ________________________________  Initial Outpatient Consultation - Conducted via Telephone due to current COVID-19 concerns for limiting patient exposure  Name: Bobby Obrien MRN: [id number]  Date: 04/05/2021  DOB: 1960/06/19  HE:NIDPOE, Standley Brooking, MD  Raynelle Bring, MD   REFERRING PHYSICIAN: Raynelle Bring, MD  DIAGNOSIS: 60 y.o. gentleman with Stage T1c adenocarcinoma of the prostate with Gleason score of 3+4, and PSA of 10.5.    ICD-10-CM   1. Malignant neoplasm of prostate ([place])  C61       HISTORY OF PRESENT ILLNESS: Bobby Obrien is a 60 y.o. male with a diagnosis of prostate cancer. He was initially noted to have an elevated PSA of 8.34 in 03/2019 by his primary care physician, Dr. Regis Bill.  A repeat PSA in 07/2020 remained elevated at 8.19 so he was referred for evaluation in urology by Dr. Jeffie Pollock on 12/01/20. Digital rectal examination was performed at that time revealing no nodules and a repeat PSA from that day showed further elevation to 10.5. The patient proceeded to transrectal ultrasound with 12 biopsies of the prostate on 01/17/21.  The prostate volume measured 103 cc.  Out of 12 core biopsies, 3 were positive.  The maximum Gleason score was 3+4, and this was seen in the right mid lateral (with perineural invasion) and right apex lateral. Additionally, Gleason 3+3 was seen in the right apex.     He was initially most interested in surgery so he met with Dr. Alinda Money on 03/11/21 to discuss prostatectomy but wanted to learn more about radiation before making a final decision.  The patient reviewed the biopsy results with his urologist and he has kindly been referred today for discussion of potential radiation treatment options.  PREVIOUS RADIATION THERAPY: No  PAST MEDICAL HISTORY:  Past Medical History:  Diagnosis Date   CONGESTIVE HEART FAILURE 12/04/2006   2005 Dec lvf echo 50-55% ef 2007   DIABETES MELLITUS, TYPE II 12/04/2006    History of DVT (deep vein thrombosis)    with PE 12 2005   History of pulmonary embolism    43 05    with HF and hypertension   HYPERLIPIDEMIA 12/04/2006   HYPERTENSION 12/04/2006   PANIC DISORDER 12/04/2006   Personal history of venous thrombosis and embolism 12/04/2006      PAST SURGICAL HISTORY:No past surgical history on file.  FAMILY HISTORY:  Family History  Problem Relation Age of Onset   Kidney disease Mother    Hypertension Mother    Hypertension Father    Heart attack Father     SOCIAL HISTORY: He is a Therapist, nutritional. 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          Social Determinants of Health   Financial Resource Strain: Not on  file  Food Insecurity: Not on file  Transportation Needs: Not on file  Physical Activity: Not on file  Stress: Not on file  Social Connections: Not on file  Intimate Partner Violence: Not on file    ALLERGIES: Atorvastatin, Crestor [rosuvastatin calcium], and Simvastatin  MEDICATIONS:  Current Outpatient Medications  Medication Sig Dispense Refill   aspirin 81 MG tablet Take 81 mg by mouth daily.     dapagliflozin propanediol (FARXIGA) 5 MG TABS tablet Take 1 tablet (5 mg total) by mouth daily before breakfast. 30 tablet 3   lisinopril-hydrochlorothiazide (ZESTORETIC) 20-25 MG tablet TAKE ONE TABLET BY MOUTH DAILY 90 tablet 0   metFORMIN (GLUCOPHAGE) 500 MG tablet TAKE FOUR TABLETS BY MOUTH EVERY MORNING WITH BREAKFAST 360  tablet 0   dapagliflozin propanediol (FARXIGA) 10 MG TABS tablet Take a half tab daily (Patient not taking: Reported on 04/05/2021) 14 tablet 0   dapagliflozin propanediol (FARXIGA) 5 MG TABS tablet Take 1 tablet (5 mg total) by mouth daily before breakfast. (Patient not taking: Reported on 04/05/2021) 7 tablet 0   metoprolol tartrate (LOPRESSOR) 25 MG tablet Take 0.5 tablets (12.5 mg total) by mouth 2 (two) times daily. If needed for tachycardia (Patient not taking: Reported on 04/05/2021) 20 tablet 3   No current facility-administered medications for this encounter.    REVIEW OF SYSTEMS:  On review of systems, the patient reports that he is doing well overall. He denies any chest pain, shortness of breath, cough, fevers, chills, night sweats, unintended weight changes. He denies any bowel disturbances, and denies abdominal pain, nausea or vomiting. He denies any new musculoskeletal or joint aches or pains. His IPSS was 3, indicating mild urinary symptoms. His SHIM was 24, indicating he does not have erectile dysfunction. A complete review of systems is obtained and is otherwise negative.    PHYSICAL EXAM:  Wt Readings from Last 3 Encounters:  04/05/21 203 lb (92.1 kg)  12/28/20 192 lb 3.2 oz (87.2 kg)  06/30/20 205 lb (93 kg)   Temp Readings from Last 3 Encounters:  12/28/20 98.7 F (37.1 C) (Oral)  12/26/20 98 F (36.7 C) (Tympanic)  06/30/20 99.2 F (37.3 C)   BP Readings from Last 3 Encounters:  12/28/20 118/78  12/26/20 115/86  06/30/20 118/76   Pulse Readings from Last 3 Encounters:  12/28/20 89  12/26/20 83  06/30/20 82   Pain Assessment Pain Score: 0-No pain/10  Physical exam not performed in light of telephone consult visit format.   KPS = 100  100 - Normal; no complaints; no evidence of disease. 90   - Able to carry on normal activity; minor signs or symptoms of disease. 80   - Normal activity with effort; some signs or symptoms of disease. 8   - Cares for  self; unable to carry on normal activity or to do active work. 60   - Requires occasional assistance, but is able to care for most of his personal needs. 50   - Requires considerable assistance and frequent medical care. 110   - Disabled; requires special care and assistance. 88   - Severely disabled; hospital admission is indicated although death not imminent. 40   - Very sick; hospital admission necessary; active supportive treatment necessary. 10   - Moribund; fatal processes progressing rapidly. 0     - Dead  Karnofsky DA, Abelmann WH, Craver LS and Burchenal [facility] [phone]) The use of the nitrogen mustards in the palliative treatment of carcinoma: with particular reference to  bronchogenic carcinoma Cancer 1 634-56  LABORATORY DATA:  Lab Results  Component Value Date   WBC 12.3 (H) 12/26/2020   HGB 15.1 12/26/2020   HCT 44.8 12/26/2020   MCV 94.7 12/26/2020   PLT 350 12/26/2020   Lab Results  Component Value Date   NA 137 12/26/2020   K 5.0 12/26/2020   CL 108 12/26/2020   CO2 19 (L) 12/26/2020   Lab Results  Component Value Date   ALT 16 07/06/2020   AST 14 07/06/2020   ALKPHOS 76 07/06/2020   BILITOT 0.5 07/06/2020     RADIOGRAPHY: No results found.    IMPRESSION/PLAN: This visit was conducted via Telephone to spare the patient unnecessary potential exposure in the healthcare setting during the current COVID-19 pandemic. 1. 60 y.o. gentleman with Stage T1c adenocarcinoma of the prostate with Gleason Score of 3+4, and PSA of 10.5. We discussed the patient's workup and outlined the nature of prostate cancer in this setting. The patient's T stage, Gleason's score, and PSA put him into the intermediate risk group. Accordingly, he is eligible for a variety of potential treatment options including brachytherapy, 5.5 weeks of external radiation, or prostatectomy. We discussed the available radiation techniques, and focused on the details and logistics and delivery. The patient is not  an ideal candidate for brachytherapy with a prostate volume of 103 cc. Therefore, we discussed and outlined the risks, benefits, short and long-term effects associated with external beam radiotherapy and compared and contrasted these with prostatectomy. We discussed the role of SpaceOAR in reducing the rectal toxicity associated with radiotherapy. He was encouraged to ask questions that were answered to his stated satisfaction.  At the end of the conversation, the patient is interested in moving forward with 5.5 weeks of external beam therapy. We will share our discussion with Dr. Jeffie Pollock and Dr. Alinda Money and make arrangements for fiducial markers and SpaceOAR gel placement in Jan. 2023, prior to simulation, to reduce rectal toxicity from radiotherapy. The patient appears to have a good understanding of his disease and our treatment recommendations which are of curative intent and is in agreement with the stated plan.  Therefore, we will move forward with treatment planning accordingly, in anticipation of beginning IMRT in early Jan. 2023, per patient preference to postpone starting treatment until after the holidays. We enjoyed meeting him and look forward to continuing to participate in his care.  Given current concerns for patient exposure during the COVID-19 pandemic, this encounter was conducted via telephone. The patient was notified in advance and was offered a MyChart meeting to allow for face to face communication but unfortunately reported that he did not have the appropriate resources/technology to support such a visit and instead preferred to proceed with telephone consult. The patient has given verbal consent for this type of encounter. The attendants for this meeting include Tyler Pita MD, Ashlyn Bruning PA-C, and patient, Bobby Obrien. During the encounter, Tyler Pita MD and Freeman Caldron PA-C were located at [facility] Radiation Oncology Department.  Patient, Bobby Obrien was located at home.  We personally spent 70 minutes in this encounter including chart review, reviewing radiological studies, meeting face-to-face with the patient, entering orders and completing documentation.    Nicholos Johns, PA-C    Tyler Pita, MD  [facility] Direct Dial: [phone]  Fax: [phone] [email or web address]  Skype  LinkedIn   This document serves as a record of services personally performed by Tyler Pita, MD  and [employer], PA-C. It was created on their behalf by Wilburn Mylar, a trained medical scribe. The creation of this record is based on the scribe's personal observations and the provider's statements to them. This document has been checked and approved by the attending provider.

## 2021-05-01 ENCOUNTER — Other Ambulatory Visit: Payer: Self-pay | Admitting: Internal Medicine

## 2021-05-06 ENCOUNTER — Other Ambulatory Visit: Payer: Self-pay

## 2021-05-06 MED ORDER — FARXIGA 10 MG PO TABS: ORAL_TABLET | ORAL | 4 refills | Status: DC

## 2021-05-10 NOTE — Telephone Encounter (Signed)
04/04/21 received notification for [facility] that they are unable to process trequest due to missing info - Prescription refill request. [facility] provider form completed, signed by PCP, & faxed to [phone]

## 2021-06-07 NOTE — Progress Notes (Signed)
RN was updated that [facility] has attempted to contact patient to set up fiducial marker's and SpaceOAR with no success.   RN left voicemail asking for return call.

## 2021-06-09 ENCOUNTER — Other Ambulatory Visit: Payer: Self-pay | Admitting: Urology

## 2021-06-09 ENCOUNTER — Telehealth: Payer: Self-pay | Admitting: *Deleted

## 2021-06-09 MED ORDER — FLEET ENEMA 7-19 GM/118ML RE ENEM
1.0000 | ENEMA | Freq: Once | RECTAL | Status: AC
Start: 2021-06-09 — End: ?

## 2021-06-09 NOTE — Telephone Encounter (Signed)
CALLED PATIENT TO INFORM OF FID. MARKER AND SPACE OAR PLACEMENT ON 07-12-21  AND HIS SIM APPT. ON 07-18-21- ARRIVAL TIME- 8:45 AM @ [facility], LVM FOR A RETURN CALL

## 2021-06-14 ENCOUNTER — Telehealth: Payer: Self-pay

## 2021-06-14 NOTE — Telephone Encounter (Signed)
---  Caller states he has been taking Metformin and he had to go to the ER due to the fact that it raises his heart rate. Caller states he states he was given [place] 5mg  and it keeps his blood sugar stable so he would like to know if he can take 10mg  instead 5mg  and just get rid of metformin altogether. Caller states he hasn't taken Metformin in about a week. Caller states he is not having any new or worsening symptoms at this time.  Comments User: Braxton Feathers, RN Date/Time Eilene Ghazi Time): 06/13/2021 4:14:49 PM Caller warm tranferred to scheduling department to have an ER follow up with his PCP.

## 2021-06-15 ENCOUNTER — Encounter: Payer: Self-pay | Admitting: Internal Medicine

## 2021-06-15 ENCOUNTER — Other Ambulatory Visit: Payer: Self-pay

## 2021-06-15 ENCOUNTER — Ambulatory Visit (INDEPENDENT_AMBULATORY_CARE_PROVIDER_SITE_OTHER): Payer: No Typology Code available for payment source | Admitting: Internal Medicine

## 2021-06-15 VITALS — BP 130/78 | HR 98 | Temp 98.5°F | Ht 72.0 in | Wt 196.6 lb

## 2021-06-15 DIAGNOSIS — Z79899 Other long term (current) drug therapy: Secondary | ICD-10-CM

## 2021-06-15 DIAGNOSIS — I10 Essential (primary) hypertension: Secondary | ICD-10-CM

## 2021-06-15 DIAGNOSIS — T887XXA Unspecified adverse effect of drug or medicament, initial encounter: Secondary | ICD-10-CM

## 2021-06-15 DIAGNOSIS — E0865 Diabetes mellitus due to underlying condition with hyperglycemia: Secondary | ICD-10-CM

## 2021-06-15 DIAGNOSIS — I471 Supraventricular tachycardia: Secondary | ICD-10-CM

## 2021-06-15 LAB — POCT GLYCOSYLATED HEMOGLOBIN (HGB A1C): Hemoglobin A1C: 7.2 % — AB (ref 4.0–5.6)

## 2021-06-15 NOTE — Progress Notes (Signed)
Chief Complaint  Patient presents with   Medication Consultation    Concerned about taking metformin     HPI: Bobby Obrien 61 y.o. come in for fu  rapid heart rate episodes  that took him to ed last august .  Dx as ssvt.  He now thinks that  metformin causing  the sx  where transient  hr   raised   190    at  atime  when stopped the metformin    not happening    evern before on fraxiga .   Says hr fine   on farxiga.   In retrospect feels that   was happening with metformin over the past years and not on farxiga   off metformin weeks and  no repeat racing heart . Wilder Glade helping his   bg well.   Never got a call about cardiology referral . ( See  august 23  notes)  never got the metoprolol filled  so hasn't  used this  med  ROS: See pertinent positives and negatives per HPI. Wilder Glade obtained  from pharmacy  team  program help. ( Had previous been on jardiance)  Is to have prostate cancer treatment with seed radiation next month. Past Medical History:  Diagnosis Date   CONGESTIVE HEART FAILURE 12/04/2006   2005 Dec lvf echo 50-55% ef 2007   DIABETES MELLITUS, TYPE II 12/04/2006   History of DVT (deep vein thrombosis)    with PE 12 2005   History of pulmonary embolism    36 05    with HF and hypertension   HYPERLIPIDEMIA 12/04/2006   HYPERTENSION 12/04/2006   PANIC DISORDER 12/04/2006   Personal history of venous thrombosis and embolism 12/04/2006    Family History  Problem Relation Age of Onset   Kidney disease Mother    Hypertension Mother    Hypertension Father    Heart attack Father     Social History   Socioeconomic History   Marital status: Married    Spouse name: Not on file   Number of children: Not on file   Years of education: Not on file   Highest education level: Not on file  Occupational History   Not on file  Tobacco Use   Smoking status: Never   Smokeless tobacco: Never  Substance and Sexual Activity   Alcohol use: Yes   Drug use: No   Sexual  activity: Not on file  Other Topics Concern   Not on file  Social History Narrative   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          Social Determinants of Health   Financial Resource Strain: Not on file  Food Insecurity: Not on file  Transportation Needs: Not on file  Physical Activity: Not on file  Stress: Not on file  Social Connections: Not on file    Outpatient Medications Prior to Visit  Medication Sig Dispense Refill   aspirin 81 MG tablet Take 81 mg by mouth daily.     dapagliflozin propanediol (FARXIGA) 5 MG TABS tablet Take 1 tablet (5 mg total) by mouth daily before breakfast. 7 tablet 0   dapagliflozin propanediol (FARXIGA) 5 MG TABS tablet Take 1 tablet (5 mg total) by mouth daily before breakfast. 30 tablet 3   FARXIGA 10 MG TABS tablet Take a half tab daily 45 tablet 4   lisinopril-hydrochlorothiazide (ZESTORETIC)  20-25 MG tablet TAKE ONE TABLET BY MOUTH DAILY 90 tablet 0   metoprolol tartrate (LOPRESSOR) 25 MG tablet Take 0.5 tablets (12.5 mg total) by mouth 2 (two) times daily. If needed for tachycardia 20 tablet 3   metFORMIN (GLUCOPHAGE) 500 MG tablet TAKE FOUR TABLETS BY MOUTH EVERY MORNING WITH BREAKFAST (Patient not taking: Reported on 06/15/2021) 360 tablet 0   Facility-Administered Medications Prior to Visit  Medication Dose Route Frequency Provider Last Rate Last Admin   sodium phosphate (FLEET) 7-19 GM/118ML enema 1 enema  1 enema Rectal Once [name], MD         EXAM:  BP 130/78 (BP Location: Left Arm, Patient Position: Sitting, Cuff Size: Normal)    Pulse 98    Temp 98.5 F (36.9 C) (Oral)    Ht 6' (1.829 m)    Wt 196 lb 9.6 oz (89.2 kg)    SpO2 97%    BMI 26.66 kg/m   Body mass index is 26.66 kg/m.  GENERAL: vitals reviewed and listed above, alert, oriented, appears well hydrated and in no acute distress HEENT: atraumatic, conjunctiva  clear, no obvious abnormalities on inspection of  external nose and ears OP : Masked NECK: no obvious masses on inspection palpation  LUNGS: clear to auscultation bilaterally, no wheezes, rales or rhonchi, good air movement CV: HRRR, no clubbing cyanosis or  peripheral edema nl cap refill  MS: moves all extremities without noticeable focal  abnormality PSYCH: pleasant and cooperative, no obvious depression or anxiety Lab Results  Component Value Date   WBC 12.3 (H) 12/26/2020   HGB 15.1 12/26/2020   HCT 44.8 12/26/2020   PLT 350 12/26/2020   GLUCOSE 100 (H) 12/26/2020   CHOL 184 07/06/2020   TRIG 76.0 07/06/2020   HDL 38.90 (L) 07/06/2020   LDLDIRECT 161.0 03/19/2019   LDLCALC 130 (H) 07/06/2020   ALT 16 07/06/2020   AST 14 07/06/2020   NA 137 12/26/2020   K 5.0 12/26/2020   CL 108 12/26/2020   CREATININE 1.22 12/26/2020   BUN 37 (H) 12/26/2020   CO2 19 (L) 12/26/2020   TSH 1.37 05/26/2014   PSA 8.19 (H) 07/06/2020   HGBA1C 7.2 (A) 06/15/2021   MICROALBUR 3.6 (H) 07/06/2020   BP Readings from Last 3 Encounters:  06/15/21 130/78  12/28/20 118/78  12/26/20 115/86    ASSESSMENT AND PLAN:  Discussed the following assessment and plan:  Paroxysmal SVT (supraventricular tachycardia) (HCC) history of - Plan: POC HgB A1c, Ambulatory referral to Cardiology  Diabetes mellitus due to underlying condition with hyperglycemia, without long-term current use of insulin (HCC) - Approved on Farxiga 5 mg but stopped the metformin okay to increase to 10 mg as a trial - Plan: POC HgB A1c, Ambulatory referral to Cardiology  Essential hypertension - Controlled - Plan: Ambulatory referral to Cardiology  Medication management  Medication side effect ? - Feels related to the metformin Patient had been referred to cardiology but did not get contacted about an appointment since last August, he did not get them at metoprolol filled and has not had to use it anyway. Will rerefer  He feels confident that the metformin was triggering the symptoms.   Of rapid heart rate All discussed with him also that other things can trigger and at this point okay to stay on the Farxiga increase to 10 mg he appears to be doing well on this in the past the problem was cost. Plan  fu 4-6 mos  earlier as  indicated. -Patient advised to return or notify health care team  if  new concerns arise.  Patient Instructions  A1c is 7.2 and ok  ( may be coming down)  Will refer to cardiology and ok for now to stay off the   metformin . Stay on [place] fu in  4 -6   months to check   fu labs and BG .    Let us know if no one contacts you about   cardiology referral.    Standley Brooking. Taysom Glymph M.D.

## 2021-06-15 NOTE — Progress Notes (Signed)
Reviewed pt chart for surgery scheduled for 07-12-2021 by Dr Milford Cage for Bobby Obrien Seed implants.  Noted pt ED visit in epic 08/ 21-2022 for SVT was given adenosine .  Pt followed up with his pcp 12-28-2020 and was referred to cardiology however pt did not go. Chart reviewed with anesthesia Dr Gifford Shave MDA ,  stated pt would need pcp clearance prior to surgery. Called and left message for [place], [place] scheduler for Dr Milford Cage, inform of pcp clearance needed.

## 2021-06-15 NOTE — Patient Instructions (Addendum)
A1c is 7.2 and ok  ( may be coming down)  Will refer to cardiology and ok for now to stay off the   metformin . Stay on [place] fu in  4 -6   months to check   fu labs and BG .    Let us know if no one contacts you about   cardiology referral.

## 2021-06-21 ENCOUNTER — Ambulatory Visit: Payer: No Typology Code available for payment source | Admitting: Internal Medicine

## 2021-06-22 ENCOUNTER — Telehealth: Payer: Self-pay

## 2021-06-22 NOTE — Telephone Encounter (Signed)
Dr. Milford Cage with [facility] specialist faxed over Surgical clearance forms for fiducial markers and spaceoar. I informed the pt to f/u with cardiology and pt has visit scheduled for 07/07/2021.

## 2021-07-07 ENCOUNTER — Encounter: Payer: Self-pay | Admitting: Internal Medicine

## 2021-07-07 ENCOUNTER — Ambulatory Visit (INDEPENDENT_AMBULATORY_CARE_PROVIDER_SITE_OTHER): Payer: No Typology Code available for payment source | Admitting: Internal Medicine

## 2021-07-07 ENCOUNTER — Other Ambulatory Visit: Payer: Self-pay

## 2021-07-07 ENCOUNTER — Encounter (HOSPITAL_BASED_OUTPATIENT_CLINIC_OR_DEPARTMENT_OTHER): Payer: Self-pay | Admitting: Urology

## 2021-07-07 VITALS — BP 130/80 | HR 91 | Ht 72.5 in | Wt 200.4 lb

## 2021-07-07 DIAGNOSIS — I471 Supraventricular tachycardia: Secondary | ICD-10-CM

## 2021-07-07 DIAGNOSIS — Z01810 Encounter for preprocedural cardiovascular examination: Secondary | ICD-10-CM

## 2021-07-07 MED ORDER — METOPROLOL SUCCINATE ER 25 MG PO TB24: 12.5000 mg | ORAL_TABLET | Freq: Every day | ORAL | 3 refills | Status: DC

## 2021-07-07 NOTE — Progress Notes (Signed)
Cardiology Office Note:    Date:  07/07/2021   ID:  Bobby Obrien, DOB 1961-02-04, MRN [id number]  PCP:  Burnis Medin, MD   [facility] Providers Cardiologist:  Janina Mayo, MD     Referring MD: Burnis Medin, MD   No chief complaint on file. SVT  History of Present Illness:    Bobby Obrien is a 61 y.o. 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 Rate 213 bpm 12/29/2020. He notes that his heart racing has increased thought associated with metformin.  He has not taken metoprolol daily.   He reports hx of palpations for 5-6 years. He had normal nuclear stress 2006 that was normal. No catheterization. He has no angina or dyspnea on exertion. He can do > 4 mets without any issues. He can run without symptoms. He has a healthy diet, except he eats butter and does have etoh.  Prior blood pressures have been well controlled. He takes half lisinopril-HCTz. Stated full pill let to low blood pressures  He was on statins and his legs were cramping. And he stopped this.    He notes a PE around 2006. The EMR notes CHF hx ?2008, he has not documentation of this. He reports no hx of orthopnea.  Has was diagnosed with prostate cancer, saw Dr. Tammi Klippel to discuss radiation therapy. He is planned for 5.5 weeks of external beam therapy. He is planned for Gold seed implants with Dr. Milford Cage. Due to plan for cardiology FU after his SVT event, referral was sent for preoperative cardiac risk assessment.   Family Hx- father had MI at 36. Mother- dementia, no heart disease, htn.    Social Hx: no smoking. 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   The 10-year ASCVD risk score (Arnett DK, et al., 2019) is: 26.7%   Values used to calculate the score:     Age:  73 years     Sex: Male     Is Non-Hispanic African American: Yes     Diabetic: Yes     Tobacco smoker: No     Systolic Blood Pressure: 191 mmHg     Is BP treated: Yes     HDL Cholesterol: 38.9 mg/dL     Total Cholesterol: 184 mg/dL    Cardiology Studies Nuclear 2006- normal Echo 2005- noted EF 50-55  Past Medical History:  Diagnosis Date   CONGESTIVE HEART FAILURE 12/04/2006   2005 Dec lvf echo 50-55% ef 2007   DIABETES MELLITUS, TYPE II 12/04/2006   History of DVT (deep vein thrombosis)    with PE 12 2005   History of pulmonary embolism    93 05    with HF and hypertension   HYPERLIPIDEMIA 12/04/2006   HYPERTENSION 12/04/2006   PANIC DISORDER 12/04/2006   Personal history of venous thrombosis and embolism 12/04/2006    No past surgical history on file.  Current Medications: Current Meds  Medication Sig   aspirin 81 MG tablet Take 81 mg by mouth daily.   dapagliflozin propanediol (FARXIGA) 5 MG TABS tablet Take 1 tablet (5 mg total) by mouth daily before breakfast. (Patient taking differently: Take 10 mg by mouth daily before breakfast.)   lisinopril-hydrochlorothiazide (ZESTORETIC) 20-25 MG tablet TAKE ONE  TABLET BY MOUTH DAILY (Patient taking differently: Takes half (1/2) a tablet every other day)   metoprolol succinate (TOPROL-XL) 25 MG 24 hr tablet Take 0.5 tablets (12.5 mg total) by mouth daily. Take with or immediately following a meal.     Allergies:   Atorvastatin, Crestor [rosuvastatin calcium], and Simvastatin   Social History   Socioeconomic History   Marital status: Married    Spouse name: Not on file   Number of children: Not on file   Years of education: Not on file   Highest education level: Not on file  Occupational History   Not on file  Tobacco Use   Smoking status: Never   Smokeless tobacco: Never  Substance and Sexual Activity   Alcohol use: Yes   Drug use: No   Sexual activity: Not on file  Other Topics Concern   Not on file  Social  History Narrative   HH of 2 married    Musician   Working evening arenas    [place]    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          Social Determinants of Health   Financial Resource Strain: Not on file  Food Insecurity: Not on file  Transportation Needs: Not on file  Physical Activity: Not on file  Stress: Not on file  Social Connections: Not on file     Family History: The patient's family history includes Heart attack in his father; Hypertension in his father and mother; Kidney disease in his mother.  ROS:   Please see the history of present illness.     All other systems reviewed and are negative.  EKGs/Labs/Other Studies Reviewed:    The following studies were reviewed today:   EKG:  EKG is  ordered today.  The ekg ordered today demonstrates   NSR, LAFB, septal q (seen 12/26/2020)  Recent Labs: 12/26/2020: BUN 37; Creatinine, Ser 1.22; Hemoglobin 15.1; Platelets 350; Potassium 5.0; Sodium 137  Recent Lipid Panel    Component Value Date/Time   CHOL 184 07/06/2020 1003   TRIG 76.0 07/06/2020 1003   HDL 38.90 (L) 07/06/2020 1003   CHOLHDL 5 07/06/2020 1003   VLDL 15.2 07/06/2020 1003   LDLCALC 130 (H) 07/06/2020 1003   LDLDIRECT 161.0 03/19/2019 1554     Risk Assessment/Calculations:           Physical Exam:    VS:  BP 130/80 (BP Location: Left Arm, Patient Position: Sitting)    Pulse 91    Ht 6' 0.5" (1.842 m)    Wt 200 lb 6.4 oz (90.9 kg)    SpO2 98%    BMI 26.81 kg/m     Wt Readings from Last 3 Encounters:  07/07/21 200 lb 6.4 oz (90.9 kg)  06/15/21 196 lb 9.6 oz (89.2 kg)  04/05/21 203 lb (92.1 kg)     GEN:  Well nourished, well developed in no acute distress HEENT: Normal NECK: No JVD; No carotid bruits LYMPHATICS: No lymphadenopathy CARDIAC: RRR, no murmurs, rubs, gallops RESPIRATORY:  Clear to auscultation without rales, wheezing or rhonchi  ABDOMEN: Soft, non-tender, non-distended MUSCULOSKELETAL:  No edema; No deformity  SKIN: Warm and  dry NEUROLOGIC:  Alert and oriented x 3 PSYCHIATRIC:  Normal affect   ASSESSMENT:    #Pre-op: He has acceptable cardiac risk for gold seed implant procedure and radiation for prostate CA. Will schedule his BB with hx of short RP tachycardia. He has no hx of ischemic dx or stroke. He can do >  4 METS; no ischemic evaluation needed.  SVT- Short RP tachycardia; Ddx includes AVNRT/AVRT. Will plan to schedule metoprolol and send referral to EP for consideration of EP study/ablation. Metformin not typically associated with SVT. - metop 25 mg XL daily -TTE [not absolutely needed prior to his seed implantation]  #CVD Risk mitigation: He will think about PCSK9 for statin intolerance. He described significant myalgias. LDL 130.  LDL goal at least < 100 mg/dL. He is unsure about doing the injections. For DM2, agree with farxiga. Will defer diabetes management to his PCP. A1c goal < 7%; he is near goal at 7.2.  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 Will be important to continue CVD risk mitigate per above if he were to require this therapy in the future. Derald Macleod et al. How to Treat Prostate Cancer With Androgen Deprivation and Minimize Cardiovascular Risk: A Therapeutic Tightrope. J Am Coll Cardiol CardioOnc 2021]  PLAN:    In order of problems listed above:  TTE Metoprolol 12.5 mg XL daily EP referral  Follow up 3 months- discuss PCSK9 inhibitor      Medication Adjustments/Labs and Tests Ordered: Current medicines are reviewed at length with the patient today.  Concerns regarding medicines are outlined above.  Orders Placed This Encounter  Procedures   Ambulatory referral to Cardiac Electrophysiology   EKG 12-Lead   ECHOCARDIOGRAM COMPLETE   Meds ordered this encounter  Medications   metoprolol succinate (TOPROL-XL) 25 MG  24 hr tablet    Sig: Take 0.5 tablets (12.5 mg total) by mouth daily. Take with or immediately following a meal.    Dispense:  45 tablet    Refill:  3    Patient Instructions  Medication Instructions:  STOP METOPROLOL TARTRATE  START: METOPROLOL SUCCINATE 12.5mg  ONCE DAILY  *If you need a refill on your cardiac medications before your next appointment, please call your pharmacy*  Testing/Procedures: Your physician has requested that you have an echocardiogram. Echocardiography is a painless test that uses sound waves to create images of your heart. It provides your doctor with information about the size and shape of your heart and how well your hearts chambers and valves are working. You may receive an ultrasound enhancing agent through an IV if needed to better visualize your heart during the echo.This procedure takes approximately one hour. There are no restrictions for this procedure. This will take place at the [address].    Follow-Up: At [facility], you and your health needs are our priority.  As part of our continuing mission to provide you with exceptional heart care, we have created designated Provider Care Teams.  These Care Teams include your primary Cardiologist (physician) and Advanced Practice Providers (APPs -  Physician Assistants and Nurse Practitioners) who all work together to provide you with the care you need, when you need it.  Your next appointment:   3 month(s)  The format for your next appointment:   In Person  Provider:   Janina Mayo, MD    Other Instructions AMBULATORY REFERRAL TO [place] THIS     Signed, Janina Mayo, MD  07/07/2021 3:16 PM    [place]

## 2021-07-07 NOTE — Patient Instructions (Signed)
Medication Instructions:  ?STOP METOPROLOL TARTRATE  ?START: METOPROLOL SUCCINATE 12.5mg  ONCE DAILY  ?*If you need a refill on your cardiac medications before your next appointment, please call your pharmacy* ? ?Testing/Procedures: ?Your physician has requested that you have an echocardiogram. Echocardiography is a painless test that uses sound waves to create images of your heart. It provides your doctor with information about the size and shape of your heart and how well your heart?s chambers and valves are working. You may receive an ultrasound enhancing agent through an IV if needed to better visualize your heart during the echo.This procedure takes approximately one hour. There are no restrictions for this procedure. This will take place at the [address].   ? ?Follow-Up: ?At [facility], you and your health needs are our priority.  As part of our continuing mission to provide you with exceptional heart care, we have created designated Provider Care Teams.  These Care Teams include your primary Cardiologist (physician) and Advanced Practice Providers (APPs -  Physician Assistants and Nurse Practitioners) who all work together to provide you with the care you need, when you need it. ? ?Your next appointment:   ?3 month(s) ? ?The format for your next appointment:   ?In Person ? ?Provider:   ?Janina Mayo, MD   ? ?Other Instructions ?AMBULATORY REFERRAL TO ELECTROPHYSIOLOGY- SOMEONE WILL REACH OUT TO YOU REGARDING THIS   ?

## 2021-07-08 ENCOUNTER — Encounter (HOSPITAL_BASED_OUTPATIENT_CLINIC_OR_DEPARTMENT_OTHER): Payer: Self-pay | Admitting: Urology

## 2021-07-08 ENCOUNTER — Other Ambulatory Visit: Payer: Self-pay

## 2021-07-08 NOTE — Progress Notes (Signed)
Spoke w/ via phone for pre-op interview--- pt ?Lab needs dos----   istat             ?Lab results------ current ekg in epic/ chart ?COVID test -----patient states asymptomatic no test needed ?Arrive at ------- 0830 on 07-12-2021 ?NPO after MN NO Solid Food.  Clear liquids from MN until--- 0730 ?Med rec completed ?Medications to take morning of surgery ----- pt to start taking toprol today, told if takes in morning to take morning of surgery ?Diabetic medication ----- do not take farxiga morning of surgery ?Patient instructed no nail polish to be worn day of surgery ?Patient instructed to bring photo id and insurance card day of surgery ?Patient aware to have Driver (ride ) -- sister, danielle Geurts/  ?caregiver for 24 hours after surgery -- wife, Olin Hauser ?Patient Special Instructions ----- will do one fleet enema night before surgery ?Pre-Op special Istructions ----- pt has cardiac office visit clearance note in epic 07-07-2021 by Dr Phineas Inches in epic/ chart ?Patient verbalized understanding of instructions that were given at this phone interview. ?Patient denies shortness of breath, chest pain, fever, cough at this phone interview.  ? ? ?Anesthesia Review:  HTN;  PSVT;  hx PE/ CHF  2005, none sice per pt;  DM 2 ?Refer to progress note on 06-15-2021 per Dr Gifford Shave MDA stated pt would need pcp clearance prior to surgery.  PCP note in epic 06-15-2021 pt referred to cardiology for clearance, office visit in epic/ chart 07-07-2021. ?Pt denies cardiac s&s, sob, and no peripheral edema. ? ?Pt was prescribed toprol by cardiology to start taking daily.  Pt stated he had not picked up medication and was not going to because he has not had issue fast heart rate.  Pt advised to pick up his medication from his pharmacy and start taking it today per cardiologist instructions, pt verbalized understanding if he comes in [place] having an episode anesthesia could possibly postpone his surgery. ? ? ?PCP: Dr Viona Gilmore. Regis Bill (lov 06-15-2021  epic ?Cardiologist : Dr Beckie Busing [facility] 07-07-2021 epic) ?EKG : 07-07-2021 ?Echo : 07-27-2005  (scanned in media) ?Stress test: 07-27-2004 epic ?Cardiac Cath :  no ?  ?Fasting Blood Sugar :  130s    / Checks Blood Sugar -- times a day:  once daily fasting and not fasting ?Blood Thinner/ Instructions /Last Dose: NO ?ASA / Instructions/ Last Dose :  ASA 81mg /  pt stated he did remember what dr Milford Cage instructions, advised to look at his information he stated he did receive from office and or call office for instructions if to stop or not ?

## 2021-07-11 NOTE — H&P (Signed)
Pt presents today for pre-operative history and physical exam in anticipation of fiducial markers and space oar placement by Dr. Milford Cage on 07/12/21. He is doing well and is without complaint.  ? ?Pt denies F/C, HA, CP, SOB, N/V, diarrhea/constipation, back pain, flank pain, hematuria, and dysuria.  ? ? ?HX:  ? ?CC: Prostate Cancer  ? ?Physician requesting consult: Dr. Irine Seal  ?PCP: Dr. Shanon Ace  ? ?Bobby Obrien is a 61 year old gentleman who was found to have an elevated PSA of 10.5 prompting a TRUS biopsy of the prostate on 01/17/21. This confirmed Gleason 3+4=7 adenocarcinoma with 3 out of 12 biopsy cores positive for malignancy. He works as a Therapist, nutritional and has a brother who is also a Therapist, nutritional who has played with Bobby Obrien.  ? ?Family history: He believes his uncle may have had prostate cancer.  ? ?Imaging studies: None.  ? ?PMH: He has a history of hypertension, hyperlipidemia, diabetes, paroxysmal SVT, and history of pulmonary embolism.  ?PSH: No abdominal surgeries.  ? ?TNM stage: cT1c Nx Mx  ?PSA: 10.5  ?Gleason score: 3+4=7 (GG 2)  ?Biopsy (01/17/21): 3/12 cores positive  ?Left: Benign  ?Right: R apex (30%, 3+3=6), R lateral apex (50%, 3+4=7), R lateral mid (70%, 3+4=7, PNI)  ?Prostate volume: 103 cc  ? ?Nomogram  ?OC disease: 59%  ?EPE: 39%  ?SVI: 4%  ?LNI: 4%  ?PFS (5 year, 10 year): 78%, 65%  ? ?Urinary function: IPSS is 7.  ?Erectile function: SHIM score is 22. He does not require PDE 5 inhibitors.  ? ?  ?ALLERGIES: None  ? ?MEDICATIONS: Aspirin 81 mg tablet,chewable  ?Ezetimibe 10 mg tablet  ?Farxiga 10 mg tablet  ?Lisinopril-Hydrochlorothiazide 20 mg-25 mg tablet  ?  ? ?GU PSH: Prostate Needle Biopsy - 01/17/2021 ? ?  ?   ?PSH Notes: Finger surgery  ? ?NON-GU PSH: Surgical Pathology, Gross And Microscopic Examination For Prostate Needle - 01/17/2021 ? ?  ? ?GU PMH: BPH w/o LUTS - 03/11/2021, - 12/01/2020 ?Prostate Cancer - 03/11/2021, T1c Nx Mx Gleason 7(3+4) prostate cancer with a 128m prostate. I  discussed his treatment options. I don't believe he is a good candidate for surveillance and his prostate is too big for seeds, cryo and HIFU. His options including EXRT and RALP and I reviewed the risks and benefits of both in detail and gave him the appropriate handouts. He is inclined toward surgery so I will refer him internally for that. , - 02/02/2021 ?BPH w/LUTS, His prostate is 1044mwhich limits his treatment options but he has only mild/mod LUTS. - 02/02/2021 ?Elevated PSA - 02/02/2021, - 01/17/2021, He has an elevated but stable PSA and a benign exam but he had an ejaculation prior to the blood draw. I am going to repeat the PSA today and if it remains elevated, I will proceed with a biopsy and reviewed the risks of bleeding, infection and voiding difficulty. If the PSA is down, I will repeat at intervals. , - 12/01/2020 ?Nocturia - 02/02/2021 ?  ?   ?PMH Notes: History of DVT  ?Panic disorder  ?Pulmonary embolism  ?Congestive heart failure  ? ?NON-GU PMH: Diabetes Type 2 ?Hyperlipidemia, unspecified ?Hypertension ?Pulmonary Embolism, History ?  ? ?FAMILY HISTORY: Heart Attack - Father ?Hypertension - Mother, Father ?kidney disease - Mother  ? ?SOCIAL HISTORY: Marital Status: Married ?Preferred Language: [place]nicity: Not Hispanic Or Latino; Race: Black or African American ?Current Smoking Status: Patient has never smoked.  ? ?Tobacco Use Assessment Completed:  Used Tobacco in last 30 days? ?Does not use smokeless tobacco. ?Does drink.  ?Does not use drugs. ?Does not drink caffeine. ?Has not had a blood transfusion. ?  ?  Notes: ETOH 2 drinks per weekend   ? ?REVIEW OF SYSTEMS:    ?GU Review Male:   Patient denies frequent urination, hard to postpone urination, burning/ pain with urination, get up at night to urinate, leakage of urine, stream starts and stops, trouble starting your stream, have to strain to urinate , erection problems, and penile pain.  ?Gastrointestinal (Upper):   Patient denies nausea,  indigestion/ heartburn, and vomiting.  ?Gastrointestinal (Lower):   Patient denies diarrhea and constipation.  ?Constitutional:   Patient denies fever, night sweats, weight loss, and fatigue.  ?Skin:   Patient denies skin rash/ lesion and itching.  ?Eyes:   Patient denies blurred vision and double vision.  ?Ears/ Nose/ Throat:   Patient denies sore throat and sinus problems.  ?Hematologic/Lymphatic:   Patient denies swollen glands and easy bruising.  ?Cardiovascular:   Patient denies leg swelling and chest pains.  ?Respiratory:   Patient denies cough and shortness of breath.  ?Endocrine:   Patient denies excessive thirst.  ?Musculoskeletal:   Patient denies back pain and joint pain.  ?Neurological:   Patient denies headaches and dizziness.  ?Psychologic:   Patient denies depression and anxiety.  ? ?VITAL SIGNS:    ?  06/21/2021 03:55 PM  ?Weight 198 lb / 89.81 kg  ?Height 72.5 in / 184.15 cm  ?BP 128/83 mmHg  ?Heart Rate 102 /min  ?Temperature 98.6 F / 37 C  ?BMI 26.5 kg/m?  ? ?MULTI-SYSTEM PHYSICAL EXAMINATION:    ?Constitutional: Well-nourished. No physical deformities. Normally developed. Good grooming.  ?Neck: Neck symmetrical, not swollen. Normal tracheal position.  ?Respiratory: Normal breath sounds. No labored breathing, no use of accessory muscles.   ?Cardiovascular: Regular rate and rhythm. No murmur, no gallop.   ?Lymphatic: No enlargement of neck, axillae, groin.  ?Skin: No paleness, no jaundice, no cyanosis. No lesion, no ulcer, no rash.  ?Neurologic / Psychiatric: Oriented to time, oriented to place, oriented to person. No depression, no anxiety, no agitation.  ?Gastrointestinal: No mass, no tenderness, no rigidity, non obese abdomen.  ?Eyes: Normal conjunctivae. Normal eyelids.  ?Ears, Nose, Mouth, and Throat: Left ear no scars, no lesions, no masses. Right ear no scars, no lesions, no masses. Nose no scars, no lesions, no masses. Normal hearing. Normal lips.  ?Musculoskeletal: Normal gait and station  of head and neck.  ? ?  ?Complexity of Data:  ?Records Review:   Previous Patient Records  ?Urine Test Review:   Urinalysis  ? 06/21/21  ?Urinalysis  ?Urine Appearance Clear   ?Urine Color Yellow   ?Urine Glucose 2+ mg/dL  ?Urine Bilirubin Neg mg/dL  ?Urine Ketones Neg mg/dL  ?Urine Specific Gravity 1.025   ?Urine Blood Neg ery/uL  ?Urine pH <=5.0   ?Urine Protein Neg mg/dL  ?Urine Urobilinogen 0.2 mg/dL  ?Urine Nitrites Neg   ?Urine Leukocyte Esterase Neg leu/uL  ? ?PROCEDURES:    ? ?     Urinalysis - 81003 ?Dipstick Dipstick Cont'd  ?Color: Yellow Bilirubin: Neg mg/dL  ?Appearance: Clear Ketones: Neg mg/dL  ?Specific Gravity: 1.025 Blood: Neg ery/uL  ?pH: <=5.0 Protein: Neg mg/dL  ?Glucose: 2+ mg/dL Urobilinogen: 0.2 mg/dL  ?  Nitrites: Neg  ?  Leukocyte Esterase: Neg leu/uL  ? ? ?ASSESSMENT:  ?    ICD-10 Details  ?1 GU:   Prostate  Cancer - C61   ? ?PLAN:    ? ? ?      Medications ?Stop Meds: Metformin Er Gastric 500 mg tablet, er gastric retention 24 hr  ?Discontinue: 06/21/2021  - Reason: The medication cycle was completed.  ?Levofloxacin 750 mg tablet 1 po 1 hour prior to the procedure  Start: 12/01/2020  ?Discontinue: 06/21/2021  - Reason: The medication cycle was completed.  ?  ? ? ?      Schedule ?Return Visit/Planned Activity: Keep Scheduled Appointment - Schedule Surgery  ? ? ?      Document ?Letter(s):  Created for Patient: Clinical Summary  ? ? ?     Notes:   There are no changes in the patients history or physical exam since last evaluation by Dr. Alinda Money. Pt is scheduled to undergo fiducial marker and space oar placement on 07/12/21.  ?  ? ? ?

## 2021-07-12 ENCOUNTER — Encounter (HOSPITAL_BASED_OUTPATIENT_CLINIC_OR_DEPARTMENT_OTHER)
Admission: RE | Disposition: A | Discharge status: Home / Self Care | Payer: Self-pay | Source: Ambulatory Visit | Attending: Urology

## 2021-07-12 ENCOUNTER — Ambulatory Visit (HOSPITAL_BASED_OUTPATIENT_CLINIC_OR_DEPARTMENT_OTHER): Payer: No Typology Code available for payment source | Admitting: Anesthesiology

## 2021-07-12 ENCOUNTER — Other Ambulatory Visit: Payer: Self-pay

## 2021-07-12 ENCOUNTER — Ambulatory Visit (HOSPITAL_BASED_OUTPATIENT_CLINIC_OR_DEPARTMENT_OTHER)
Admission: RE | Admit: 2021-07-12 | Discharge: 2021-07-12 | Disposition: A | Discharge status: Home / Self Care | Payer: No Typology Code available for payment source | Source: Ambulatory Visit | Attending: Urology | Admitting: Urology

## 2021-07-12 ENCOUNTER — Encounter (HOSPITAL_BASED_OUTPATIENT_CLINIC_OR_DEPARTMENT_OTHER): Payer: Self-pay | Admitting: Urology

## 2021-07-12 DIAGNOSIS — C61 Malignant neoplasm of prostate: Secondary | ICD-10-CM

## 2021-07-12 DIAGNOSIS — F419 Anxiety disorder, unspecified: Secondary | ICD-10-CM | POA: Insufficient documentation

## 2021-07-12 DIAGNOSIS — Z7984 Long term (current) use of oral hypoglycemic drugs: Secondary | ICD-10-CM

## 2021-07-12 DIAGNOSIS — E119 Type 2 diabetes mellitus without complications: Secondary | ICD-10-CM

## 2021-07-12 DIAGNOSIS — Z79899 Other long term (current) drug therapy: Secondary | ICD-10-CM | POA: Insufficient documentation

## 2021-07-12 DIAGNOSIS — E785 Hyperlipidemia, unspecified: Secondary | ICD-10-CM | POA: Insufficient documentation

## 2021-07-12 DIAGNOSIS — I110 Hypertensive heart disease with heart failure: Secondary | ICD-10-CM | POA: Insufficient documentation

## 2021-07-12 DIAGNOSIS — I10 Essential (primary) hypertension: Secondary | ICD-10-CM

## 2021-07-12 DIAGNOSIS — Z86711 Personal history of pulmonary embolism: Secondary | ICD-10-CM | POA: Insufficient documentation

## 2021-07-12 HISTORY — DX: Supraventricular tachycardia: I47.1

## 2021-07-12 HISTORY — DX: Hyperlipidemia, unspecified: E78.5

## 2021-07-12 HISTORY — DX: Malignant neoplasm of prostate: C61

## 2021-07-12 HISTORY — DX: Supraventricular tachycardia, unspecified: I47.10

## 2021-07-12 HISTORY — DX: Personal history of other diseases of the respiratory system: Z87.09

## 2021-07-12 HISTORY — PX: SPACE OAR INSTILLATION: SHX6769

## 2021-07-12 HISTORY — DX: Type 2 diabetes mellitus without complications: E11.9

## 2021-07-12 HISTORY — DX: Panic disorder (episodic paroxysmal anxiety): F41.0

## 2021-07-12 HISTORY — DX: Tachycardia, unspecified: R00.0

## 2021-07-12 HISTORY — DX: Essential (primary) hypertension: I10

## 2021-07-12 HISTORY — PX: GOLD SEED IMPLANT: SHX6343

## 2021-07-12 HISTORY — DX: Benign prostatic hyperplasia with lower urinary tract symptoms: N40.1

## 2021-07-12 HISTORY — DX: Presence of dental prosthetic device (complete) (partial): Z97.2

## 2021-07-12 LAB — GLUCOSE, CAPILLARY: Glucose-Capillary: 159 mg/dL — ABNORMAL HIGH (ref 70–99)

## 2021-07-12 LAB — POCT I-STAT, CHEM 8
BUN: 21 mg/dL (ref 8–23)
Calcium, Ion: 1.23 mmol/L (ref 1.15–1.40)
Chloride: 100 mmol/L (ref 98–111)
Creatinine, Ser: 1.2 mg/dL (ref 0.61–1.24)
Glucose, Bld: 166 mg/dL — ABNORMAL HIGH (ref 70–99)
HCT: 56 % — ABNORMAL HIGH (ref 39.0–52.0)
Hemoglobin: 19 g/dL — ABNORMAL HIGH (ref 13.0–17.0)
Potassium: 4.3 mmol/L (ref 3.5–5.1)
Sodium: 139 mmol/L (ref 135–145)
TCO2: 27 mmol/L (ref 22–32)

## 2021-07-12 SURGERY — INSERTION, GOLD SEEDS
Anesthesia: Monitor Anesthesia Care

## 2021-07-12 MED ORDER — CEFAZOLIN SODIUM-DEXTROSE 2-4 GM/100ML-% IV SOLN
INTRAVENOUS | Status: AC
  Filled 2021-07-12: qty 100

## 2021-07-12 MED ORDER — ACETAMINOPHEN 500 MG PO TABS
ORAL_TABLET | ORAL | Status: AC
  Filled 2021-07-12: qty 2

## 2021-07-12 MED ORDER — FENTANYL CITRATE (PF) 100 MCG/2ML IJ SOLN
INTRAMUSCULAR | Status: DC | PRN
  Administered 2021-07-12: 50 ug via INTRAVENOUS

## 2021-07-12 MED ORDER — CEFAZOLIN SODIUM-DEXTROSE 2-4 GM/100ML-% IV SOLN
2.0000 g | INTRAVENOUS | Status: AC
  Administered 2021-07-12: 2 g via INTRAVENOUS

## 2021-07-12 MED ORDER — LIDOCAINE HCL (PF) 1 % IJ SOLN
INTRAMUSCULAR | Status: DC | PRN
Start: 2021-07-12 — End: 2021-07-12
  Administered 2021-07-12: 12 mL

## 2021-07-12 MED ORDER — PROPOFOL 1000 MG/100ML IV EMUL
INTRAVENOUS | Status: AC
  Filled 2021-07-12: qty 100

## 2021-07-12 MED ORDER — FENTANYL CITRATE (PF) 100 MCG/2ML IJ SOLN: 25.0000 ug | INTRAMUSCULAR | Status: DC | PRN

## 2021-07-12 MED ORDER — ONDANSETRON HCL 4 MG/2ML IJ SOLN
INTRAMUSCULAR | Status: DC | PRN
  Administered 2021-07-12: 4 mg via INTRAVENOUS

## 2021-07-12 MED ORDER — MIDAZOLAM HCL 5 MG/5ML IJ SOLN
INTRAMUSCULAR | Status: DC | PRN
Start: 2021-07-12 — End: 2021-07-12
  Administered 2021-07-12: 2 mg via INTRAVENOUS

## 2021-07-12 MED ORDER — SODIUM CHLORIDE (PF) 0.9 % IJ SOLN
INTRAMUSCULAR | Status: DC | PRN
  Administered 2021-07-12 (×3): 10 mL

## 2021-07-12 MED ORDER — FENTANYL CITRATE (PF) 100 MCG/2ML IJ SOLN
INTRAMUSCULAR | Status: AC
  Filled 2021-07-12: qty 2

## 2021-07-12 MED ORDER — LACTATED RINGERS IV SOLN: INTRAVENOUS | Status: DC

## 2021-07-12 MED ORDER — ACETAMINOPHEN 500 MG PO TABS
1000.0000 mg | ORAL_TABLET | Freq: Once | ORAL | Status: AC
  Administered 2021-07-12: 1000 mg via ORAL

## 2021-07-12 MED ORDER — MIDAZOLAM HCL 2 MG/2ML IJ SOLN
INTRAMUSCULAR | Status: AC
  Filled 2021-07-12: qty 2

## 2021-07-12 MED ORDER — DEXAMETHASONE SODIUM PHOSPHATE 10 MG/ML IJ SOLN
INTRAMUSCULAR | Status: DC | PRN
  Administered 2021-07-12: 5 mg via INTRAVENOUS

## 2021-07-12 MED ORDER — PROPOFOL 500 MG/50ML IV EMUL
INTRAVENOUS | Status: DC | PRN
Start: 2021-07-12 — End: 2021-07-12
  Administered 2021-07-12: 125 ug/kg/min via INTRAVENOUS

## 2021-07-12 SURGICAL SUPPLY — 19 items
COVER BACK TABLE 60X90IN (DRAPES) ×3 IMPLANT
DRSG TEGADERM 4X4.75 (GAUZE/BANDAGES/DRESSINGS) ×5 IMPLANT
DRSG TEGADERM 8X12 (GAUZE/BANDAGES/DRESSINGS) ×6 IMPLANT
GAUZE SPONGE 4X4 12PLY STRL LF (GAUZE/BANDAGES/DRESSINGS) ×1 IMPLANT
GLOVE SURG ENC MOIS LTX SZ7.5 (GLOVE) ×3 IMPLANT
GLOVE SURG LTX SZ8 (GLOVE) ×3 IMPLANT
GLOVE SURG ORTHO LTX SZ8.5 (GLOVE) ×3 IMPLANT
GOWN STRL REUS W/TWL XL LVL3 (GOWN DISPOSABLE) ×3 IMPLANT
IMPL SPACEOAR VUE SYSTEM (Spacer) ×2 IMPLANT
IMPLANT SPACEOAR VUE SYSTEM (Spacer) ×2 IMPLANT
KIT TURNOVER CYSTO (KITS) ×3 IMPLANT
MARKER GOLD PRELOAD 1.2X3 (Urological Implant) ×2 IMPLANT
NDL SPNL 22GX7 QUINCKE BK (NEEDLE) IMPLANT
NEEDLE SPNL 22GX7 QUINCKE BK (NEEDLE) ×2 IMPLANT
PACK CYSTO (CUSTOM PROCEDURE TRAY) ×3 IMPLANT
SEED GOLD PRELOAD 1.2X3 (Urological Implant) ×2 IMPLANT
SURGILUBE 2OZ TUBE FLIPTOP (MISCELLANEOUS) ×3 IMPLANT
SYR 20ML LL LF (SYRINGE) IMPLANT
UNDERPAD 30X36 HEAVY ABSORB (UNDERPADS AND DIAPERS) ×6 IMPLANT

## 2021-07-12 NOTE — Transfer of Care (Signed)
Immediate Anesthesia Transfer of Care Note ? ?Patient: Bobby Obrien ? ?Procedure(s) Performed: GOLD SEED IMPLANT ?SPACE OAR INSTILLATION ? ?Patient Location: PACU ? ?Anesthesia Type:MAC ? ?Level of Consciousness: drowsy and patient cooperative ? ?Airway & Oxygen Therapy: Patient Spontanous Breathing and Patient connected to face mask oxygen ? ?Post-op Assessment: Report given to RN and Post -op Vital signs reviewed and stable ? ?Post vital signs: Reviewed and stable ? ?Last Vitals:  ?Vitals Value Taken Time  ?BP 132/73 07/12/21 1036  ?Temp    ?Pulse 89 07/12/21 1036  ?Resp 15 07/12/21 1037  ?SpO2 99 % 07/12/21 1036  ?Vitals shown include unvalidated device data. ? ?Last Pain:  ?Vitals:  ? 07/12/21 0854  ?TempSrc: Oral  ?PainSc: 0-No pain  ?   ? ?Patients Stated Pain Goal: 5 (07/12/21 0854) ? ?Complications: No notable events documented. ?

## 2021-07-12 NOTE — Interval H&P Note (Signed)
History and Physical Interval Note: ? ?07/12/2021 ?8:54 AM ? ?Bobby Obrien  has presented today for surgery, with the diagnosis of PROSTATE CANCER.  The various methods of treatment have been discussed with the patient and family. After consideration of risks, benefits and other options for treatment, the patient has consented to  Procedure(s) with comments: ?GOLD SEED IMPLANT (N/A) - 30 MINS FOR CASE ?SPACE OAR INSTILLATION (N/A) as a surgical intervention.  The patient's history has been reviewed, patient examined, no change in status, stable for surgery.  I have reviewed the patient's chart and labs.  Questions were answered to the patient's satisfaction.   ? ? ?Bobby Obrien ? ? ?

## 2021-07-12 NOTE — Anesthesia Preprocedure Evaluation (Addendum)
Anesthesia Evaluation  ?Patient identified by MRN, date of birth, ID band ?Patient awake ? ? ? ?Reviewed: ?Allergy & Precautions, H&P , NPO status , Patient's Chart, lab work & pertinent test results, reviewed documented beta blocker date and time  ? ?Airway ?Mallampati: II ? ?TM Distance: >3 FB ?Neck ROM: Full ? ? ? Dental ?no notable dental hx. ?(+) Teeth Intact, Dental Advisory Given ?  ?Pulmonary ?neg pulmonary ROS,  ?  ?Pulmonary exam normal ?breath sounds clear to auscultation ? ? ? ? ? ? Cardiovascular ?hypertension, Pt. on medications and Pt. on home beta blockers ?+ dysrhythmias Supra Ventricular Tachycardia  ?Rhythm:Regular Rate:Normal ? ? ?  ?Neuro/Psych ?Anxiety negative neurological ROS ?   ? GI/Hepatic ?negative GI ROS, Neg liver ROS,   ?Endo/Other  ?diabetes, Type 2, Oral Hypoglycemic Agents ? Renal/GU ?negative Renal ROS  ?negative genitourinary ?  ?Musculoskeletal ? ? Abdominal ?  ?Peds ? Hematology ?negative hematology ROS ?(+)   ?Anesthesia Other Findings ? ? Reproductive/Obstetrics ?negative OB ROS ? ?  ? ? ? ? ? ? ? ? ? ? ? ? ? ?  ?  ? ? ? ? ? ? ? ?Anesthesia Physical ?Anesthesia Plan ? ?ASA: 2 ? ?Anesthesia Plan: MAC  ? ?Post-op Pain Management: Tylenol PO (pre-op)* and Minimal or no pain anticipated  ? ?Induction: Intravenous ? ?PONV Risk Score and Plan: 2 and Propofol infusion and Ondansetron ? ?Airway Management Planned: Simple Face Mask and Natural Airway ? ?Additional Equipment:  ? ?Intra-op Plan:  ? ?Post-operative Plan:  ? ?Informed Consent: I have reviewed the patients History and Physical, chart, labs and discussed the procedure including the risks, benefits and alternatives for the proposed anesthesia with the patient or authorized representative who has indicated his/her understanding and acceptance.  ? ? ? ?Dental advisory given ? ?Plan Discussed with: CRNA ? ?Anesthesia Plan Comments:   ? ? ? ? ? ? ?Anesthesia Quick Evaluation ? ?

## 2021-07-12 NOTE — Anesthesia Postprocedure Evaluation (Signed)
Anesthesia Post Note ? ?Patient: Bobby Obrien ? ?Procedure(s) Performed: GOLD SEED IMPLANT ?SPACE OAR INSTILLATION ? ?  ? ?Patient location during evaluation: PACU ?Anesthesia Type: MAC ?Level of consciousness: awake and alert ?Pain management: pain level controlled ?Vital Signs Assessment: post-procedure vital signs reviewed and stable ?Respiratory status: spontaneous breathing, nonlabored ventilation and respiratory function stable ?Cardiovascular status: stable and blood pressure returned to baseline ?Postop Assessment: no apparent nausea or vomiting ?Anesthetic complications: no ? ? ?No notable events documented. ? ?Last Vitals:  ?Vitals:  ? 07/12/21 1047 07/12/21 1100  ?BP: 109/72 123/76  ?Pulse: 80 72  ?Resp: 20 16  ?Temp:    ?SpO2: 99% 96%  ?  ?Last Pain:  ?Vitals:  ? 07/12/21 1100  ?TempSrc:   ?PainSc: 0-No pain  ? ? ?  ?  ?  ?  ?  ?  ? ?Ellisyn Icenhower,W. EDMOND ? ? ? ? ?

## 2021-07-12 NOTE — Op Note (Signed)
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 ?Postoperative diagnosis: Clinically localized adenocarcinoma of the prostate ? ?Procedure: 1) Placement of fiducial markers into prostate ?                   2) Insertion of SpaceOAR hydrogel  ? ?Surgeon: [name], MD ? ?Radiation oncologist: Tammi Klippel ? ?Anesthesia: MAC ? ?EBL: Minimal ? ?Complications: None ? ?Indication: Bobby Obrien is a 61 y.o. gentleman with clinically localized prostate cancer. After discussing management options for treatment, he elected to proceed with radiotherapy. He presents today for the above procedures. The potential risks, complications, alternative options, and expected recovery course have been discussed in detail with the patient and he has provided informed consent to proceed. ? ?Description of procedure: The patient was administered preoperative antibiotics, placed in the dorsal lithotomy position, and prepped and draped in the usual sterile fashion. Next, transrectal ultrasonography was utilized to visualize the prostate. Three gold fiducial markers were then placed into the prostate via transperineal needles under ultrasound guidance at the left apex, left base, and right mid gland under direct ultrasound guidance. A site in the midline was then selected on the perineum for placement of an 18 g needle with saline. The needle was advanced above the rectum and below Denonvillier's fascia to the mid gland and confirmed to be in the midline on transverse imaging. One cc of saline was injected confirming appropriate expansion of this space. A total of 5 cc of saline was then injected to open the space further bilaterally. The saline syringe was then removed and the SpaceOAR hydrogel was injected with good distribution bilaterally. He tolerated the procedure well and without complications. He was given a voiding trial prior to discharge from the PACU.  ?

## 2021-07-12 NOTE — Discharge Instructions (Addendum)
?  Post Anesthesia Home Care Instructions ? ?Activity: ?Get plenty of rest for the remainder of the day. A responsible individual must stay with you for 24 hours following the procedure.  ?For the next 24 hours, DO NOT: ?-Drive a car ?-Paediatric nurse ?-Drink alcoholic beverages ?-Take any medication unless instructed by your physician ?-Make any legal decisions or sign important papers. ? ?Meals: ?Start with liquid foods such as gelatin or soup. Progress to regular foods as tolerated. Avoid greasy, spicy, heavy foods. If nausea and/or vomiting occur, drink only clear liquids until the nausea and/or vomiting subsides. Call your physician if vomiting continues. ? ?Special Instructions/Symptoms: ?Your throat may feel dry or sore from the anesthesia or the breathing tube placed in your throat during surgery. If this causes discomfort, gargle with warm salt water. The discomfort should disappear within 24 hours. ? ? ? May take Tylenol beginning at 3 PM as needed for soreness or discomfort. ?

## 2021-07-13 ENCOUNTER — Encounter (HOSPITAL_BASED_OUTPATIENT_CLINIC_OR_DEPARTMENT_OTHER): Payer: Self-pay | Admitting: Urology

## 2021-07-14 ENCOUNTER — Telehealth: Payer: Self-pay | Admitting: *Deleted

## 2021-07-14 NOTE — Telephone Encounter (Signed)
CALLED PATIENT TO REMIND OF SIM APPT. FOR 07-18-21- ARRIVAL TIME- 8:45 AM @ [facility], PATIENT INFORMED TO ARRIVE WITH A FULL BLADDER AND AN EMPTY BOWEL, LVM FOR A RETURN CALL ?

## 2021-07-17 NOTE — Progress Notes (Signed)
?  Radiation Oncology         [phone] ?________________________________ ? ?Name: Bobby Obrien MRN: [id number]  ?Date: 07/18/2021  DOB: 10-03-1960 ? ?SIMULATION AND TREATMENT PLANNING NOTE ? ?  ICD-10-CM   ?1. Malignant neoplasm of prostate ([place])  C61   ?  ? ? ?DIAGNOSIS:  61 y.o. gentleman with Stage T1c adenocarcinoma of the prostate with Gleason score of 3+4, and PSA of 10.5. ? ?NARRATIVE:  The patient was brought to the [facility].  Identity was confirmed.  All relevant records and images related to the planned course of therapy were reviewed.  The patient freely provided informed written consent to proceed with treatment after reviewing the details related to the planned course of therapy. The consent form was witnessed and verified by the simulation staff.  Then, the patient was set-up in a stable reproducible supine position for radiation therapy.  A vacuum lock pillow device was custom fabricated to position his legs in a reproducible immobilized position.  Then, I performed a urethrogram under sterile conditions to identify the prostatic apex.  CT images were obtained.  Surface markings were placed.  The CT images were loaded into the planning software.  Then the prostate target and avoidance structures including the rectum, bladder, bowel and hips were contoured.  Treatment planning then occurred.  The radiation prescription was entered and confirmed.  A total of one complex treatment devices was fabricated. I have requested : Intensity Modulated Radiotherapy (IMRT) is medically necessary for this case for the following reason:  Rectal sparing.. ? ?PLAN:  The patient will receive 70 Gy in 28 fractions. ? ?________________________________ ? ?Sheral Apley Tammi Klippel, M.D. ? ?

## 2021-07-18 ENCOUNTER — Ambulatory Visit
Admission: RE | Admit: 2021-07-18 | Discharge: 2021-07-18 | Disposition: A | Discharge status: Home / Self Care | Payer: Self-pay | Source: Ambulatory Visit | Attending: Radiation Oncology | Admitting: Radiation Oncology

## 2021-07-18 ENCOUNTER — Other Ambulatory Visit: Payer: Self-pay

## 2021-07-18 DIAGNOSIS — Z510 Encounter for antineoplastic radiation therapy: Secondary | ICD-10-CM | POA: Insufficient documentation

## 2021-07-18 DIAGNOSIS — C61 Malignant neoplasm of prostate: Secondary | ICD-10-CM | POA: Insufficient documentation

## 2021-07-28 ENCOUNTER — Other Ambulatory Visit: Payer: Self-pay

## 2021-07-28 ENCOUNTER — Ambulatory Visit
Admission: RE | Admit: 2021-07-28 | Discharge: 2021-07-28 | Disposition: A | Discharge status: Home / Self Care | Payer: Self-pay | Source: Ambulatory Visit | Attending: Radiation Oncology | Admitting: Radiation Oncology

## 2021-07-29 ENCOUNTER — Ambulatory Visit
Admission: RE | Admit: 2021-07-29 | Discharge: 2021-07-29 | Disposition: A | Discharge status: Home / Self Care | Payer: Self-pay | Source: Ambulatory Visit | Attending: Radiation Oncology | Admitting: Radiation Oncology

## 2021-08-01 ENCOUNTER — Ambulatory Visit
Admission: RE | Admit: 2021-08-01 | Discharge: 2021-08-01 | Disposition: A | Discharge status: Home / Self Care | Payer: Self-pay | Source: Ambulatory Visit | Attending: Radiation Oncology | Admitting: Radiation Oncology

## 2021-08-02 ENCOUNTER — Ambulatory Visit
Admission: RE | Admit: 2021-08-02 | Discharge: 2021-08-02 | Disposition: A | Discharge status: Home / Self Care | Payer: Self-pay | Source: Ambulatory Visit | Attending: Radiation Oncology | Admitting: Radiation Oncology

## 2021-08-02 ENCOUNTER — Other Ambulatory Visit: Payer: Self-pay

## 2021-08-03 ENCOUNTER — Ambulatory Visit
Admission: RE | Admit: 2021-08-03 | Discharge: 2021-08-03 | Disposition: A | Discharge status: Home / Self Care | Payer: Self-pay | Source: Ambulatory Visit | Attending: Radiation Oncology | Admitting: Radiation Oncology

## 2021-08-03 ENCOUNTER — Other Ambulatory Visit: Payer: Self-pay | Admitting: Internal Medicine

## 2021-08-04 ENCOUNTER — Ambulatory Visit
Admission: RE | Admit: 2021-08-04 | Discharge: 2021-08-04 | Disposition: A | Discharge status: Home / Self Care | Payer: Self-pay | Source: Ambulatory Visit | Attending: Radiation Oncology | Admitting: Radiation Oncology

## 2021-08-04 ENCOUNTER — Other Ambulatory Visit: Payer: Self-pay

## 2021-08-05 ENCOUNTER — Ambulatory Visit
Admission: RE | Admit: 2021-08-05 | Discharge: 2021-08-05 | Disposition: A | Discharge status: Home / Self Care | Payer: Self-pay | Source: Ambulatory Visit | Attending: Radiation Oncology | Admitting: Radiation Oncology

## 2021-08-08 ENCOUNTER — Ambulatory Visit
Admission: RE | Admit: 2021-08-08 | Discharge: 2021-08-08 | Disposition: A | Discharge status: Home / Self Care | Payer: No Typology Code available for payment source | Source: Ambulatory Visit | Attending: Radiation Oncology | Admitting: Radiation Oncology

## 2021-08-08 ENCOUNTER — Encounter: Payer: Self-pay | Admitting: Family Medicine

## 2021-08-08 ENCOUNTER — Ambulatory Visit (INDEPENDENT_AMBULATORY_CARE_PROVIDER_SITE_OTHER): Payer: No Typology Code available for payment source | Admitting: Family Medicine

## 2021-08-08 VITALS — BP 98/64 | HR 89 | Temp 97.9°F | Wt 193.0 lb

## 2021-08-08 DIAGNOSIS — C61 Malignant neoplasm of prostate: Secondary | ICD-10-CM | POA: Insufficient documentation

## 2021-08-08 DIAGNOSIS — R490 Dysphonia: Secondary | ICD-10-CM

## 2021-08-08 DIAGNOSIS — Z510 Encounter for antineoplastic radiation therapy: Secondary | ICD-10-CM | POA: Insufficient documentation

## 2021-08-08 NOTE — Progress Notes (Signed)
? ?  Subjective:  ? ? Patient ID: Bobby Obrien, male    DOB: Jun 26, 1960, 61 y.o.   MRN: [id number] ? ?HPI ?Here for one month of a hoarse voice. No ST or fever or congestion. He feels like he has PND most of the time. No cough or SOB. 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 He rarely has heartburn.  ? ? ?Review of Systems  ?Constitutional: Negative.   ?HENT:  Positive for postnasal drip and voice change. Negative for congestion, rhinorrhea, sinus pressure and sore throat.   ?Eyes: Negative.   ?Respiratory: Negative.    ? ?   ?Objective:  ? Physical Exam ?Constitutional:   ?   Appearance: Normal appearance.  ?HENT:  ?   Right Ear: Tympanic membrane, ear canal and external ear normal.  ?   Left Ear: Tympanic membrane, ear canal and external ear normal.  ?   Nose: Nose normal.  ?   Mouth/Throat:  ?   Pharynx: Oropharynx is clear.  ?Eyes:  ?   Conjunctiva/sclera: Conjunctivae normal.  ?Cardiovascular:  ?   Rate and Rhythm: Normal rate and regular rhythm.  ?   Pulses: Normal pulses.  ?   Heart sounds: Normal heart sounds.  ?Pulmonary:  ?   Effort: Pulmonary effort is normal.  ?   Breath sounds: Normal breath sounds.  ?Lymphadenopathy:  ?   Cervical: No cervical adenopathy.  ?Neurological:  ?   Mental Status: He is alert.  ? ? ? ? ? ?   ?Assessment & Plan:  ?Hoarse voice. It is difficult to tell if this is the result of PND or night time GERD. We will attempt to treat both. I suggested he take Xyzal 5 mg in the mornings and Prilosec 20 mg in the evenings for a few weeks. Recheck as needed. ?Alysia Penna, MD ? ? ?

## 2021-08-09 ENCOUNTER — Ambulatory Visit
Admission: RE | Admit: 2021-08-09 | Discharge: 2021-08-09 | Disposition: A | Discharge status: Home / Self Care | Payer: No Typology Code available for payment source | Source: Ambulatory Visit | Attending: Radiation Oncology | Admitting: Radiation Oncology

## 2021-08-09 ENCOUNTER — Other Ambulatory Visit: Payer: Self-pay

## 2021-08-10 ENCOUNTER — Ambulatory Visit
Admission: RE | Admit: 2021-08-10 | Discharge: 2021-08-10 | Disposition: A | Discharge status: Home / Self Care | Payer: No Typology Code available for payment source | Source: Ambulatory Visit | Attending: Radiation Oncology | Admitting: Radiation Oncology

## 2021-08-11 ENCOUNTER — Ambulatory Visit
Admission: RE | Admit: 2021-08-11 | Discharge: 2021-08-11 | Disposition: A | Discharge status: Home / Self Care | Payer: No Typology Code available for payment source | Source: Ambulatory Visit | Attending: Radiation Oncology | Admitting: Radiation Oncology

## 2021-08-11 ENCOUNTER — Other Ambulatory Visit: Payer: Self-pay

## 2021-08-12 ENCOUNTER — Ambulatory Visit
Admission: RE | Admit: 2021-08-12 | Discharge: 2021-08-12 | Disposition: A | Discharge status: Home / Self Care | Payer: No Typology Code available for payment source | Source: Ambulatory Visit | Attending: Radiation Oncology | Admitting: Radiation Oncology

## 2021-08-15 ENCOUNTER — Ambulatory Visit
Admission: RE | Admit: 2021-08-15 | Discharge: 2021-08-15 | Disposition: A | Discharge status: Home / Self Care | Payer: No Typology Code available for payment source | Source: Ambulatory Visit | Attending: Radiation Oncology | Admitting: Radiation Oncology

## 2021-08-16 ENCOUNTER — Other Ambulatory Visit: Payer: Self-pay

## 2021-08-16 ENCOUNTER — Ambulatory Visit
Admission: RE | Admit: 2021-08-16 | Discharge: 2021-08-16 | Disposition: A | Discharge status: Home / Self Care | Payer: No Typology Code available for payment source | Source: Ambulatory Visit | Attending: Radiation Oncology | Admitting: Radiation Oncology

## 2021-08-17 ENCOUNTER — Other Ambulatory Visit: Payer: Self-pay | Admitting: *Deleted

## 2021-08-17 ENCOUNTER — Ambulatory Visit
Admission: RE | Admit: 2021-08-17 | Discharge: 2021-08-17 | Disposition: A | Discharge status: Home / Self Care | Payer: No Typology Code available for payment source | Source: Ambulatory Visit | Attending: Radiation Oncology | Admitting: Radiation Oncology

## 2021-08-17 MED ORDER — DAPAGLIFLOZIN PROPANEDIOL 5 MG PO TABS: 10.0000 mg | ORAL_TABLET | Freq: Every day | ORAL | 0 refills | Status: DC

## 2021-08-18 ENCOUNTER — Other Ambulatory Visit: Payer: Self-pay

## 2021-08-18 ENCOUNTER — Ambulatory Visit
Admission: RE | Admit: 2021-08-18 | Discharge: 2021-08-18 | Disposition: A | Discharge status: Home / Self Care | Payer: No Typology Code available for payment source | Source: Ambulatory Visit | Attending: Radiation Oncology | Admitting: Radiation Oncology

## 2021-08-19 ENCOUNTER — Ambulatory Visit
Admission: RE | Admit: 2021-08-19 | Discharge: 2021-08-19 | Disposition: A | Discharge status: Home / Self Care | Payer: No Typology Code available for payment source | Source: Ambulatory Visit | Attending: Radiation Oncology | Admitting: Radiation Oncology

## 2021-08-22 ENCOUNTER — Ambulatory Visit
Admission: RE | Admit: 2021-08-22 | Discharge: 2021-08-22 | Disposition: A | Discharge status: Home / Self Care | Payer: No Typology Code available for payment source | Source: Ambulatory Visit | Attending: Radiation Oncology | Admitting: Radiation Oncology

## 2021-08-22 ENCOUNTER — Other Ambulatory Visit: Payer: Self-pay

## 2021-08-23 ENCOUNTER — Other Ambulatory Visit: Payer: Self-pay

## 2021-08-23 ENCOUNTER — Ambulatory Visit
Admission: RE | Admit: 2021-08-23 | Discharge: 2021-08-23 | Disposition: A | Discharge status: Home / Self Care | Payer: No Typology Code available for payment source | Source: Ambulatory Visit | Attending: Radiation Oncology | Admitting: Radiation Oncology

## 2021-08-23 LAB — RAD ONC ARIA SESSION SUMMARY
Course Elapsed Days: 26
Plan Fractions Treated to Date: 19
Plan Prescribed Dose Per Fraction: 2.5 Gy
Plan Total Fractions Prescribed: 28
Plan Total Prescribed Dose: 70 Gy
Reference Point Dosage Given to Date: 47.5 Gy
Reference Point Session Dosage Given: 2.5 Gy
Session Number: 19

## 2021-08-24 ENCOUNTER — Ambulatory Visit
Admission: RE | Admit: 2021-08-24 | Discharge: 2021-08-24 | Disposition: A | Discharge status: Home / Self Care | Payer: No Typology Code available for payment source | Source: Ambulatory Visit | Attending: Radiation Oncology | Admitting: Radiation Oncology

## 2021-08-24 ENCOUNTER — Other Ambulatory Visit: Payer: Self-pay

## 2021-08-24 LAB — RAD ONC ARIA SESSION SUMMARY
Course Elapsed Days: 27
Plan Fractions Treated to Date: 20
Plan Prescribed Dose Per Fraction: 2.5 Gy
Plan Total Fractions Prescribed: 28
Plan Total Prescribed Dose: 70 Gy
Reference Point Dosage Given to Date: 50 Gy
Reference Point Session Dosage Given: 2.5 Gy
Session Number: 20

## 2021-08-25 ENCOUNTER — Ambulatory Visit
Admission: RE | Admit: 2021-08-25 | Discharge: 2021-08-25 | Disposition: A | Discharge status: Home / Self Care | Payer: No Typology Code available for payment source | Source: Ambulatory Visit | Attending: Radiation Oncology | Admitting: Radiation Oncology

## 2021-08-25 ENCOUNTER — Other Ambulatory Visit: Payer: Self-pay

## 2021-08-25 LAB — RAD ONC ARIA SESSION SUMMARY
Course Elapsed Days: 28
Plan Fractions Treated to Date: 21
Plan Prescribed Dose Per Fraction: 2.5 Gy
Plan Total Fractions Prescribed: 28
Plan Total Prescribed Dose: 70 Gy
Reference Point Dosage Given to Date: 52.5 Gy
Reference Point Session Dosage Given: 2.5 Gy
Session Number: 21

## 2021-08-26 ENCOUNTER — Other Ambulatory Visit: Payer: Self-pay

## 2021-08-26 ENCOUNTER — Ambulatory Visit
Admission: RE | Admit: 2021-08-26 | Discharge: 2021-08-26 | Disposition: A | Discharge status: Home / Self Care | Payer: No Typology Code available for payment source | Source: Ambulatory Visit | Attending: Radiation Oncology | Admitting: Radiation Oncology

## 2021-08-26 LAB — RAD ONC ARIA SESSION SUMMARY
Course Elapsed Days: 29
Plan Fractions Treated to Date: 22
Plan Prescribed Dose Per Fraction: 2.5 Gy
Plan Total Fractions Prescribed: 28
Plan Total Prescribed Dose: 70 Gy
Reference Point Dosage Given to Date: 55 Gy
Reference Point Session Dosage Given: 2.5 Gy
Session Number: 22

## 2021-08-29 ENCOUNTER — Ambulatory Visit
Admission: RE | Admit: 2021-08-29 | Discharge: 2021-08-29 | Disposition: A | Discharge status: Home / Self Care | Payer: No Typology Code available for payment source | Source: Ambulatory Visit | Attending: Radiation Oncology | Admitting: Radiation Oncology

## 2021-08-29 ENCOUNTER — Other Ambulatory Visit: Payer: Self-pay

## 2021-08-29 LAB — RAD ONC ARIA SESSION SUMMARY
Course Elapsed Days: 32
Plan Fractions Treated to Date: 23
Plan Prescribed Dose Per Fraction: 2.5 Gy
Plan Total Fractions Prescribed: 28
Plan Total Prescribed Dose: 70 Gy
Reference Point Dosage Given to Date: 57.5 Gy
Reference Point Session Dosage Given: 2.5 Gy
Session Number: 23

## 2021-08-30 ENCOUNTER — Ambulatory Visit
Admission: RE | Admit: 2021-08-30 | Discharge: 2021-08-30 | Disposition: A | Discharge status: Home / Self Care | Payer: No Typology Code available for payment source | Source: Ambulatory Visit | Attending: Radiation Oncology | Admitting: Radiation Oncology

## 2021-08-30 ENCOUNTER — Other Ambulatory Visit: Payer: Self-pay

## 2021-08-30 LAB — RAD ONC ARIA SESSION SUMMARY
Course Elapsed Days: 33
Plan Fractions Treated to Date: 24
Plan Prescribed Dose Per Fraction: 2.5 Gy
Plan Total Fractions Prescribed: 28
Plan Total Prescribed Dose: 70 Gy
Reference Point Dosage Given to Date: 60 Gy
Reference Point Session Dosage Given: 2.5 Gy
Session Number: 24

## 2021-08-31 ENCOUNTER — Ambulatory Visit
Admission: RE | Admit: 2021-08-31 | Discharge: 2021-08-31 | Disposition: A | Discharge status: Home / Self Care | Payer: No Typology Code available for payment source | Source: Ambulatory Visit | Attending: Radiation Oncology | Admitting: Radiation Oncology

## 2021-08-31 ENCOUNTER — Other Ambulatory Visit: Payer: Self-pay

## 2021-08-31 LAB — RAD ONC ARIA SESSION SUMMARY
Course Elapsed Days: 34
Plan Fractions Treated to Date: 25
Plan Prescribed Dose Per Fraction: 2.5 Gy
Plan Total Fractions Prescribed: 28
Plan Total Prescribed Dose: 70 Gy
Reference Point Dosage Given to Date: 62.5 Gy
Reference Point Session Dosage Given: 2.5 Gy
Session Number: 25

## 2021-09-01 ENCOUNTER — Other Ambulatory Visit: Payer: Self-pay

## 2021-09-01 ENCOUNTER — Ambulatory Visit
Admission: RE | Admit: 2021-09-01 | Discharge: 2021-09-01 | Disposition: A | Discharge status: Home / Self Care | Payer: No Typology Code available for payment source | Source: Ambulatory Visit | Attending: Radiation Oncology | Admitting: Radiation Oncology

## 2021-09-01 LAB — RAD ONC ARIA SESSION SUMMARY
Course Elapsed Days: 35
Plan Fractions Treated to Date: 26
Plan Prescribed Dose Per Fraction: 2.5 Gy
Plan Total Fractions Prescribed: 28
Plan Total Prescribed Dose: 70 Gy
Reference Point Dosage Given to Date: 65 Gy
Reference Point Session Dosage Given: 2.5 Gy
Session Number: 26

## 2021-09-02 ENCOUNTER — Other Ambulatory Visit: Payer: Self-pay

## 2021-09-02 ENCOUNTER — Ambulatory Visit
Admission: RE | Admit: 2021-09-02 | Discharge: 2021-09-02 | Disposition: A | Discharge status: Home / Self Care | Payer: No Typology Code available for payment source | Source: Ambulatory Visit | Attending: Radiation Oncology | Admitting: Radiation Oncology

## 2021-09-02 LAB — RAD ONC ARIA SESSION SUMMARY
Course Elapsed Days: 36
Plan Fractions Treated to Date: 27
Plan Prescribed Dose Per Fraction: 2.5 Gy
Plan Total Fractions Prescribed: 28
Plan Total Prescribed Dose: 70 Gy
Reference Point Dosage Given to Date: 67.5 Gy
Reference Point Session Dosage Given: 2.5 Gy
Session Number: 27

## 2021-09-05 ENCOUNTER — Ambulatory Visit
Admission: RE | Admit: 2021-09-05 | Discharge: 2021-09-05 | Disposition: A | Discharge status: Home / Self Care | Payer: No Typology Code available for payment source | Source: Ambulatory Visit | Attending: Radiation Oncology | Admitting: Radiation Oncology

## 2021-09-05 ENCOUNTER — Other Ambulatory Visit: Payer: Self-pay

## 2021-09-05 ENCOUNTER — Encounter: Payer: Self-pay | Admitting: Urology

## 2021-09-05 DIAGNOSIS — C61 Malignant neoplasm of prostate: Secondary | ICD-10-CM | POA: Insufficient documentation

## 2021-09-05 DIAGNOSIS — Z510 Encounter for antineoplastic radiation therapy: Secondary | ICD-10-CM | POA: Insufficient documentation

## 2021-09-05 LAB — RAD ONC ARIA SESSION SUMMARY
Course Elapsed Days: 39
Plan Fractions Treated to Date: 28
Plan Prescribed Dose Per Fraction: 2.5 Gy
Plan Total Fractions Prescribed: 28
Plan Total Prescribed Dose: 70 Gy
Reference Point Dosage Given to Date: 70 Gy
Reference Point Session Dosage Given: 2.5 Gy
Session Number: 28

## 2021-09-14 ENCOUNTER — Telehealth: Payer: Self-pay | Admitting: Pharmacist

## 2021-09-14 NOTE — Telephone Encounter (Signed)
Patient called as he will be out of the [place] as of tomorrow. Set aside another month's worth of samples for him to pick up. Called [place] [facility] and they did not receive his proof of address or documentation of no income statement. Printed out the form for him to fill out and set it with his Farxiga samples. Plan to fax to [facility] as soon as I receive it. ?

## 2021-09-19 ENCOUNTER — Other Ambulatory Visit: Payer: Self-pay

## 2021-09-19 MED ORDER — DAPAGLIFLOZIN PROPANEDIOL 10 MG PO TABS: 10.0000 mg | ORAL_TABLET | Freq: Every day | ORAL | 0 refills | Status: DC

## 2021-09-29 ENCOUNTER — Institutional Professional Consult (permissible substitution): Payer: No Typology Code available for payment source | Admitting: Cardiology

## 2021-10-05 ENCOUNTER — Encounter: Payer: Self-pay | Admitting: Urology

## 2021-10-05 NOTE — Progress Notes (Signed)
Telephone appointment. I verified patient's identity and began nursing interview. Patient reports hematuria x1 wk w/o pain. No other issues reported at this time.  Meaningful use complete. I-PSS score of 2-mild No urinary management medications. Urology appointment- None  Reminded patient of his 9:00am-10/06/21 telephone appointment w/ Ashlyn Bruning PA-C. I left my extension [phone] in case patient needs anything. Patient verbalized understanding.  Patient preferred contact [phone]

## 2021-10-05 NOTE — Progress Notes (Signed)
Radiation Oncology         [phone] ________________________________  Name: Bobby Obrien MRN: [id number]  Date: 10/06/2021  DOB: Feb 24, 1961  Post Treatment Note  CC: Panosh, Standley Brooking, MD  Raynelle Bring, MD  Diagnosis:   61 y.o. gentleman with Stage T1c adenocarcinoma of the prostate with Gleason score of 3+4, and PSA of 10.5.  Interval Since Last Radiation:  4.5 weeks  07/28/21 - 09/05/21:  The prostate was treated to 70 Gy in 28 fractions of 2.5 Gy  Narrative: I spoke with the patient to conduct his routine scheduled 1 month follow up visit via telephone to spare the patient unnecessary potential exposure in the healthcare setting during the current COVID-19 pandemic.  The patient was notified in advance and gave permission to proceed with this visit format.  He tolerated radiation treatment relatively well with only minor urinary irritation and modest fatigue.  He did report some mild dysuria, increased frequency, urgency and nocturia 3-4 times per night.  He denied abdominal pain, nausea, vomiting or diarrhea but did note some mild constipation.                              On review of systems, the patient states that he is doing very well in general.  He feels that his LUTS are pretty much back to his baseline regarding any frequency, urgency or nocturia and he specifically denies dysuria, straining to void, incomplete bladder emptying or incontinence.  He has noticed blood in his urine over the past 5 days, pretty consistently with each void but does notice that this improves when he increases his fluids.  He has passed very small clots but has not had any issue with a weak stream or difficulty emptying his bladder.  He denies any recent fevers, chills or night sweats.  He reports a healthy appetite and is maintaining his weight.  He denies abdominal pain, nausea, vomiting, diarrhea or constipation.  His energy level has also significantly improved and overall, he is quite pleased with his  progress to date.  ALLERGIES:  is allergic to crestor [rosuvastatin calcium], lipitor [atorvastatin], and zocor [simvastatin].  Meds: Current Outpatient Medications  Medication Sig Dispense Refill   aspirin 81 MG tablet Take 81 mg by mouth daily.     dapagliflozin propanediol (FARXIGA) 10 MG TABS tablet Take 1 tablet (10 mg total) by mouth daily before breakfast. 28 tablet 0   dapagliflozin propanediol (FARXIGA) 5 MG TABS tablet Take 2 tablets (10 mg total) by mouth daily before breakfast. 28 tablet 0   lisinopril-hydrochlorothiazide (ZESTORETIC) 20-25 MG tablet TAKE ONE TABLET BY MOUTH DAILY (Patient taking differently: Take 0.5 tablets by mouth every other day. Takes half (1/2) a tablet every other day) 90 tablet 0   No current facility-administered medications for this encounter.   Facility-Administered Medications Ordered in Other Encounters  Medication Dose Route Frequency Provider Last Rate Last Admin   sodium phosphate (FLEET) 7-19 GM/118ML enema 1 enema  1 enema Rectal Once [name], MD        Physical Findings:  vitals were not taken for this visit.  Pain Assessment Pain Score: 0-No pain/10 Unable to assess due to telephone follow-up visit format.  Lab Findings: Lab Results  Component Value Date   WBC 12.3 (H) 12/26/2020   HGB 19.0 (H) 07/12/2021   HCT 56.0 (H) 07/12/2021   MCV 94.7 12/26/2020   PLT 350 12/26/2020  Radiographic Findings: No results found.  Impression/Plan: 1. 61 y.o. gentleman with Stage T1c adenocarcinoma of the prostate with Gleason score of 3+4, and PSA of 10.5. He will continue to follow up with urology for ongoing PSA determinations but does not currently have any scheduled follow-up appointments with Dr. Jeffie Pollock to his knowledge. He understands what to expect with regards to PSA monitoring going forward.  I also encouraged him to continue pushing fluids to prevent the formation of clots secondary to the mild hematuria that he is  experiencing and to contact the urology office immediately if this worsens or he develops other urinary symptoms that might be suggestive of UTI.  I do not suspect that this is the case currently.  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  I will look forward to following his response to treatment via correspondence with urology, and would be happy to continue to participate in his care if clinically indicated. I talked to the patient about what to expect in the future, including his risk for erectile dysfunction and rectal bleeding. I encouraged him to call or return to the office if he has any questions regarding his previous radiation or possible radiation side effects. He was comfortable with this plan and will follow up as needed.     Nicholos Johns, PA-C

## 2021-10-05 NOTE — Progress Notes (Signed)
  Radiation Oncology         [phone] ________________________________  Name: Bobby Obrien MRN: [id number]  Date: 09/05/2021  DOB: [birth year]  End of Treatment Note  Diagnosis:   61 y.o. gentleman with Stage T1c adenocarcinoma of the prostate with Gleason score of 3+4, and PSA of 10.5.     Indication for treatment:  Curative, Definitive Radiotherapy       Radiation treatment dates:   07/28/21 - 09/05/21  Site/dose:   The prostate was treated to 70 Gy in 28 fractions of 2.5 Gy  Beams/energy:   The patient was treated with IMRT using volumetric arc therapy delivering 6 MV X-rays to clockwise and counterclockwise circumferential arcs with a 90 degree collimator offset to avoid dose scalloping.  Image guidance was performed with daily cone beam CT prior to each fraction to align to gold markers in the prostate and assure proper bladder and rectal fill volumes.  Immobilization was achieved with [employer] custom mold.  Narrative: The patient tolerated radiation treatment relatively well with only minor urinary irritation and modest fatigue.  He did report some mild dysuria, increased frequency, urgency and nocturia 3-4 times per night.  He denied abdominal pain, nausea, vomiting or diarrhea but did note some mild constipation.  Plan: The patient has completed radiation treatment. He will return to radiation oncology clinic for routine followup in one month. I advised him to call or return sooner if he has any questions or concerns related to his recovery or treatment. ________________________________  Sheral Apley. Tammi Klippel, M.D.

## 2021-10-06 ENCOUNTER — Ambulatory Visit
Admission: RE | Admit: 2021-10-06 | Discharge: 2021-10-06 | Disposition: A | Discharge status: Home / Self Care | Payer: Self-pay | Source: Ambulatory Visit | Attending: Urology | Admitting: Urology

## 2021-10-06 DIAGNOSIS — C61 Malignant neoplasm of prostate: Secondary | ICD-10-CM

## 2021-10-07 ENCOUNTER — Encounter: Payer: Self-pay | Admitting: Internal Medicine

## 2021-10-11 ENCOUNTER — Telehealth: Payer: Self-pay | Admitting: Internal Medicine

## 2021-10-11 ENCOUNTER — Other Ambulatory Visit (HOSPITAL_COMMUNITY): Payer: No Typology Code available for payment source

## 2021-10-11 NOTE — Telephone Encounter (Signed)
Pt is going out of town starting this Sunday and needs a refill on Farxiga.  Patient states he is supposed to be getting it from a company through [place] [facility].  Patient is coming by today (Tuesday) for blood work and wants to pick up samples while he is in the office.

## 2021-10-14 ENCOUNTER — Other Ambulatory Visit: Payer: Self-pay

## 2021-10-14 ENCOUNTER — Other Ambulatory Visit: Payer: Self-pay | Admitting: Urology

## 2021-10-14 DIAGNOSIS — R310 Gross hematuria: Secondary | ICD-10-CM

## 2021-10-14 MED ORDER — DAPAGLIFLOZIN PROPANEDIOL 10 MG PO TABS
10.0000 mg | ORAL_TABLET | Freq: Every day | ORAL | 0 refills | Status: DC
Start: 2021-10-14 — End: 2022-01-06

## 2021-10-17 ENCOUNTER — Ambulatory Visit: Payer: No Typology Code available for payment source | Admitting: Internal Medicine

## 2021-10-19 ENCOUNTER — Ambulatory Visit: Payer: No Typology Code available for payment source | Admitting: Internal Medicine

## 2021-10-19 NOTE — Progress Notes (Deleted)
Cardiology Office Note:    Date:  10/19/2021   ID:  Atilano Median, DOB 1960/08/31, MRN [id number]  PCP:  Burnis Medin, MD   [facility] Providers Cardiologist:  Janina Mayo, MD     Referring MD: Burnis Medin, MD   No chief complaint on file. SVT  History of Present Illness:    Bobby Obrien is a 61 y.o. 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 Rate 213 bpm 12/29/2020. He notes that his heart racing has increased thought associated with metformin.  He has not taken metoprolol daily.   He reports hx of palpations for 5-6 years. He had normal nuclear stress 2006 that was normal. No catheterization. He has no angina or dyspnea on exertion. He can do > 4 mets without any issues. He can run without symptoms. He has a healthy diet, except he eats butter and does have etoh.  Prior blood pressures have been well controlled. He takes half lisinopril-HCTz. Stated full pill let to low blood pressures  He was on statins and his legs were cramping. And he stopped this.    He notes a PE around 2006. The EMR notes CHF hx ?2008, he has not documentation of this. He reports no hx of orthopnea.  Has was diagnosed with prostate cancer, saw Dr. Tammi Klippel to discuss radiation therapy. He is planned for 5.5 weeks of external beam therapy. He is planned for Gold seed implants with Dr. Milford Cage. Due to plan for cardiology FU after his SVT event, referral was sent for preoperative cardiac risk assessment.   Family Hx- father had MI at 23. Mother- dementia, no heart disease, htn.    Social Hx: no smoking. 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   The 10-year ASCVD risk score (Arnett DK, et al., 2019) is: 17.1%   Values used to calculate the score:     Age:  21 years     Sex: Male     Is Non-Hispanic African American: Yes     Diabetic: Yes     Tobacco smoker: No     Systolic Blood Pressure: 98 mmHg     Is BP treated: Yes     HDL Cholesterol: 38.9 mg/dL     Total Cholesterol: 184 mg/dL    Cardiology Studies Nuclear 2006- normal Echo 2005- noted EF 50-55  Past Medical History:  Diagnosis Date   BPH associated with nocturia    History of asthma    childhood   History of CHF (congestive heart failure) 04/2004   in setting PE   History of pulmonary embolism 04/2004   with HF and hypertension;  bilateral lower lobe,  completed coumadin   (07-08-2021  pt stated never had clots before and none since, pt had negative work-up for blood disorder at that time)   Hyperlipidemia    Hypertension    followed by pcp   (per cardiology note in epic 0302-2023 nuclear stress test 07-27-2004 normal  and echo 07-27-2005 (ef 50-55%)   Malignant neoplasm prostate ([place]) 01/2021   urologist--- dr wrenn/  radiation oncology-- dr Tammi Klippel;    dx 09/ 2022  Gleason 3+4, PSA 10.5   Panic disorder    Paroxysmal SVT (supraventricular tachycardia) ([place])  nonsustained;   ED visit 12-26-2020  SVT heart rate 2013 x2 doses adenosine;  followed up with pcp was referred to cardiology at the time but pt had no visit;  pt needed pcp clearance prior to surgery on 07-08-2021 and was referred to cardiology, office visit w/ cardiologist-- dr Stanton Kidney Latiana Tomei 07-07-2021, was cleared for surgery, pt to started taking toprol   Tachycardia    Type 2 diabetes mellitus ([place])    followed by pcp   (03-03-2023n pt stated checks blood sugar once daily , fasting (130s) and not fasting (165-170)   Wears partial dentures    upper    Past Surgical History:  Procedure Laterality Date   GOLD SEED IMPLANT N/A 07/12/2021   Procedure: GOLD SEED IMPLANT;  Surgeon: [name], MD;  Location[facility];  Service: Urology;  Laterality: N/A;  30 MINS FOR CASE   NO PAST SURGERIES      SPACE OAR INSTILLATION N/A 07/12/2021   Procedure: SPACE OAR INSTILLATION;  Surgeon: [name], MD;  Location: [facility];  Service: Urology;  Laterality: N/A;    Current Medications: No outpatient medications have been marked as taking for the 10/19/21 encounter (Appointment) with Janina Mayo, MD.     Allergies:   Crestor [rosuvastatin calcium], Lipitor [atorvastatin], and Zocor [simvastatin]   Social History   Socioeconomic History   Marital status: Married    Spouse name: Not on file   Number of children: Not on file   Years of education: Not on file   Highest education level: Not on file  Occupational History   Not on file  Tobacco Use   Smoking status: Never   Smokeless tobacco: Never  Vaping Use   Vaping Use: Never used  Substance and Sexual Activity   Alcohol use: Yes    Comment: occasional   Drug use: Never   Sexual activity: Not on file  Other Topics Concern   Not on file  Social History Narrative   HH of 2 married    Musician   Working evening arenas    [place]    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          Social Determinants of Health   Financial Resource Strain: Not on file  Food Insecurity: Not on file  Transportation Needs: Not on file  Physical Activity: Not on file  Stress: Not on file  Social Connections: Not on file     Family History: The patient's family history includes Heart attack in his father; Hypertension in his father and mother; Kidney disease in his mother.  ROS:   Please see the history of present illness.     All other systems reviewed and are negative.  EKGs/Labs/Other Studies Reviewed:    The following studies were reviewed today:   EKG:  EKG is  ordered today.  The ekg ordered today demonstrates   NSR, LAFB, septal q (seen 12/26/2020)    Recent Labs: 12/26/2020: Platelets 350 07/12/2021: BUN 21; Creatinine, Ser 1.20; Hemoglobin 19.0; Potassium 4.3; Sodium 139  Recent Lipid Panel    Component Value  Date/Time   CHOL 184 07/06/2020 1003   TRIG 76.0 07/06/2020 1003   HDL 38.90 (L) 07/06/2020 1003   CHOLHDL 5 07/06/2020 1003   VLDL 15.2 07/06/2020 1003   LDLCALC 130 (H) 07/06/2020 1003   LDLDIRECT 161.0 03/19/2019 1554     Risk Assessment/Calculations:           Physical Exam:  VS:  There were no vitals taken for this visit.    Wt Readings from Last 3 Encounters:  08/08/21 193 lb (87.5 kg)  07/12/21 194 lb 14.4 oz (88.4 kg)  07/07/21 200 lb 6.4 oz (90.9 kg)     GEN:  Well nourished, well developed in no acute distress HEENT: Normal NECK: No JVD; No carotid bruits LYMPHATICS: No lymphadenopathy CARDIAC: RRR, no murmurs, rubs, gallops RESPIRATORY:  Clear to auscultation without rales, wheezing or rhonchi  ABDOMEN: Soft, non-tender, non-distended MUSCULOSKELETAL:  No edema; No deformity  SKIN: Warm and dry NEUROLOGIC:  Alert and oriented x 3 PSYCHIATRIC:  Normal affect   ASSESSMENT:    #Pre-op: He has acceptable cardiac risk for gold seed implant procedure and radiation for prostate CA. Will schedule his BB with hx of short RP tachycardia. He has no hx of ischemic dx or stroke. He can do > 4 METS; no ischemic evaluation needed.  SVT- Short RP tachycardia; Ddx includes AVNRT/AVRT. Will plan to schedule metoprolol and send referral to EP for consideration of EP study/ablation. Metformin not typically associated with SVT. - *** Ep referral - metop 25 mg XL daily -TTE -not done***  #CVD Risk mitigation: He will think about PCSK9 for statin intolerance. He described significant myalgias. LDL 130.  LDL goal at least < 100 mg/dL. He is unsure about doing the injections. For DM2, agree with farxiga. Will defer diabetes management to his PCP. A1c goal < 7%; he is near goal at 7.2.  - re-address PCSK9 on next visit  #HTN- well controlled  #CardioOnc- androgen deprivation therapy,  recommended for high risk prostate CA,  is associated with increased CV disease risk due to  hypogonadism. Will be important to continue CVD risk mitigate per above if he were to require this therapy in the future. Derald Macleod et al. How to Treat Prostate Cancer With Androgen Deprivation and Minimize Cardiovascular Risk: A Therapeutic Tightrope. [place]. Planned for 70 Gy for his prostate with Dr. Tammi Klippel  PLAN:    In order of problems listed above:  TTE Metoprolol 12.5 mg XL daily EP referral  Follow up 3 months- discuss PCSK9 inhibitor      Medication Adjustments/Labs and Tests Ordered: Current medicines are reviewed at length with the patient today.  Concerns regarding medicines are outlined above.  No orders of the defined types were placed in this encounter.  No orders of the defined types were placed in this encounter.   There are no Patient Instructions on file for this visit.   Signed, Janina Mayo, MD  10/19/2021 9:22 AM    [facility]

## 2021-10-20 ENCOUNTER — Telehealth: Payer: Self-pay | Admitting: Pharmacist

## 2021-10-20 NOTE — Telephone Encounter (Signed)
Called patient to follow up on [place] [employer] application. Patient has not yet heard back from the pharmacy. He also has not yet applied for Medicaid either. Provided him with the website to apply for Medicaid.  [place] [employer] still needs his spouse's proof of income and then they will process his application. Patient is aware and will fax it to the office.

## 2021-10-26 ENCOUNTER — Encounter: Payer: Self-pay | Admitting: Internal Medicine

## 2021-10-28 ENCOUNTER — Other Ambulatory Visit (HOSPITAL_COMMUNITY): Payer: No Typology Code available for payment source

## 2021-11-07 ENCOUNTER — Encounter (HOSPITAL_COMMUNITY): Payer: Self-pay

## 2021-11-11 ENCOUNTER — Telehealth: Payer: Self-pay

## 2021-11-11 ENCOUNTER — Other Ambulatory Visit: Payer: Self-pay

## 2021-11-11 MED ORDER — DAPAGLIFLOZIN PROPANEDIOL 5 MG PO TABS
5.0000 mg | ORAL_TABLET | Freq: Two times a day (BID) | ORAL | 0 refills | Status: DC
Start: 2021-11-11 — End: 2022-01-06

## 2021-11-11 NOTE — Telephone Encounter (Signed)
Application for [employer] is in folder  Last Ov 06/15/21 Please advise

## 2021-11-14 ENCOUNTER — Encounter: Payer: Self-pay | Admitting: *Deleted

## 2021-11-15 NOTE — Addendum Note (Signed)
Addended by: Viona Gilmore on: 11/15/2021 09:59 AM   Modules accepted: Orders

## 2021-11-18 ENCOUNTER — Telehealth (HOSPITAL_COMMUNITY): Payer: Self-pay | Admitting: Internal Medicine

## 2021-11-18 NOTE — Telephone Encounter (Signed)
Just an FYI. We have made several attempts to contact this patient including sending a letter to schedule or reschedule their echocardiogram. We will be removing the patient from the echo [place].   11/07/21 Letter sent via MyChart evd  10/31/21 [facility] to reschedule @ 2:14/LBW  10/25/2021 10:55 AM [id number], Keone Kamer B  Cancel Rsn: Patient (PT lvm he needed to reschedule)         Thank you

## 2021-11-18 NOTE — Telephone Encounter (Signed)
Noted  

## 2021-11-18 NOTE — Progress Notes (Signed)
I spoke with Magda Paganini at [employer], she states the 1099 form received is not a proper verification of income.  She states his current application is over 47 days old so he will need to reapply. They will need with the new application a 9169 income verification letter from social security.  Magda Paganini states the new application with a letter of instructions was mailed to the patient on 11/15/2021.

## 2021-11-21 ENCOUNTER — Other Ambulatory Visit: Payer: Self-pay

## 2021-11-21 ENCOUNTER — Telehealth: Payer: Self-pay | Admitting: Pharmacist

## 2021-11-21 MED ORDER — EMPAGLIFLOZIN 25 MG PO TABS: 25.0000 mg | ORAL_TABLET | Freq: Every day | ORAL | 0 refills | Status: DC

## 2021-11-21 NOTE — Telephone Encounter (Signed)
Called patient as he is out of [place] samples. Unable to provide Farxiga samples at this time as they are unavailable. Spoke with PCP  - plan to sample Jardiance 25 mg tablets. Set aside 1 month's supply for patient to pick up. Unable to reach the patient and unable to leave a voicemail to make him aware of the plan. Will try again later today.

## 2021-11-21 NOTE — Telephone Encounter (Signed)
Patient called back and is aware of the plan. He is picking up the Jardiance samples today and this will replace the [place] temporarily. Plan to switch back once samples arrive or Medicaid is active.

## 2022-01-06 ENCOUNTER — Telehealth: Payer: Self-pay | Admitting: Pharmacist

## 2022-01-06 MED ORDER — DAPAGLIFLOZIN PROPANEDIOL 10 MG PO TABS: 10.0000 mg | ORAL_TABLET | Freq: Every day | ORAL | 0 refills | Status: DC

## 2022-01-06 NOTE — Telephone Encounter (Signed)
Called patient as he called yesterday looking for samples. He has yet to hear from [facility]. Patient requested to switch back to [place] if possible. Set aside samples of Farxiga. Will removed Jardiance from medication list.

## 2022-02-02 ENCOUNTER — Telehealth: Payer: Self-pay | Admitting: Internal Medicine

## 2022-02-02 NOTE — Telephone Encounter (Signed)
Pt requesting samples of Wilder Glade, he ran out today and needs more.

## 2022-02-03 NOTE — Telephone Encounter (Signed)
Called patient to make him aware of the [place] samples. Unable to reach but left a detailed voicemail about picking up [place] samples today.

## 2022-02-07 ENCOUNTER — Other Ambulatory Visit: Payer: Self-pay | Admitting: *Deleted

## 2022-02-07 MED ORDER — DAPAGLIFLOZIN PROPANEDIOL 10 MG PO TABS: 10.0000 mg | ORAL_TABLET | Freq: Every day | ORAL | 0 refills | Status: DC

## 2022-03-07 ENCOUNTER — Other Ambulatory Visit: Payer: Self-pay | Admitting: *Deleted

## 2022-03-07 MED ORDER — DAPAGLIFLOZIN PROPANEDIOL 10 MG PO TABS: 10.0000 mg | ORAL_TABLET | Freq: Every day | ORAL | 0 refills | Status: DC

## 2022-03-27 ENCOUNTER — Telehealth: Payer: Self-pay | Admitting: Internal Medicine

## 2022-03-27 DIAGNOSIS — R972 Elevated prostate specific antigen [PSA]: Secondary | ICD-10-CM

## 2022-03-27 NOTE — Telephone Encounter (Signed)
Brought attention to Dr. Regis Bill. She is okay for patient doing lab here. But wonder why he is not doing at his urology place as they usually do.  Spoke to patient. He reports that his urology location doesn't take his orange card. That's why he is requesting to do it here.  And he is seeing [facility] with Dr. Jeffie Pollock.   Lab order is placed and pt was scheduled for lab appt for tomorrow.

## 2022-03-27 NOTE — Telephone Encounter (Signed)
Patient stopped by because he was seen at [facility] and they want him to do a PSA lab ASAP. I told patient that there was no orders from Dr.Panosh and so he could not do labs here. Patient wants to come back tomorrow to do labs.       Please advise

## 2022-03-28 ENCOUNTER — Other Ambulatory Visit (INDEPENDENT_AMBULATORY_CARE_PROVIDER_SITE_OTHER): Payer: No Typology Code available for payment source

## 2022-03-28 DIAGNOSIS — R972 Elevated prostate specific antigen [PSA]: Secondary | ICD-10-CM

## 2022-03-28 LAB — PSA: PSA: 1.83 ng/mL (ref 0.10–4.00)

## 2022-03-29 NOTE — Progress Notes (Signed)
Psa in normal range  forward to urologist  and tell patient result

## 2022-04-03 ENCOUNTER — Other Ambulatory Visit: Payer: Self-pay | Admitting: *Deleted

## 2022-04-03 MED ORDER — DAPAGLIFLOZIN PROPANEDIOL 10 MG PO TABS: 10.0000 mg | ORAL_TABLET | Freq: Every day | ORAL | 0 refills | Status: DC

## 2022-04-06 ENCOUNTER — Other Ambulatory Visit: Payer: Self-pay | Admitting: *Deleted

## 2022-04-06 MED ORDER — DAPAGLIFLOZIN PROPANEDIOL 10 MG PO TABS: 10.0000 mg | ORAL_TABLET | Freq: Every day | ORAL | 0 refills | Status: DC

## 2022-05-05 ENCOUNTER — Telehealth: Payer: Self-pay | Admitting: Internal Medicine

## 2022-05-05 NOTE — Telephone Encounter (Signed)
Pt stated that he spoke with Maddy and Maddy informed him to call the office and see if someone can set aside Farxiga '10MG'$  sample for him and call him once it is done so he can swing by and pick it up.   Please advise.

## 2022-05-05 NOTE — Telephone Encounter (Signed)
Spoke to patient. The sample is ready to be pick up by afternoon. Verbalized understanding.

## 2022-05-16 ENCOUNTER — Telehealth: Payer: Self-pay | Admitting: Internal Medicine

## 2022-05-16 NOTE — Telephone Encounter (Signed)
Requesting provider fill out paperwork for handicap placard

## 2022-05-22 NOTE — Telephone Encounter (Signed)
Pt called & aware that application has been completed/signed & is ready for pick up. Placed in file cabinet in front office.

## 2022-06-02 ENCOUNTER — Telehealth: Payer: Self-pay | Admitting: Internal Medicine

## 2022-06-02 NOTE — Telephone Encounter (Signed)
Pt is calling and would like more samples of dapagliflozin propanediol (FARXIGA) 10 MG TABS tablet . Pt is out of medication and he is waiting on patient assistant program

## 2022-06-05 ENCOUNTER — Telehealth: Payer: Self-pay | Admitting: Internal Medicine

## 2022-06-05 NOTE — Telephone Encounter (Signed)
Prescription Request  06/05/2022  Is this a "Controlled Substance" medicine? No  LOV: 06/15/2021  What is the name of the medication or equipment? Wilder Glade  Have you contacted your pharmacy to request a refill? No   Patient notified that their request is being sent to the clinical staff for review and that they should receive a response within 2 business days.   Please advise at Mobile [phone] (mobile)  Pt stated he need some samples  of Farxiga and want a call back he stated he got them from [facility].

## 2022-06-07 MED ORDER — DAPAGLIFLOZIN PROPANEDIOL 10 MG PO TABS: 10.0000 mg | ORAL_TABLET | Freq: Every day | ORAL | 0 refills | Status: DC

## 2022-06-07 NOTE — Telephone Encounter (Signed)
Pt is calling checking on samples

## 2022-06-07 NOTE — Telephone Encounter (Signed)
Samples available for pick up.  Patient is aware.

## 2022-06-28 NOTE — Telephone Encounter (Signed)
Pt call and stated he need a call back to talk about his medication and want some sample of farxiga.

## 2022-07-29 ENCOUNTER — Inpatient Hospital Stay (HOSPITAL_COMMUNITY)
Admission: EM | Admit: 2022-07-29 | Discharge: 2022-08-07 | DRG: 308 | Disposition: E | Payer: Medicaid Other | Attending: Pulmonary Disease | Admitting: Pulmonary Disease

## 2022-07-29 ENCOUNTER — Emergency Department (HOSPITAL_COMMUNITY): Payer: Medicaid Other

## 2022-07-29 ENCOUNTER — Inpatient Hospital Stay (HOSPITAL_COMMUNITY): Payer: Medicaid Other

## 2022-07-29 DIAGNOSIS — E785 Hyperlipidemia, unspecified: Secondary | ICD-10-CM | POA: Diagnosis present

## 2022-07-29 DIAGNOSIS — I110 Hypertensive heart disease with heart failure: Secondary | ICD-10-CM | POA: Diagnosis present

## 2022-07-29 DIAGNOSIS — I429 Cardiomyopathy, unspecified: Secondary | ICD-10-CM | POA: Diagnosis present

## 2022-07-29 DIAGNOSIS — N170 Acute kidney failure with tubular necrosis: Secondary | ICD-10-CM | POA: Diagnosis present

## 2022-07-29 DIAGNOSIS — I63431 Cerebral infarction due to embolism of right posterior cerebral artery: Secondary | ICD-10-CM | POA: Diagnosis not present

## 2022-07-29 DIAGNOSIS — Z515 Encounter for palliative care: Secondary | ICD-10-CM

## 2022-07-29 DIAGNOSIS — I469 Cardiac arrest, cause unspecified: Secondary | ICD-10-CM

## 2022-07-29 DIAGNOSIS — G931 Anoxic brain damage, not elsewhere classified: Secondary | ICD-10-CM | POA: Diagnosis not present

## 2022-07-29 DIAGNOSIS — Z1152 Encounter for screening for COVID-19: Secondary | ICD-10-CM | POA: Diagnosis not present

## 2022-07-29 DIAGNOSIS — K7200 Acute and subacute hepatic failure without coma: Secondary | ICD-10-CM | POA: Diagnosis present

## 2022-07-29 DIAGNOSIS — I472 Ventricular tachycardia, unspecified: Principal | ICD-10-CM | POA: Diagnosis present

## 2022-07-29 DIAGNOSIS — I639 Cerebral infarction, unspecified: Secondary | ICD-10-CM | POA: Diagnosis not present

## 2022-07-29 DIAGNOSIS — Z8249 Family history of ischemic heart disease and other diseases of the circulatory system: Secondary | ICD-10-CM

## 2022-07-29 DIAGNOSIS — Z7189 Other specified counseling: Secondary | ICD-10-CM | POA: Diagnosis not present

## 2022-07-29 DIAGNOSIS — J690 Pneumonitis due to inhalation of food and vomit: Secondary | ICD-10-CM | POA: Diagnosis present

## 2022-07-29 DIAGNOSIS — Z8546 Personal history of malignant neoplasm of prostate: Secondary | ICD-10-CM

## 2022-07-29 DIAGNOSIS — J9601 Acute respiratory failure with hypoxia: Secondary | ICD-10-CM

## 2022-07-29 DIAGNOSIS — I63132 Cerebral infarction due to embolism of left carotid artery: Secondary | ICD-10-CM | POA: Diagnosis not present

## 2022-07-29 DIAGNOSIS — E870 Hyperosmolality and hypernatremia: Secondary | ICD-10-CM | POA: Diagnosis not present

## 2022-07-29 DIAGNOSIS — I452 Bifascicular block: Secondary | ICD-10-CM | POA: Diagnosis present

## 2022-07-29 DIAGNOSIS — I959 Hypotension, unspecified: Secondary | ICD-10-CM | POA: Diagnosis present

## 2022-07-29 DIAGNOSIS — I340 Nonrheumatic mitral (valve) insufficiency: Secondary | ICD-10-CM | POA: Diagnosis present

## 2022-07-29 DIAGNOSIS — K5900 Constipation, unspecified: Secondary | ICD-10-CM | POA: Diagnosis not present

## 2022-07-29 DIAGNOSIS — Z888 Allergy status to other drugs, medicaments and biological substances status: Secondary | ICD-10-CM

## 2022-07-29 DIAGNOSIS — Z7982 Long term (current) use of aspirin: Secondary | ICD-10-CM

## 2022-07-29 DIAGNOSIS — G935 Compression of brain: Secondary | ICD-10-CM | POA: Diagnosis not present

## 2022-07-29 DIAGNOSIS — I4891 Unspecified atrial fibrillation: Secondary | ICD-10-CM | POA: Diagnosis present

## 2022-07-29 DIAGNOSIS — E1111 Type 2 diabetes mellitus with ketoacidosis with coma: Secondary | ICD-10-CM | POA: Diagnosis present

## 2022-07-29 DIAGNOSIS — R509 Fever, unspecified: Secondary | ICD-10-CM | POA: Insufficient documentation

## 2022-07-29 DIAGNOSIS — Z79899 Other long term (current) drug therapy: Secondary | ICD-10-CM

## 2022-07-29 DIAGNOSIS — N401 Enlarged prostate with lower urinary tract symptoms: Secondary | ICD-10-CM | POA: Diagnosis present

## 2022-07-29 DIAGNOSIS — R4182 Altered mental status, unspecified: Secondary | ICD-10-CM | POA: Diagnosis not present

## 2022-07-29 DIAGNOSIS — G9340 Encephalopathy, unspecified: Secondary | ICD-10-CM | POA: Insufficient documentation

## 2022-07-29 DIAGNOSIS — I462 Cardiac arrest due to underlying cardiac condition: Secondary | ICD-10-CM | POA: Diagnosis present

## 2022-07-29 DIAGNOSIS — R351 Nocturia: Secondary | ICD-10-CM | POA: Diagnosis present

## 2022-07-29 DIAGNOSIS — Z86718 Personal history of other venous thrombosis and embolism: Secondary | ICD-10-CM

## 2022-07-29 DIAGNOSIS — Z86711 Personal history of pulmonary embolism: Secondary | ICD-10-CM

## 2022-07-29 DIAGNOSIS — Z841 Family history of disorders of kidney and ureter: Secondary | ICD-10-CM

## 2022-07-29 DIAGNOSIS — N179 Acute kidney failure, unspecified: Secondary | ICD-10-CM | POA: Diagnosis not present

## 2022-07-29 DIAGNOSIS — Z7901 Long term (current) use of anticoagulants: Secondary | ICD-10-CM

## 2022-07-29 DIAGNOSIS — G936 Cerebral edema: Secondary | ICD-10-CM | POA: Diagnosis not present

## 2022-07-29 DIAGNOSIS — E1110 Type 2 diabetes mellitus with ketoacidosis without coma: Secondary | ICD-10-CM | POA: Insufficient documentation

## 2022-07-29 DIAGNOSIS — Z7984 Long term (current) use of oral hypoglycemic drugs: Secondary | ICD-10-CM

## 2022-07-29 DIAGNOSIS — Z66 Do not resuscitate: Secondary | ICD-10-CM | POA: Diagnosis not present

## 2022-07-29 DIAGNOSIS — R570 Cardiogenic shock: Secondary | ICD-10-CM | POA: Insufficient documentation

## 2022-07-29 DIAGNOSIS — E876 Hypokalemia: Secondary | ICD-10-CM | POA: Diagnosis present

## 2022-07-29 DIAGNOSIS — I63531 Cerebral infarction due to unspecified occlusion or stenosis of right posterior cerebral artery: Secondary | ICD-10-CM | POA: Insufficient documentation

## 2022-07-29 DIAGNOSIS — I5042 Chronic combined systolic (congestive) and diastolic (congestive) heart failure: Secondary | ICD-10-CM | POA: Diagnosis present

## 2022-07-29 DIAGNOSIS — I498 Other specified cardiac arrhythmias: Secondary | ICD-10-CM | POA: Diagnosis not present

## 2022-07-29 DIAGNOSIS — R579 Shock, unspecified: Secondary | ICD-10-CM | POA: Diagnosis not present

## 2022-07-29 LAB — HEPATIC FUNCTION PANEL
ALT: 478 U/L — ABNORMAL HIGH (ref 0–44)
AST: 411 U/L — ABNORMAL HIGH (ref 15–41)
Albumin: 2.7 g/dL — ABNORMAL LOW (ref 3.5–5.0)
Alkaline Phosphatase: 69 U/L (ref 38–126)
Bilirubin, Direct: 0.2 mg/dL (ref 0.0–0.2)
Indirect Bilirubin: 0.6 mg/dL (ref 0.3–0.9)
Total Bilirubin: 0.8 mg/dL (ref 0.3–1.2)
Total Protein: 5 g/dL — ABNORMAL LOW (ref 6.5–8.1)

## 2022-07-29 LAB — PROTIME-INR
INR: 1.6 — ABNORMAL HIGH (ref 0.8–1.2)
Prothrombin Time: 19.2 seconds — ABNORMAL HIGH (ref 11.4–15.2)

## 2022-07-29 LAB — I-STAT CHEM 8, ED
BUN: 16 mg/dL (ref 8–23)
Calcium, Ion: 1.08 mmol/L — ABNORMAL LOW (ref 1.15–1.40)
Chloride: 102 mmol/L (ref 98–111)
Creatinine, Ser: 1.7 mg/dL — ABNORMAL HIGH (ref 0.61–1.24)
Glucose, Bld: 406 mg/dL — ABNORMAL HIGH (ref 70–99)
HCT: 44 % (ref 39.0–52.0)
Hemoglobin: 15 g/dL (ref 13.0–17.0)
Potassium: 2.7 mmol/L — CL (ref 3.5–5.1)
Sodium: 137 mmol/L (ref 135–145)
TCO2: 21 mmol/L — ABNORMAL LOW (ref 22–32)

## 2022-07-29 LAB — CBC
HCT: 47.3 % (ref 39.0–52.0)
Hemoglobin: 14.4 g/dL (ref 13.0–17.0)
MCH: 31.6 pg (ref 26.0–34.0)
MCHC: 30.4 g/dL (ref 30.0–36.0)
MCV: 103.7 fL — ABNORMAL HIGH (ref 80.0–100.0)
Platelets: 154 10*3/uL (ref 150–400)
RBC: 4.56 MIL/uL (ref 4.22–5.81)
RDW: 11.8 % (ref 11.5–15.5)
WBC: 12.4 10*3/uL — ABNORMAL HIGH (ref 4.0–10.5)
nRBC: 0 % (ref 0.0–0.2)

## 2022-07-29 LAB — MAGNESIUM: Magnesium: 3.4 mg/dL — ABNORMAL HIGH (ref 1.7–2.4)

## 2022-07-29 LAB — BASIC METABOLIC PANEL
Anion gap: 19 — ABNORMAL HIGH (ref 5–15)
BUN: 13 mg/dL (ref 8–23)
CO2: 13 mmol/L — ABNORMAL LOW (ref 22–32)
Calcium: 8.5 mg/dL — ABNORMAL LOW (ref 8.9–10.3)
Chloride: 104 mmol/L (ref 98–111)
Creatinine, Ser: 1.77 mg/dL — ABNORMAL HIGH (ref 0.61–1.24)
GFR, Estimated: 43 mL/min — ABNORMAL LOW (ref >60)
Glucose, Bld: 410 mg/dL — ABNORMAL HIGH (ref 70–99)
Potassium: 2.8 mmol/L — ABNORMAL LOW (ref 3.5–5.1)
Sodium: 136 mmol/L (ref 135–145)

## 2022-07-29 LAB — BRAIN NATRIURETIC PEPTIDE: B Natriuretic Peptide: 77.8 pg/mL (ref 0.0–100.0)

## 2022-07-29 LAB — CBG MONITORING, ED: Glucose-Capillary: 395 mg/dL — ABNORMAL HIGH (ref 70–99)

## 2022-07-29 LAB — GLUCOSE, CAPILLARY: Glucose-Capillary: 458 mg/dL — ABNORMAL HIGH (ref 70–99)

## 2022-07-29 LAB — TROPONIN I (HIGH SENSITIVITY): Troponin I (High Sensitivity): 563 ng/L (ref <18)

## 2022-07-29 MED ORDER — PROPOFOL 1000 MG/100ML IV EMUL
INTRAVENOUS | Status: AC
  Filled 2022-07-29: qty 100

## 2022-07-29 MED ORDER — FENTANYL BOLUS VIA INFUSION
50.0000 ug | INTRAVENOUS | Status: DC | PRN
  Administered 2022-07-30 – 2022-08-01 (×7): 50 ug via INTRAVENOUS
  Administered 2022-08-02 (×2): 100 ug via INTRAVENOUS
  Administered 2022-08-02: 50 ug via INTRAVENOUS
  Administered 2022-08-02: 100 ug via INTRAVENOUS
  Administered 2022-08-03: 50 ug via INTRAVENOUS
  Administered 2022-08-03 (×2): 100 ug via INTRAVENOUS
  Administered 2022-08-03: 50 ug via INTRAVENOUS
  Administered 2022-08-04 – 2022-08-06 (×12): 100 ug via INTRAVENOUS

## 2022-07-29 MED ORDER — STERILE WATER FOR INJECTION IV SOLN
INTRAVENOUS | Status: DC
  Filled 2022-07-29 (×3): qty 1000

## 2022-07-29 MED ORDER — SODIUM BICARBONATE 8.4 % IV SOLN
INTRAVENOUS | Status: AC | PRN
  Administered 2022-07-29 (×3): 50 meq via INTRAVENOUS

## 2022-07-29 MED ORDER — SODIUM CHLORIDE 0.9 % IV SOLN
250.0000 mL | INTRAVENOUS | Status: DC
  Administered 2022-07-30 – 2022-08-03 (×2): 250 mL via INTRAVENOUS

## 2022-07-29 MED ORDER — SUCCINYLCHOLINE CHLORIDE 20 MG/ML IJ SOLN
INTRAMUSCULAR | Status: AC | PRN
  Administered 2022-07-29: 100 mg via INTRAVENOUS

## 2022-07-29 MED ORDER — IOHEXOL 350 MG/ML SOLN
75.0000 mL | Freq: Once | INTRAVENOUS | Status: AC | PRN
  Administered 2022-07-29: 75 mL via INTRAVENOUS

## 2022-07-29 MED ORDER — VASOPRESSIN 20 UNITS/100 ML INFUSION FOR SHOCK
0.0000 [IU]/min | INTRAVENOUS | Status: DC
  Administered 2022-07-29 – 2022-07-30 (×3): 0.03 [IU]/min via INTRAVENOUS
  Filled 2022-07-29 (×2): qty 100

## 2022-07-29 MED ORDER — EPINEPHRINE HCL 5 MG/250ML IV SOLN IN NS
0.5000 ug/min | INTRAVENOUS | Status: DC
  Administered 2022-07-29: 3 ug/min via INTRAVENOUS

## 2022-07-29 MED ORDER — FENTANYL 2500MCG IN NS 250ML (10MCG/ML) PREMIX INFUSION
50.0000 ug/h | INTRAVENOUS | Status: DC
  Administered 2022-07-29: 200 ug/h via INTRAVENOUS
  Administered 2022-07-30 – 2022-07-31 (×2): 150 ug/h via INTRAVENOUS
  Administered 2022-08-01 (×2): 50 ug/h via INTRAVENOUS
  Filled 2022-07-29 (×3): qty 250

## 2022-07-29 MED ORDER — FENTANYL CITRATE PF 50 MCG/ML IJ SOSY
100.0000 ug | PREFILLED_SYRINGE | Freq: Once | INTRAMUSCULAR | Status: AC
  Administered 2022-07-29: 100 ug via INTRAVENOUS

## 2022-07-29 MED ORDER — FENTANYL 2500MCG IN NS 250ML (10MCG/ML) PREMIX INFUSION
50.0000 ug/h | INTRAVENOUS | Status: DC
  Administered 2022-07-29: 100 ug/h via INTRAVENOUS
  Filled 2022-07-29: qty 250

## 2022-07-29 MED ORDER — HEPARIN SODIUM (PORCINE) 5000 UNIT/ML IJ SOLN
5000.0000 [IU] | Freq: Three times a day (TID) | INTRAMUSCULAR | Status: DC
  Administered 2022-07-30 – 2022-08-06 (×21): 5000 [IU] via SUBCUTANEOUS
  Filled 2022-07-29 (×21): qty 1

## 2022-07-29 MED ORDER — CALCIUM GLUCONATE-NACL 1-0.675 GM/50ML-% IV SOLN: 1.0000 g | Freq: Once | INTRAVENOUS | Status: DC

## 2022-07-29 MED ORDER — ETOMIDATE 2 MG/ML IV SOLN
INTRAVENOUS | Status: AC | PRN
  Administered 2022-07-29: 20 mg via INTRAVENOUS

## 2022-07-29 MED ORDER — FENTANYL CITRATE PF 50 MCG/ML IJ SOSY
PREFILLED_SYRINGE | INTRAMUSCULAR | Status: AC
  Filled 2022-07-29: qty 2

## 2022-07-29 MED ORDER — POLYETHYLENE GLYCOL 3350 17 G PO PACK
17.0000 g | PACK | Freq: Every day | ORAL | Status: DC
  Administered 2022-07-30 – 2022-08-01 (×3): 17 g
  Filled 2022-07-29 (×3): qty 1

## 2022-07-29 MED ORDER — PROPOFOL 1000 MG/100ML IV EMUL: 5.0000 ug/kg/min | INTRAVENOUS | Status: DC

## 2022-07-29 MED ORDER — DOCUSATE SODIUM 50 MG/5ML PO LIQD
100.0000 mg | Freq: Two times a day (BID) | ORAL | Status: DC | PRN
  Administered 2022-07-30 – 2022-08-04 (×2): 100 mg

## 2022-07-29 MED ORDER — INSULIN ASPART 100 UNIT/ML IJ SOLN: 0.0000 [IU] | INTRAMUSCULAR | Status: DC

## 2022-07-29 MED ORDER — PROPOFOL 1000 MG/100ML IV EMUL
0.0000 ug/kg/min | INTRAVENOUS | Status: DC
  Administered 2022-07-29: 20 ug/kg/min via INTRAVENOUS

## 2022-07-29 MED ORDER — POTASSIUM CHLORIDE 20 MEQ PO PACK
60.0000 meq | PACK | Freq: Once | ORAL | Status: AC
  Administered 2022-07-29: 60 meq
  Filled 2022-07-29: qty 3

## 2022-07-29 MED ORDER — CHLORHEXIDINE GLUCONATE CLOTH 2 % EX PADS
6.0000 | MEDICATED_PAD | Freq: Every day | CUTANEOUS | Status: DC
  Administered 2022-07-29 – 2022-08-05 (×7): 6 via TOPICAL

## 2022-07-29 MED ORDER — SODIUM CHLORIDE 0.9 % IV SOLN
3.0000 g | Freq: Three times a day (TID) | INTRAVENOUS | Status: AC
  Administered 2022-07-30 – 2022-08-03 (×15): 3 g via INTRAVENOUS
  Filled 2022-07-29 (×15): qty 8

## 2022-07-29 MED ORDER — CALCIUM CHLORIDE 10 % IV SOLN
INTRAVENOUS | Status: AC | PRN
  Administered 2022-07-29: 1 g via INTRAVENOUS

## 2022-07-29 MED ORDER — SODIUM BICARBONATE 8.4 % IV SOLN
INTRAVENOUS | Status: DC
  Filled 2022-07-29 (×2): qty 1000

## 2022-07-29 MED ORDER — POTASSIUM CHLORIDE 10 MEQ/100ML IV SOLN: 10.0000 meq | INTRAVENOUS | Status: DC

## 2022-07-29 MED ORDER — NOREPINEPHRINE 4 MG/250ML-% IV SOLN
0.0000 ug/min | INTRAVENOUS | Status: DC
  Administered 2022-07-29: 20 ug/min via INTRAVENOUS
  Administered 2022-07-30: 25 ug/min via INTRAVENOUS
  Administered 2022-07-30: 21 ug/min via INTRAVENOUS
  Administered 2022-07-30: 26 ug/min via INTRAVENOUS
  Filled 2022-07-29 (×4): qty 250

## 2022-07-29 MED ORDER — DOCUSATE SODIUM 50 MG/5ML PO LIQD
100.0000 mg | Freq: Two times a day (BID) | ORAL | Status: DC
  Administered 2022-07-30 – 2022-08-06 (×14): 100 mg
  Filled 2022-07-29 (×16): qty 10

## 2022-07-29 MED ORDER — PROPOFOL 1000 MG/100ML IV EMUL
5.0000 ug/kg/min | INTRAVENOUS | Status: DC
  Administered 2022-07-29: 20 ug/kg/min via INTRAVENOUS
  Administered 2022-07-29: 10 ug/kg/min via INTRAVENOUS
  Administered 2022-07-29 (×3): 20 ug/kg/min via INTRAVENOUS

## 2022-07-29 MED ORDER — PROPOFOL 1000 MG/100ML IV EMUL: 0.0000 ug/kg/min | INTRAVENOUS | Status: DC

## 2022-07-29 MED ORDER — FAMOTIDINE 20 MG PO TABS
20.0000 mg | ORAL_TABLET | Freq: Two times a day (BID) | ORAL | Status: DC
  Filled 2022-07-29: qty 1

## 2022-07-29 MED ORDER — POLYETHYLENE GLYCOL 3350 17 G PO PACK
17.0000 g | PACK | Freq: Every day | ORAL | Status: DC | PRN
  Administered 2022-08-04: 17 g

## 2022-07-29 MED ORDER — INSULIN ASPART 100 UNIT/ML IJ SOLN
20.0000 [IU] | Freq: Once | INTRAMUSCULAR | Status: AC
  Administered 2022-07-29: 20 [IU] via SUBCUTANEOUS

## 2022-07-29 MED ORDER — FENTANYL BOLUS VIA INFUSION
50.0000 ug | INTRAVENOUS | Status: DC | PRN
  Administered 2022-07-29 (×3): 50 ug via INTRAVENOUS

## 2022-07-29 MED ORDER — EPINEPHRINE 1 MG/10ML IJ SOSY
PREFILLED_SYRINGE | INTRAMUSCULAR | Status: AC | PRN
  Administered 2022-07-29 (×2): 1 mg via INTRAVENOUS

## 2022-07-29 MED ORDER — POTASSIUM CHLORIDE 10 MEQ/100ML IV SOLN
10.0000 meq | INTRAVENOUS | Status: DC
  Administered 2022-07-29 – 2022-07-30 (×3): 10 meq via INTRAVENOUS
  Filled 2022-07-29 (×4): qty 100

## 2022-07-29 MED ORDER — EPINEPHRINE 1 MG/10ML IJ SOSY
PREFILLED_SYRINGE | INTRAMUSCULAR | Status: AC | PRN
  Administered 2022-07-29 (×4): 1 mg via INTRAVENOUS

## 2022-07-29 MED ORDER — MAGNESIUM SULFATE 2 GM/50ML IV SOLN: 2.0000 g | Freq: Once | INTRAVENOUS | Status: DC

## 2022-07-29 NOTE — Code Documentation (Signed)
Pulse palpable on pulse check at this time.

## 2022-07-29 NOTE — ED Notes (Signed)
Pulse check, pulse palpable at this time.

## 2022-07-29 NOTE — ED Notes (Signed)
Dewald MD (PCCM) at bedside.

## 2022-07-29 NOTE — ED Notes (Signed)
Family at bedside with Darl Householder, MD. Per MD, family states no more compressions but will continue supportive therapy.

## 2022-07-29 NOTE — Code Documentation (Signed)
MD and RT noted apparent issue with ETT, SpO2 low in 50-60s. Pt reintubated at this time.

## 2022-07-29 NOTE — Code Documentation (Signed)
2036 pulse check w/ no pulse- pt placed on zoll pads at this time. CPR resumed.

## 2022-07-29 NOTE — ED Provider Notes (Signed)
[facility] Provider Note   CSN: [id number] Arrival date & time: 08/02/2022  2037     History {Add pertinent medical, surgical, social history, OB history to HPI:1} No chief complaint on file.   Bobby Obrien is a 62 y.o. male.  HPI     Home Medications Prior to Admission medications   Medication Sig Start Date End Date Taking? Authorizing Provider  aspirin 81 MG tablet Take 81 mg by mouth daily.    [provider]  dapagliflozin propanediol (FARXIGA) 10 MG TABS tablet Take 1 tablet (10 mg total) by mouth daily before breakfast. 06/07/22   Panosh, Standley Brooking, MD  lisinopril-hydrochlorothiazide (ZESTORETIC) 20-25 MG tablet TAKE ONE TABLET BY MOUTH DAILY Patient taking differently: Take 0.5 tablets by mouth every other day. Takes half (1/2) a tablet every other day 05/05/21   Panosh, Standley Brooking, MD      Allergies    Crestor [rosuvastatin calcium], Lipitor [atorvastatin], and Zocor [simvastatin]    Review of Systems   Review of Systems  Physical Exam Updated Vital Signs BP (!) 149/90   Pulse 69   Resp 20   Ht 6' (1.829 m)   SpO2 100%   BMI 26.18 kg/m  Physical Exam  ED Results / Procedures / Treatments   Labs (all labs ordered are listed, but only abnormal results are displayed) Labs Reviewed  BASIC METABOLIC PANEL - Abnormal; Notable for the following components:      Result Value   Potassium 2.8 (*)    CO2 13 (*)    Glucose, Bld 410 (*)    Creatinine, Ser 1.77 (*)    Calcium 8.5 (*)    GFR, Estimated 43 (*)    Anion gap 19 (*)    All other components within normal limits  CBC - Abnormal; Notable for the following components:   WBC 12.4 (*)    MCV 103.7 (*)    All other components within normal limits  PROTIME-INR - Abnormal; Notable for the following components:   Prothrombin Time 19.2 (*)    INR 1.6 (*)    All other components within normal limits  MAGNESIUM - Abnormal; Notable for the following components:    Magnesium 3.4 (*)    All other components within normal limits  HEPATIC FUNCTION PANEL - Abnormal; Notable for the following components:   Total Protein 5.0 (*)    Albumin 2.7 (*)    AST 411 (*)    ALT 478 (*)    All other components within normal limits  GLUCOSE, CAPILLARY - Abnormal; Notable for the following components:   Glucose-Capillary 458 (*)    All other components within normal limits  CBG MONITORING, ED - Abnormal; Notable for the following components:   Glucose-Capillary 395 (*)    All other components within normal limits  I-STAT CHEM 8, ED - Abnormal; Notable for the following components:   Potassium 2.7 (*)    Creatinine, Ser 1.70 (*)    Glucose, Bld 406 (*)    Calcium, Ion 1.08 (*)    TCO2 21 (*)    All other components within normal limits  TROPONIN I (HIGH SENSITIVITY) - Abnormal; Notable for the following components:   Troponin I (High Sensitivity) 563 (*)    All other components within normal limits  RESPIRATORY PANEL BY PCR  CULTURE, RESPIRATORY W GRAM STAIN  CULTURE, BLOOD (ROUTINE X 2)  CULTURE, BLOOD (ROUTINE X 2)  RESP PANEL BY RT-PCR (RSV, FLU  A&B, COVID)  RVPGX2  MRSA NEXT GEN BY PCR, NASAL  BRAIN NATRIURETIC PEPTIDE  BLOOD GAS, ARTERIAL  HIV ANTIBODY (ROUTINE TESTING W REFLEX)  BLOOD GAS, ARTERIAL  TRIGLYCERIDES  CBC  MAGNESIUM  PROCALCITONIN  LACTIC ACID, PLASMA  LACTIC ACID, PLASMA  RAPID URINE DRUG SCREEN, HOSP PERFORMED  ETHANOL  URINALYSIS, ROUTINE W REFLEX MICROSCOPIC  COMPREHENSIVE METABOLIC PANEL  HEMOGLOBIN A1C  TROPONIN I (HIGH SENSITIVITY)    EKG EKG Interpretation  Date/Time:  Saturday July 29 2022 21:14:19 EDT Ventricular Rate:  70 PR Interval:  154 QRS Duration: 144 QT Interval:  497 QTC Calculation: 556 R Axis:   126 Text Interpretation: Atrial fibrillation Ventricular tachycardia, unsustained Nonspecific intraventricular conduction delay Anterolateral infarct, age indeterminate No significant change since last  tracing Confirmed by Wandra Arthurs [phone]) on 07/09/2022 10:10:56 PM  Radiology CT Head Wo Contrast  Result Date: 07/30/2022 CLINICAL DATA:  cardiac arrest EXAM: CT HEAD WITHOUT CONTRAST TECHNIQUE: Contiguous axial images were obtained from the base of the skull through the vertex without intravenous contrast. RADIATION DOSE REDUCTION: This exam was performed according to the departmental dose-optimization program which includes automated exposure control, adjustment of the mA and/or kV according to patient size and/or use of iterative reconstruction technique. COMPARISON:  None Available. FINDINGS: Brain: No evidence of large-territorial acute infarction. No parenchymal hemorrhage. No mass lesion. No extra-axial collection. No mass effect or midline shift. No hydrocephalus. Basilar cisterns are patent. Vascular: No hyperdense vessel. Skull: No acute fracture or focal lesion. Sinuses/Orbits: Almost complete opacification of the maxillary sinuses bilaterally. Secretions within the nasopharynx. Partially visualized enteric and endotracheal tubes. Otherwise visualize paranasal sinuses and mastoid air cells are clear. The orbits are unremarkable. Other: None. IMPRESSION: 1. No acute intracranial abnormality. 2. Maxillary sinus mucosal disease. Electronically Signed   By: Iven Finn M.D.   On: 07/09/2022 23:10   DG Chest Portable 2 Views  Result Date: 07/07/2022 CLINICAL DATA:  Intubated EXAM: CHEST  2 VIEW PORTABLE COMPARISON:  12/26/2020 FINDINGS: Extensive alveolar opacities predominantly in the upper lobes bilaterally consistent with pulmonary edema or diffuse bilateral pneumonia. Enlarged cardiac silhouette. No pneumothorax. Endotracheal tube tip is at the thoracic inlet. IMPRESSION: Extensive bilateral alveolar process with an upper lobe predominance most likely pulmonary edema. Electronically Signed   By: Sammie Bench M.D.   On: 07/11/2022 21:22    Procedures Procedures  {Document cardiac  monitor, telemetry assessment procedure when appropriate:1}  Medications Ordered in ED Medications  fentaNYL (SUBLIMAZE) 50 MCG/ML injection (has no administration in time range)  EPINEPHrine (ADRENALIN) 5 mg in NS 250 mL (0.02 mg/mL) premix infusion (20 mcg/min Intravenous Rate/Dose Change 07/30/2022 2149)  norepinephrine (LEVOPHED) 4mg  in 267mL (0.016 mg/mL) premix infusion (15 mcg/min Intravenous Rate/Dose Change 07/16/2022 2219)  vasopressin (PITRESSIN) 20 Units in sodium chloride 0.9 % 100 mL infusion-*FOR SHOCK* (0.03 Units/min Intravenous New Bag/Given 07/18/2022 2215)  docusate (COLACE) 50 MG/5ML liquid 100 mg (has no administration in time range)  polyethylene glycol (MIRALAX / GLYCOLAX) packet 17 g (has no administration in time range)  famotidine (PEPCID) tablet 20 mg (has no administration in time range)  propofol (DIPRIVAN) 1000 MG/100ML infusion (has no administration in time range)  docusate (COLACE) 50 MG/5ML liquid 100 mg (has no administration in time range)  polyethylene glycol (MIRALAX / GLYCOLAX) packet 17 g (has no administration in time range)  0.9 %  sodium chloride infusion (has no administration in time range)  fentaNYL 2552mcg in NS 265mL (74mcg/ml) infusion-PREMIX (has no administration in  time range)  fentaNYL (SUBLIMAZE) bolus via infusion 50-100 mcg (has no administration in time range)  potassium chloride 10 mEq in 100 mL IVPB (has no administration in time range)  potassium chloride (KLOR-CON) packet 60 mEq (has no administration in time range)  sodium bicarbonate 150 mEq in sterile water 1,150 mL infusion (has no administration in time range)  insulin aspart (novoLOG) injection 0-15 Units (has no administration in time range)  Chlorhexidine Gluconate Cloth 2 % PADS 6 each (6 each Topical Given 07/17/2022 2309)  calcium chloride injection (1 g Intravenous Given 07/22/2022 2038)  sodium bicarbonate injection (50 mEq Intravenous Given 07/22/2022 2100)  etomidate (AMIDATE)  injection (20 mg Intravenous Given 08/02/2022 2044)  succinylcholine (ANECTINE) injection (100 mg Intravenous Given 07/09/2022 2044)  EPINEPHrine (ADRENALIN) 1 MG/10ML injection (1 mg Intravenous Given 08/04/2022 2125)  EPINEPHrine (ADRENALIN) 1 MG/10ML injection (1 mg Intravenous Given 07/22/2022 2151)  fentaNYL (SUBLIMAZE) injection 100 mcg (100 mcg Intravenous Given 07/25/2022 2109)  iohexol (OMNIPAQUE) 350 MG/ML injection 75 mL (75 mLs Intravenous Contrast Given  2300)    ED Course/ Medical Decision Making/ A&P   {   Click here for ABCD2, HEART and other calculatorsREFRESH Note before signing :1}                          Medical Decision Making Amount and/or Complexity of Data Reviewed Labs: ordered. Radiology: ordered.  Risk Prescription drug management. Decision regarding hospitalization.   ***  {Document critical care time when appropriate:1} {Document review of labs and clinical decision tools ie heart score, Chads2Vasc2 etc:1}  {Document your independent review of radiology images, and any outside records:1} {Document your discussion with family members, caretakers, and with consultants:1} {Document social determinants of health affecting pt's care:1} {Document your decision making why or why not admission, treatments were needed:1} Final Clinical Impression(s) / ED Diagnoses Final diagnoses:  Cardiac arrest ([place])  Hypokalemia    Rx / DC Orders ED Discharge Orders     None

## 2022-07-29 NOTE — Progress Notes (Signed)
RT NOTE:  Pt transported to CT, then to 79M without event.

## 2022-07-29 NOTE — Progress Notes (Signed)
eLink Physician-Brief Progress Note Patient Name: Bobby Obrien DOB: January 03, 1961 MRN: [id number]   Date of Service  07/19/2022  HPI/Events of Note  62/M with hx of DVT/PE on coumadin, CHF, dyslipidemia, prostate CA, DM, presenting after witnessed cardiac arrest.  Pt was reportedly dancing and then had a witnessed arrest.  Pt had intermittent ROSC with total estimated down time of atleast 30 minutes.  Pt hypotensive started on epi and levo.  EMS reported Vtach and was shocked several times but no strips available for review.  Pt gagging and coughing against ETT but not following commands.   CT head 1. No acute intracranial abnormality. 2. Maxillary sinus mucosal disease.  CTA chest - no PE, diffuse consolidation.   eICU Interventions  Continue levophed, epinephrine and vasopressin.  Replete electrolytes.  Insert central line and arterial line.  Continue Unasyn.  TTM protocol in place.  Insulin for glucose control.  Check ABG.  Pt is on HCO3 gtt.  Famotidine for GI prophylaxis.  Heparin for DVT prophylaxis. Foley cath ordered.      Intervention Category Evaluation Type: New Patient Evaluation  Elsie Lincoln 07/28/2022, 11:26 PM

## 2022-07-29 NOTE — Consult Note (Signed)
Cardiology Consultation   Patient ID: Bobby Obrien MRN: [id number]; DOB: 1961-01-07  Admit date: 07/14/2022 Date of Consult: 07/11/2022  PCP:  Burnis Medin, MD   [facility] Providers Cardiologist:  Janina Mayo, MD        Patient Profile:   Bobby Obrien is a 62 y.o. 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  History of Present Illness:   Bobby Obrien was in his usual state of health today.  He went to a dinner with his family, was dancing without any particular complaints and after dinner, per daughter, patient started feeling hot, voiced that he was going to faint and subsequently lost consciousness. There was a nurse present at the dinner, she checked for a pulse and it was present.  However, 5 to 10 minutes before EMS arrived he lost pulse and CPR was started.  Upon EMS arrival it was determined that he had a shockable rhythm and was shocked multiple times.  None of these strips are available.  Patient was then transferred to the emergency department.  The total low-flow time was approximately 1 hour since the patient kept going in and out of from cardiac arrest.  Some of these episodes happened while in the ER but all of them where PEA.  We do not have any documentation of a shockable rhythm. Per nurse, there was difficulty maintaining oxygen saturation which brings the thought of aspiration/primary respiratory arrest.  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 Labile blood pressure in the ER bouncing between 60s/40s to 200/100 range. There is no certainty about neurological status.  Not following commands.  Labs are relevant for a blood glucose of 400, white count of 12, potassium of 2.7, fattening of 1.7 (prior 1.2).  No gas available,  no lactate.   Past Medical History:  Diagnosis Date   BPH associated with nocturia    History of asthma    childhood   History of CHF (congestive heart failure) 04/2004   in setting PE   History of pulmonary embolism 04/2004   with HF and hypertension;  bilateral lower lobe,  completed coumadin   (07-08-2021  pt stated never had clots before and none since, pt had negative work-up for blood disorder at that time)   Hyperlipidemia    Hypertension    followed by pcp   (per cardiology note in epic 0302-2023 nuclear stress test 07-27-2004 normal  and echo 07-27-2005 (ef 50-55%)   Malignant neoplasm prostate ([place]) 01/2021   urologist--- dr wrenn/  radiation oncology-- dr Tammi Klippel;    dx 09/ 2022  Gleason 3+4, PSA 10.5   Panic disorder    Paroxysmal SVT (supraventricular tachycardia) ([place])    nonsustained;   ED visit 12-26-2020  SVT heart rate 2013 x2 doses adenosine;  followed up with pcp was referred to cardiology at the time but pt had no visit;  pt needed pcp clearance prior to surgery on 07-08-2021 and was referred to cardiology, office visit w/ cardiologist-- dr Stanton Kidney branch 07-07-2021, was cleared for surgery, pt to started taking toprol   Tachycardia    Type 2 diabetes mellitus ([place])    followed by pcp   (03-03-2023n pt stated checks blood sugar once daily , fasting (130s) and not fasting (165-170)  Wears partial dentures    upper    Past Surgical History:  Procedure Laterality Date   GOLD SEED IMPLANT N/A 07/12/2021   Procedure: GOLD SEED IMPLANT;  Surgeon: [name], MD;  Location: [facility];  Service: Urology;  Laterality: N/A;  30 MINS FOR CASE   NO PAST SURGERIES     SPACE OAR INSTILLATION N/A 07/12/2021   Procedure: SPACE OAR INSTILLATION;  Surgeon: [name], MD;  Location: [facility];  Service: Urology;  Laterality: N/A;     Home Medications:  Prior to Admission medications   Medication Sig Start Date End Date Taking?  Authorizing Provider  aspirin 81 MG tablet Take 81 mg by mouth daily.    [provider]  dapagliflozin propanediol (FARXIGA) 10 MG TABS tablet Take 1 tablet (10 mg total) by mouth daily before breakfast. 06/07/22   Panosh, Standley Brooking, MD  lisinopril-hydrochlorothiazide (ZESTORETIC) 20-25 MG tablet TAKE ONE TABLET BY MOUTH DAILY Patient taking differently: Take 0.5 tablets by mouth every other day. Takes half (1/2) a tablet every other day 05/05/21   Panosh, Standley Brooking, MD    Inpatient Medications: Scheduled Meds:  docusate  100 mg Per Tube BID   famotidine  20 mg Per Tube BID   fentaNYL       [START ON 07/30/2022] polyethylene glycol  17 g Per Tube Daily   potassium chloride  60 mEq Per Tube Once   Continuous Infusions:  sodium chloride     calcium gluconate     epinephrine 20 mcg/min (07/19/2022 2149)   fentaNYL infusion INTRAVENOUS     norepinephrine (LEVOPHED) Adult infusion 15 mcg/min (07/17/2022 2219)   potassium chloride     propofol (DIPRIVAN) infusion     sodium bicarbonate 150 mEq in dextrose 5 % 1,150 mL infusion 125 mL/hr at  2127   vasopressin 0.03 Units/min (07/13/2022 2215)   PRN Meds: calcium chloride, docusate, EPINEPHrine, EPINEPHrine, etomidate, fentaNYL, fentaNYL, polyethylene glycol, sodium bicarbonate, succinylcholine  Allergies:    Allergies  Allergen Reactions   Crestor [Rosuvastatin Calcium]     Feels weak    Lipitor [Atorvastatin]     REACTION: leg cramps   Zocor [Simvastatin]     REACTION: leg cramps    Social History:   Social History   Socioeconomic History   Marital status: Married    Spouse name: Not on file   Number of children: Not on file   Years of education: Not on file   Highest education level: Not on file  Occupational History   Not on file  Tobacco Use   Smoking status: Never   Smokeless tobacco: Never  Vaping Use   Vaping Use: Never used  Substance and Sexual Activity   Alcohol use: Yes    Comment: occasional    Drug use: Never   Sexual activity: Not on file  Other Topics Concern   Not on file  Social History Narrative   HH of 2 married    Musician   Working evening arenas    [employer]    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          Social Determinants of Health   Financial Resource Strain: Not on file  Food Insecurity: Not on file  Transportation Needs: Not on file  Physical Activity: Not on file  Stress: Not on file  Social Connections: Not on file  Intimate Partner Violence: Not on file    Family History:   Family History  Problem Relation Age of Onset   Kidney disease Mother    Hypertension Mother    Hypertension Father    Heart attack Father      ROS:  Please see the history of present illness.  All other ROS reviewed and negative.     Physical Exam/Data:   Vitals:   07/22/2022 2230 07/08/2022 2235 07/18/2022 2236 07/07/2022 2239  BP: 126/81 (!) 190/119 (!) 180/105 (!) 141/85  Pulse: 75 100 96 75  Resp: (!) 26 (!) 33 (!) 25 20  SpO2: 99% 98% 93% (!) 85%  Height:       No intake or output data in the 24 hours ending 07/24/2022 2251    08/08/2021    1:15 PM 07/12/2021    8:54 AM 07/08/2021   10:37 AM  Last 3 Weights  Weight (lbs) 193 lb 194 lb 14.4 oz 207 lb  Weight (kg) 87.544 kg 88.406 kg 93.895 kg     Body mass index is 26.18 kg/m.  Neck: no JVD Vascular: No carotid bruits. Cardiac: The rate and rhythm, no murmur. Lungs: Coarse breath sounds bilaterally Abd: soft.  Ext: no edema  EKG:  The EKG was personally reviewed and demonstrates: Normal sinus rhythm, right bundle branch block, left anterior physical block  Laboratory Data:  High Sensitivity Troponin:   Recent Labs  Lab 07/17/2022 2050  TROPONINIHS 563*     Chemistry Recent Labs  Lab 07/14/2022 2050  2059  NA 136 137  K 2.8* 2.7*  CL 104 102  CO2 13*  --   GLUCOSE 410* 406*  BUN 13 16  CREATININE 1.77* 1.70*  CALCIUM 8.5*  --   MG 3.4*  --   GFRNONAA 43*  --   ANIONGAP 19*  --     Recent Labs  Lab  07/23/2022 2050  PROT 5.0*  ALBUMIN 2.7*  AST 411*  ALT 478*  ALKPHOS 69  BILITOT 0.8   Lipids No results for input(s): "CHOL", "TRIG", "HDL", "LABVLDL", "LDLCALC", "CHOLHDL" in the last 168 hours.  Hematology Recent Labs  Lab 07/25/2022 2050 08/05/2022 2059  WBC 12.4*  --   RBC 4.56  --   HGB 14.4 15.0  HCT 47.3 44.0  MCV 103.7*  --   MCH 31.6  --   MCHC 30.4  --   RDW 11.8  --   PLT 154  --    Thyroid No results for input(s): "TSH", "FREET4" in the last 168 hours.  BNP Recent Labs  Lab 07/14/2022 2050  BNP 77.8    DDimer No results for input(s): "DDIMER" in the last 168 hours.   Radiology/Studies:  DG Chest Portable 2 Views  Result Date: 07/16/2022 CLINICAL DATA:  Intubated EXAM: CHEST  2 VIEW PORTABLE COMPARISON:  12/26/2020 FINDINGS: Extensive alveolar opacities predominantly in the upper lobes bilaterally consistent with pulmonary edema or diffuse bilateral pneumonia. Enlarged cardiac silhouette. No pneumothorax. Endotracheal tube tip is at the thoracic inlet. IMPRESSION: Extensive bilateral alveolar process with an upper lobe predominance most likely pulmonary edema. Electronically Signed   By: Sammie Bench M.D.   On: 07/12/2022 21:22     Assessment and Plan:   Bobby Obrien is a 62 year old with medical history of diabetes, DVT/PE, hypertension, SVT and prostate cancer who suffered an episode of cardiac arrest.  Based on history, the likelihood is that this is probably a primary respiratory arrest.  There was concern for a shockable rhythm when EMS arrived but none of these strips are available and patient  has a wide QRS at baseline. He had a prolonged low flow state and at the moment we not know his underlying neurological status.  Cardiac standpoint, his only known cardiac history was SVT.  Per patient's family, he was not complaining of chest pressure or shortness of breath the days prior to the event.  They also denied chest pain prior to his cardiac arrest.  Post  ROSC did not show ST segment elevation. His MIRACLE2 score is 5.  Cardiac arrest Acute hypoxic respiratory failure Acute kidney injury Hypokalemia. Type 2 diabetes. History of DVT/PE. History of SVT. History of prostate cancer.  Recommendations: -Obtain blood gas to assess acid-base status.  Lactate levels. -Trend troponins -Echoardiogram in the morning -Agree with arterial line placement for accurate measurement of hemodynamic status. -Consider CT head -Consider CTPE given PEA arrest and prior history of PE with subtherapeutic INR   Risk Assessment/Risk Scores:                For questions or updates, please contact [place] Please consult [email or web address] for contact info under    Signed, Ewing Schlein, MD  08/01/2022 10:51 PM

## 2022-07-29 NOTE — ED Notes (Signed)
Pt Sating at 77-82 area, Alarm paused at bedside. Staff in room

## 2022-07-29 NOTE — ED Triage Notes (Signed)
Pt BIB EMS, no complaints prior to event, witnessed arrest by family at 64. EMS reports pt intermittently had pulses, longest ROSC 69mins. EMS reports pt would go into vtach, would shock, then would lose pulses- happened 4-5 times. 12 lead transmitted showed possible STEMI, pt then went into vtach, lost pulses again. Given a total of 10 epi, 450 amio, 2g mag, 1L NS. Bilateral 18g to forearms.

## 2022-07-29 NOTE — ED Notes (Signed)
Pt Desat @ 77- Staff at bedside BP 178/95

## 2022-07-29 NOTE — Progress Notes (Signed)
   07/29/22 2230  Spiritual Encounters  Type of Visit Initial  Care provided to: [facility] partners present during encounter Nurse  Referral source Nurse (RN/NT/LPN)  Reason for visit Trauma  OnCall Visit Yes   Chaplain responded to call from RN to come help family of PT in the ED.  Approx 8 people were int he hallway out side PT room.  Chaplian introduced himself and escorted family members to consultation room.  Chaplain provided water to the family as they waited.  PT was taken from CT to 61M ICU.  Chaplain escorted family to the 61M waiting area and got them settled in. Chap informed nursing staff that wife of PT was present in waiting area.  Chaplain services remain available by page.

## 2022-07-29 NOTE — ED Notes (Signed)
Monitor Alarming Apnea, pt in CT

## 2022-07-29 NOTE — ED Notes (Signed)
Gonzale MD (Cards) at bedside.

## 2022-07-29 NOTE — ED Notes (Signed)
Pulse slowing at this time, see emar.

## 2022-07-29 NOTE — Progress Notes (Signed)
eLink Physician-Brief Progress Note Patient Name: Bobby Obrien DOB: 06-05-1960 MRN: [id number]   Date of Service  07/29/2022  HPI/Events of Note  Notified of hyperglycemia with glucose at 458 which is beyond the sliding scale coverage.   eICU Interventions  Give 20 units of novolog now.     Intervention Category Intermediate Interventions: Other:  Elsie Lincoln 07/29/2022, 11:42 PM  3:03 AM Lactic acid at 6.9. K 4.0. Crea 2.17. Glucose in the 500s.   Plan> Hold last 37meq IV order for potassium.  Continue to trend lactate. Continue titrating insulin gtt per protocol.    4:31 AM ABG 7.305/32.6/158  Plan> Ok on current settings.

## 2022-07-29 NOTE — H&P (Cosign Needed Addendum)
NAME:  Bobby Obrien, MRN:  [id number], DOB:  [birth year], LOS: 0 ADMISSION DATE:  07/23/2022, CONSULTATION DATE:  3/23 REFERRING MD:  Dr. Darl Householder, CHIEF COMPLAINT:  cardiac arrest   History of Present Illness:  Patient is a 62 year old male with pertinent PMH previous DVT/PE in 2005 not currently on coumadin, CHF, HLD, prostate cancer, SVT, T2DM presents to [facility] ED on 3/23 cardiac arrest.  On 3/23 patient was at a dinner doing well. Was dancing and got tired drinking a lot of water. Then had witnessed arrest around 9.  EMS arrived and started CPR.  Had intermittent ROSC.  Longest ROSC about 10 minutes.  EMS reports patient's rhythm would go into V. tach that requiring multiple shocks.  Per EMS EKG showing possible STEMI.  Patient given amio, mag, fluids and transported to [facility] ED.  Upon arrival to [facility] ED patient without pulses PEA arrest and CPR resumed.  After ROSC patient with sats 50s to 60s.  Thought to have issues with ETT position and patient reintubated. Patient had another PEA arrest while in ED and ROSC achieved. Patient arrested 5 different times with estimated total ROSC at least 30 minutes. Patient was coughing/gagging against ETT and was started on some sedation; not following commands. Hypotensive started on epi and levo. Bicarb drip started. CXR showing extensive b/l infiltrate vs. Pulm edema. EKG no ST elevation; prolonged qtc. K 2.8, mag 3.4, glucose 410, creat 1.77, calcium 8.5, AG 19, co2 13, wbc 12.4, troponin 563, bnp 77, ast 411, alt 478. Cards consulted. PCCM consulted for icu admission.  Pertinent  Medical History   Past Medical History:  Diagnosis Date   BPH associated with nocturia    History of asthma    childhood   History of CHF (congestive heart failure) 04/2004   in setting PE   History of pulmonary embolism 04/2004   with HF and hypertension;  bilateral lower lobe,  completed coumadin   (07-08-2021  pt stated never had clots before and none since, pt had negative  work-up for blood disorder at that time)   Hyperlipidemia    Hypertension    followed by pcp   (per cardiology note in epic 0302-2023 nuclear stress test 07-27-2004 normal  and echo 07-27-2005 (ef 50-55%)   Malignant neoplasm prostate ([place]) 01/2021   urologist--- dr wrenn/  radiation oncology-- dr Tammi Klippel;    dx 09/ 2022  Gleason 3+4, PSA 10.5   Panic disorder    Paroxysmal SVT (supraventricular tachycardia) ([place])    nonsustained;   ED visit 12-26-2020  SVT heart rate 2013 x2 doses adenosine;  followed up with pcp was referred to cardiology at the time but pt had no visit;  pt needed pcp clearance prior to surgery on 07-08-2021 and was referred to cardiology, office visit w/ cardiologist-- dr Stanton Kidney branch 07-07-2021, was cleared for surgery, pt to started taking toprol   Tachycardia    Type 2 diabetes mellitus ([place])    followed by pcp   (03-03-2023n pt stated checks blood sugar once daily , fasting (130s) and not fasting (165-170)   Wears partial dentures    upper     Significant Hospital Events: Including procedures, antibiotic start and stop dates in addition to other pertinent events   3/23 vtach arrest; ROSC about 30 minutes?; pccm consulted   Interim History / Subjective:  See above  Objective   Blood pressure (!) 83/60, pulse 86, resp. rate (!) 22, height 6' (1.829 m), SpO2 90 %.  Vent Mode: PRVC FiO2 (%):  [100 %] 100 % Set Rate:  [22 bmp] 22 bmp Vt Set:  RW:212346 mL] 620 mL PEEP:  [10 cmH20] 10 cmH20 Plateau Pressure:  [28 cmH20] 28 cmH20  No intake or output data in the 24 hours ending 07/17/2022 2152 There were no vitals filed for this visit.  Examination: General:  critically ill appearing male on mech vent HEENT: MM pink/moist; ETT in place Neuro:  sedate; pupils 1-2 mm b/l sluggish to light CV: s1s2, no m/r/g PULM:  dim clear BS bilaterally; on mech vent PRVC GI: soft, bsx4 active  Extremities: cool/dry, no edema  Skin: no rashes or lesions  appreciated   Resolved Hospital Problem list     Assessment & Plan:  Vtach cardiac arrest Hypokalemia Prolonged qtc Hx of SVT and CHF: last echo 2005 EF 50-55% Hx of PE in 2005: currently off coumadin Plan: -will admit to ICU w/ continuous telemetry monitoring -cards consulted; appreciate recs -continue levo and epi; will add vaso for MAP goal >65 -Replete K; trend bmp -avoid qtc prolonging agents -trend troponin and lactate -check ABG -echo -CT head and CTA chest -send UDS, ethanol -EEG -check cultures: bcx2, UA, and trach culture -start on unasyn for possible aspiration -check procalcitonin -TTM normothermia protocol in place  Acute respiratory failure w/ hypoxia Possible pulmonary edema Plan: -LTVV strategy with tidal volumes of 6-8 cc/kg ideal body weight -check ABG and adjust settings accordingly  -Goal plateau pressures less than 30 and driving pressures less than 15 -Wean PEEP/FiO2 for SpO2 >92% -VAP bundle in place -Daily SAT and SBT -PAD protocol in place -wean sedation for RASS goal 0 to -1 -unaysn for possible aspiration -trach aspirate  Acute encephalopathy: concern for anoxic brain injury Plan: -limit sedating meds -wean sedation for RASS 0 to -1 -f/u uds, ethanol  AKI AGMA Plan: -continue bicarb drip; switch from dextrose to sterile water with hyperglycemia -Trend BMP / urinary output -Replace electrolytes as indicated -Avoid nephrotoxic agents, ensure adequate renal perfusion  Shock Liver Plan: -trend cmp  Leukocytosis -Likely reactive Plan: -abx as above -Trend WBC/fever curve  Hyperglycemia DMT2 Plan: -SSI and cbg monitoring -check A1c -switch bicarb to sterile water from dextrose  Hx of HTN Plan: -hold home anti-hypertensives -resume asa if CT head no bleed  Hx of Prostate Cancer Plan: -f/u outpt  Best Practice (right click and "Reselect all SmartList Selections" daily)   Diet/type: NPO w/ meds via tube DVT  prophylaxis: SCD; check CT head no bleed can start DVT PPx GI prophylaxis: H2B Lines: N/A Foley:  Yes, and it is still needed Code Status:  full code Last date of multidisciplinary goals of care discussion [3/23 updated family at bedside]  Labs   CBC: Recent Labs  Lab 07/07/2022 2050 07/14/2022 2059  WBC 12.4*  --   HGB 14.4 15.0  HCT 47.3 44.0  MCV 103.7*  --   PLT 154  --     Basic Metabolic Panel: Recent Labs  Lab 07/15/2022 2059  NA 137  K 2.7*  CL 102  GLUCOSE 406*  BUN 16  CREATININE 1.70*   GFR: CrCl cannot be calculated (Unknown ideal weight.). Recent Labs  Lab 07/27/2022 2050  WBC 12.4*    Liver Function Tests: No results for input(s): "AST", "ALT", "ALKPHOS", "BILITOT", "PROT", "ALBUMIN" in the last 168 hours. No results for input(s): "LIPASE", "AMYLASE" in the last 168 hours. No results for input(s): "AMMONIA" in the last 168 hours.  ABG  Component Value Date/Time   TCO2 21 (L) 07/12/2022 2059     Coagulation Profile: Recent Labs  Lab 07/11/2022 2050  INR 1.6*    Cardiac Enzymes: No results for input(s): "CKTOTAL", "CKMB", "CKMBINDEX", "TROPONINI" in the last 168 hours.  HbA1C: Hemoglobin A1C  Date/Time Value Ref Range Status  06/15/2021 04:09 PM 7.2 (A) 4.0 - 5.6 % Final   Hgb A1c MFr Bld  Date/Time Value Ref Range Status  07/06/2020 10:03 AM 7.7 (H) 4.6 - 6.5 % Final    Comment:    Glycemic Control Guidelines for People with Diabetes:Non Diabetic:  <6%Goal of Therapy: <7%Additional Action Suggested:  >8%   10/16/2019 11:46 AM 7.4 (H) 4.6 - 6.5 % Final    Comment:    Glycemic Control Guidelines for People with Diabetes:Non Diabetic:  <6%Goal of Therapy: <7%Additional Action Suggested:  >8%     CBG: Recent Labs  Lab 07/09/2022 2047  GLUCAP 395*    Review of Systems:   Patient is encephalopathic and/or intubated; therefore, history has been obtained from chart review.    Past Medical History:  He,  has a past medical history of  BPH associated with nocturia, History of asthma, History of CHF (congestive heart failure) (04/2004), History of pulmonary embolism (04/2004), Hyperlipidemia, Hypertension, Malignant neoplasm prostate ([place]) (01/2021), Panic disorder, Paroxysmal SVT (supraventricular tachycardia) ([place]), Tachycardia, Type 2 diabetes mellitus ([place]), and Wears partial dentures.   Surgical History:   Past Surgical History:  Procedure Laterality Date   GOLD SEED IMPLANT N/A 07/12/2021   Procedure: GOLD SEED IMPLANT;  Surgeon: [name], MD;  Location: [facility];  Service: Urology;  Laterality: N/A;  30 MINS FOR CASE   NO PAST SURGERIES     SPACE OAR INSTILLATION N/A 07/12/2021   Procedure: SPACE OAR INSTILLATION;  Surgeon: [name], MD;  Location: [facility];  Service: Urology;  Laterality: N/A;     Social History:   reports that he has never smoked. He has never used smokeless tobacco. He reports current alcohol use. He reports that he does not use drugs.   Family History:  His family history includes Heart attack in his father; Hypertension in his father and mother; Kidney disease in his mother.   Allergies Allergies  Allergen Reactions   Crestor [Rosuvastatin Calcium]     Feels weak    Lipitor [Atorvastatin]     REACTION: leg cramps   Zocor [Simvastatin]     REACTION: leg cramps     Home Medications  Prior to Admission medications   Medication Sig Start Date End Date Taking? Authorizing Provider  aspirin 81 MG tablet Take 81 mg by mouth daily.    [provider]  dapagliflozin propanediol (FARXIGA) 10 MG TABS tablet Take 1 tablet (10 mg total) by mouth daily before breakfast. 06/07/22   Panosh, Standley Brooking, MD  lisinopril-hydrochlorothiazide (ZESTORETIC) 20-25 MG tablet TAKE ONE TABLET BY MOUTH DAILY Patient taking differently: Take 0.5 tablets by mouth every other day. Takes half (1/2) a tablet every other day 05/05/21   Panosh, Standley Brooking, MD      Critical care time: 50 minutes    JD Rexene Agent [facility] 07/27/2022, 9:53 PM  Please see [email or web address] for pager details.  From 7A-7P if no response, please call [phone]. After hours, please call ELink [phone].

## 2022-07-29 NOTE — Progress Notes (Signed)
Pharmacy Antibiotic Note  Bobby Obrien is a 62 y.o. male admitted on 07/29/2022 with  s/p cardiac arrest .  Pharmacy has been consulted for Unasyn dosing. WBC 12.4. Noted renal dysfunction.   Plan: Unasyn 3g IV q8h Trend WBC, temp, renal function   Height: 6' (182.9 cm) Weight: 87.8 kg (193 lb 9 oz) IBW/kg (Calculated) : 77.6  No data recorded.  Recent Labs  Lab 07/29/22 2050 07/29/22 2059  WBC 12.4*  --   CREATININE 1.77* 1.70*    Estimated Creatinine Clearance: 48.8 mL/min (A) (by C-G formula based on SCr of 1.7 mg/dL (H)).    Allergies  Allergen Reactions   Crestor [Rosuvastatin Calcium]     Feels weak    Lipitor [Atorvastatin]     REACTION: leg cramps   Zocor [Simvastatin]     REACTION: leg cramps   Narda Bonds, PharmD, BCPS Clinical Pharmacist Phone: [phone]

## 2022-07-30 ENCOUNTER — Inpatient Hospital Stay (HOSPITAL_COMMUNITY): Payer: Medicaid Other

## 2022-07-30 DIAGNOSIS — I469 Cardiac arrest, cause unspecified: Secondary | ICD-10-CM

## 2022-07-30 DIAGNOSIS — R4182 Altered mental status, unspecified: Secondary | ICD-10-CM

## 2022-07-30 DIAGNOSIS — I498 Other specified cardiac arrhythmias: Secondary | ICD-10-CM

## 2022-07-30 LAB — COMPREHENSIVE METABOLIC PANEL
ALT: 555 U/L — ABNORMAL HIGH (ref 0–44)
AST: 659 U/L — ABNORMAL HIGH (ref 15–41)
Albumin: 2.9 g/dL — ABNORMAL LOW (ref 3.5–5.0)
Alkaline Phosphatase: 87 U/L (ref 38–126)
Anion gap: 19 — ABNORMAL HIGH (ref 5–15)
BUN: 20 mg/dL (ref 8–23)
CO2: 14 mmol/L — ABNORMAL LOW (ref 22–32)
Calcium: 8 mg/dL — ABNORMAL LOW (ref 8.9–10.3)
Chloride: 98 mmol/L (ref 98–111)
Creatinine, Ser: 2.17 mg/dL — ABNORMAL HIGH (ref 0.61–1.24)
GFR, Estimated: 33 mL/min — ABNORMAL LOW (ref >60)
Glucose, Bld: 594 mg/dL (ref 70–99)
Potassium: 4 mmol/L (ref 3.5–5.1)
Sodium: 131 mmol/L — ABNORMAL LOW (ref 135–145)
Total Bilirubin: 1.2 mg/dL (ref 0.3–1.2)
Total Protein: 5.6 g/dL — ABNORMAL LOW (ref 6.5–8.1)

## 2022-07-30 LAB — URINALYSIS, ROUTINE W REFLEX MICROSCOPIC
Bilirubin Urine: NEGATIVE
Glucose, UA: >=500 mg/dL — AB
Ketones, ur: 5 mg/dL — AB
Leukocytes,Ua: NEGATIVE
Nitrite: NEGATIVE
Protein, ur: >=300 mg/dL — AB
Specific Gravity, Urine: 1.018 (ref 1.005–1.030)
pH: 5 (ref 5.0–8.0)

## 2022-07-30 LAB — GLUCOSE, CAPILLARY
Glucose-Capillary: 548 mg/dL (ref 70–99)
Glucose-Capillary: 521 mg/dL (ref 70–99)
Glucose-Capillary: 581 mg/dL (ref 70–99)
Glucose-Capillary: 541 mg/dL (ref 70–99)
Glucose-Capillary: 556 mg/dL (ref 70–99)
Glucose-Capillary: 497 mg/dL — ABNORMAL HIGH (ref 70–99)
Glucose-Capillary: 462 mg/dL — ABNORMAL HIGH (ref 70–99)
Glucose-Capillary: 470 mg/dL — ABNORMAL HIGH (ref 70–99)
Glucose-Capillary: 400 mg/dL — ABNORMAL HIGH (ref 70–99)
Glucose-Capillary: 388 mg/dL — ABNORMAL HIGH (ref 70–99)
Glucose-Capillary: 420 mg/dL — ABNORMAL HIGH (ref 70–99)
Glucose-Capillary: 308 mg/dL — ABNORMAL HIGH (ref 70–99)
Glucose-Capillary: 241 mg/dL — ABNORMAL HIGH (ref 70–99)
Glucose-Capillary: 269 mg/dL — ABNORMAL HIGH (ref 70–99)
Glucose-Capillary: 241 mg/dL — ABNORMAL HIGH (ref 70–99)
Glucose-Capillary: 276 mg/dL — ABNORMAL HIGH (ref 70–99)
Glucose-Capillary: 212 mg/dL — ABNORMAL HIGH (ref 70–99)
Glucose-Capillary: 215 mg/dL — ABNORMAL HIGH (ref 70–99)
Glucose-Capillary: 204 mg/dL — ABNORMAL HIGH (ref 70–99)
Glucose-Capillary: 211 mg/dL — ABNORMAL HIGH (ref 70–99)
Glucose-Capillary: 200 mg/dL — ABNORMAL HIGH (ref 70–99)
Glucose-Capillary: 184 mg/dL — ABNORMAL HIGH (ref 70–99)
Glucose-Capillary: 191 mg/dL — ABNORMAL HIGH (ref 70–99)
Glucose-Capillary: 174 mg/dL — ABNORMAL HIGH (ref 70–99)
Glucose-Capillary: 163 mg/dL — ABNORMAL HIGH (ref 70–99)
Glucose-Capillary: 157 mg/dL — ABNORMAL HIGH (ref 70–99)

## 2022-07-30 LAB — POCT I-STAT 7, (LYTES, BLD GAS, ICA,H+H)
Acid-base deficit: 8 mmol/L — ABNORMAL HIGH (ref 0.0–2.0)
Acid-base deficit: 9 mmol/L — ABNORMAL HIGH (ref 0.0–2.0)
Acid-base deficit: 9 mmol/L — ABNORMAL HIGH (ref 0.0–2.0)
Bicarbonate: 15.5 mmol/L — ABNORMAL LOW (ref 20.0–28.0)
Bicarbonate: 16.2 mmol/L — ABNORMAL LOW (ref 20.0–28.0)
Bicarbonate: 20.2 mmol/L (ref 20.0–28.0)
Calcium, Ion: 1.08 mmol/L — ABNORMAL LOW (ref 1.15–1.40)
Calcium, Ion: 1.09 mmol/L — ABNORMAL LOW (ref 1.15–1.40)
Calcium, Ion: 1.12 mmol/L — ABNORMAL LOW (ref 1.15–1.40)
HCT: 45 % (ref 39.0–52.0)
HCT: 47 % (ref 39.0–52.0)
HCT: 48 % (ref 39.0–52.0)
Hemoglobin: 15.3 g/dL (ref 13.0–17.0)
Hemoglobin: 16 g/dL (ref 13.0–17.0)
Hemoglobin: 16.3 g/dL (ref 13.0–17.0)
O2 Saturation: 98 %
O2 Saturation: 99 %
O2 Saturation: 99 %
Patient temperature: 36.8
Patient temperature: 37
Patient temperature: 98.2
Potassium: 3.5 mmol/L (ref 3.5–5.1)
Potassium: 3.6 mmol/L (ref 3.5–5.1)
Potassium: 4.2 mmol/L (ref 3.5–5.1)
Sodium: 132 mmol/L — ABNORMAL LOW (ref 135–145)
Sodium: 134 mmol/L — ABNORMAL LOW (ref 135–145)
Sodium: 134 mmol/L — ABNORMAL LOW (ref 135–145)
TCO2: 16 mmol/L — ABNORMAL LOW (ref 22–32)
TCO2: 17 mmol/L — ABNORMAL LOW (ref 22–32)
TCO2: 22 mmol/L (ref 22–32)
pCO2 arterial: 31 mmHg — ABNORMAL LOW (ref 32–48)
pCO2 arterial: 32.6 mmHg (ref 32–48)
pCO2 arterial: 47.7 mmHg (ref 32–48)
pH, Arterial: 7.233 — ABNORMAL LOW (ref 7.35–7.45)
pH, Arterial: 7.305 — ABNORMAL LOW (ref 7.35–7.45)
pH, Arterial: 7.306 — ABNORMAL LOW (ref 7.35–7.45)
pO2, Arterial: 122 mmHg — ABNORMAL HIGH (ref 83–108)
pO2, Arterial: 158 mmHg — ABNORMAL HIGH (ref 83–108)
pO2, Arterial: 165 mmHg — ABNORMAL HIGH (ref 83–108)

## 2022-07-30 LAB — BASIC METABOLIC PANEL
Anion gap: 15 (ref 5–15)
Anion gap: 13 (ref 5–15)
BUN: 22 mg/dL (ref 8–23)
BUN: 23 mg/dL (ref 8–23)
CO2: 21 mmol/L — ABNORMAL LOW (ref 22–32)
CO2: 24 mmol/L (ref 22–32)
Calcium: 7.9 mg/dL — ABNORMAL LOW (ref 8.9–10.3)
Calcium: 7.8 mg/dL — ABNORMAL LOW (ref 8.9–10.3)
Chloride: 98 mmol/L (ref 98–111)
Chloride: 100 mmol/L (ref 98–111)
Creatinine, Ser: 2.38 mg/dL — ABNORMAL HIGH (ref 0.61–1.24)
Creatinine, Ser: 2.06 mg/dL — ABNORMAL HIGH (ref 0.61–1.24)
GFR, Estimated: 30 mL/min — ABNORMAL LOW (ref >60)
GFR, Estimated: 36 mL/min — ABNORMAL LOW (ref >60)
Glucose, Bld: 435 mg/dL — ABNORMAL HIGH (ref 70–99)
Glucose, Bld: 267 mg/dL — ABNORMAL HIGH (ref 70–99)
Potassium: 3.3 mmol/L — ABNORMAL LOW (ref 3.5–5.1)
Potassium: 3.9 mmol/L (ref 3.5–5.1)
Sodium: 134 mmol/L — ABNORMAL LOW (ref 135–145)
Sodium: 137 mmol/L (ref 135–145)

## 2022-07-30 LAB — BLOOD GAS, ARTERIAL
Acid-Base Excess: 1.3 mmol/L (ref 0.0–2.0)
Bicarbonate: 27.2 mmol/L (ref 20.0–28.0)
Drawn by: 418751
O2 Saturation: 99.3 %
Patient temperature: 37
pCO2 arterial: 47 mmHg (ref 32–48)
pH, Arterial: 7.37 (ref 7.35–7.45)
pO2, Arterial: 137 mmHg — ABNORMAL HIGH (ref 83–108)

## 2022-07-30 LAB — CBC
HCT: 47 % (ref 39.0–52.0)
Hemoglobin: 16.1 g/dL (ref 13.0–17.0)
MCH: 32.1 pg (ref 26.0–34.0)
MCHC: 34.3 g/dL (ref 30.0–36.0)
MCV: 93.8 fL (ref 80.0–100.0)
Platelets: 222 10*3/uL (ref 150–400)
RBC: 5.01 MIL/uL (ref 4.22–5.81)
RDW: 11.9 % (ref 11.5–15.5)
WBC: 13.3 10*3/uL — ABNORMAL HIGH (ref 4.0–10.5)
nRBC: 0 % (ref 0.0–0.2)

## 2022-07-30 LAB — TROPONIN I (HIGH SENSITIVITY): Troponin I (High Sensitivity): >24000 ng/L (ref <18)

## 2022-07-30 LAB — RESPIRATORY PANEL BY PCR

## 2022-07-30 LAB — ECHOCARDIOGRAM COMPLETE
Area-P 1/2: 3.75 cm2
Calc EF: 11.3 %
Height: 72 in
MV VTI: 0.42 cm2
S' Lateral: 6 cm
Single Plane A2C EF: 8.3 %
Single Plane A4C EF: 15.8 %
Weight: 3097.02 oz

## 2022-07-30 LAB — MAGNESIUM
Magnesium: 2.6 mg/dL — ABNORMAL HIGH (ref 1.7–2.4)
Magnesium: 2 mg/dL (ref 1.7–2.4)

## 2022-07-30 LAB — RESP PANEL BY RT-PCR (RSV, FLU A&B, COVID)  RVPGX2
Influenza A by PCR: NEGATIVE
Influenza B by PCR: NEGATIVE
Resp Syncytial Virus by PCR: NEGATIVE
SARS Coronavirus 2 by RT PCR: NEGATIVE

## 2022-07-30 LAB — PROCALCITONIN: Procalcitonin: 66.25 ng/mL

## 2022-07-30 LAB — HIV ANTIBODY (ROUTINE TESTING W REFLEX): HIV Screen 4th Generation wRfx: NONREACTIVE

## 2022-07-30 LAB — RAPID URINE DRUG SCREEN, HOSP PERFORMED
Amphetamines: NOT DETECTED
Barbiturates: NOT DETECTED
Benzodiazepines: NOT DETECTED
Cocaine: NOT DETECTED
Opiates: NOT DETECTED
Tetrahydrocannabinol: NOT DETECTED

## 2022-07-30 LAB — TRIGLYCERIDES: Triglycerides: 187 mg/dL — ABNORMAL HIGH (ref <150)

## 2022-07-30 LAB — PHOSPHORUS: Phosphorus: 2.3 mg/dL — ABNORMAL LOW (ref 2.5–4.6)

## 2022-07-30 LAB — BETA-HYDROXYBUTYRIC ACID
Beta-Hydroxybutyric Acid: 1.69 mmol/L — ABNORMAL HIGH (ref 0.05–0.27)
Beta-Hydroxybutyric Acid: 0.1 mmol/L (ref 0.05–0.27)

## 2022-07-30 LAB — LACTIC ACID, PLASMA
Lactic Acid, Venous: 6.9 mmol/L (ref 0.5–1.9)
Lactic Acid, Venous: 4.2 mmol/L (ref 0.5–1.9)

## 2022-07-30 LAB — MRSA NEXT GEN BY PCR, NASAL: MRSA by PCR Next Gen: NOT DETECTED

## 2022-07-30 LAB — ETHANOL: Alcohol, Ethyl (B): <10 mg/dL (ref <10)

## 2022-07-30 MED ORDER — POTASSIUM CHLORIDE 10 MEQ/50ML IV SOLN: 10.0000 meq | INTRAVENOUS | Status: DC

## 2022-07-30 MED ORDER — POTASSIUM CHLORIDE 10 MEQ/50ML IV SOLN
10.0000 meq | INTRAVENOUS | Status: DC
  Filled 2022-07-30 (×3): qty 50

## 2022-07-30 MED ORDER — FAMOTIDINE 20 MG PO TABS
20.0000 mg | ORAL_TABLET | Freq: Every day | ORAL | Status: DC
  Administered 2022-07-30 – 2022-08-03 (×5): 20 mg
  Filled 2022-07-30 (×5): qty 1

## 2022-07-30 MED ORDER — DEXTROSE 50 % IV SOLN: 0.0000 mL | INTRAVENOUS | Status: DC | PRN

## 2022-07-30 MED ORDER — DEXTROSE IN LACTATED RINGERS 5 % IV SOLN: INTRAVENOUS | Status: DC

## 2022-07-30 MED ORDER — ORAL CARE MOUTH RINSE
15.0000 mL | OROMUCOSAL | Status: DC
  Administered 2022-07-30 – 2022-08-06 (×82): 15 mL via OROMUCOSAL

## 2022-07-30 MED ORDER — INSULIN REGULAR(HUMAN) IN NACL 100-0.9 UT/100ML-% IV SOLN
INTRAVENOUS | Status: DC
  Administered 2022-07-30: 5.5 [IU]/h via INTRAVENOUS
  Administered 2022-07-30: 13 [IU]/h via INTRAVENOUS
  Administered 2022-07-30: 15 [IU]/h via INTRAVENOUS
  Filled 2022-07-30 (×3): qty 100

## 2022-07-30 MED ORDER — NOREPINEPHRINE 16 MG/250ML-% IV SOLN
0.0000 ug/min | INTRAVENOUS | Status: DC
  Administered 2022-07-30: 5 ug/min via INTRAVENOUS
  Filled 2022-07-30: qty 250

## 2022-07-30 MED ORDER — INSULIN DETEMIR 100 UNIT/ML ~~LOC~~ SOLN
38.0000 [IU] | Freq: Two times a day (BID) | SUBCUTANEOUS | Status: DC
  Administered 2022-07-30 – 2022-07-31 (×2): 38 [IU] via SUBCUTANEOUS
  Filled 2022-07-30 (×3): qty 0.38

## 2022-07-30 MED ORDER — POTASSIUM CHLORIDE 10 MEQ/100ML IV SOLN: 10.0000 meq | INTRAVENOUS | Status: DC

## 2022-07-30 MED ORDER — INSULIN ASPART 100 UNIT/ML IJ SOLN
3.0000 [IU] | INTRAMUSCULAR | Status: DC
  Administered 2022-07-31: 6 [IU] via SUBCUTANEOUS
  Administered 2022-07-31: 9 [IU] via SUBCUTANEOUS
  Administered 2022-07-31 (×2): 6 [IU] via SUBCUTANEOUS

## 2022-07-30 MED ORDER — SODIUM CHLORIDE 0.9% FLUSH
10.0000 mL | Freq: Two times a day (BID) | INTRAVENOUS | Status: DC
  Administered 2022-07-31 – 2022-08-05 (×12): 10 mL
  Administered 2022-08-06: 30 mL

## 2022-07-30 MED ORDER — POTASSIUM CHLORIDE 10 MEQ/50ML IV SOLN
10.0000 meq | INTRAVENOUS | Status: AC
  Administered 2022-07-30 (×3): 10 meq via INTRAVENOUS
  Filled 2022-07-30: qty 50

## 2022-07-30 MED ORDER — PERFLUTREN LIPID MICROSPHERE
1.0000 mL | INTRAVENOUS | Status: DC | PRN
  Administered 2022-07-30: 2 mL via INTRAVENOUS

## 2022-07-30 MED ORDER — ORAL CARE MOUTH RINSE: 15.0000 mL | OROMUCOSAL | Status: DC | PRN

## 2022-07-30 MED ORDER — SODIUM CHLORIDE 0.9% FLUSH: 10.0000 mL | INTRAVENOUS | Status: DC | PRN

## 2022-07-30 NOTE — Progress Notes (Signed)
  Echocardiogram 2D Echocardiogram has been performed.  Bobby Obrien 07/30/2022, 8:25 AM

## 2022-07-30 NOTE — Progress Notes (Signed)
   Patient Name: Bobby Obrien Date of Encounter: 07/30/2022 [place] Cardiologist: Janina Mayo, MD   Interval Summary   Intubated and sedated  Vital Signs   Vitals:   07/30/22 0813 07/30/22 0900 07/30/22 1000 07/30/22 1100  BP:      Pulse: 85 84 82 82  Resp: (!) 26 (!) 26 19 (!) 24  Temp:  99.7 F (37.6 C) 99.5 F (37.5 C) 99.1 F (37.3 C)  SpO2: 100% 99% 99% 98%  Weight:      Height:        Intake/Output Summary (Last 24 hours) at 07/30/2022 1147 Last data filed at 07/30/2022 1100 Gross per 24 hour  Intake 3377.8 ml  Output 1490 ml  Net 1887.8 ml      07/30/2022    5:00 AM 07/29/2022   11:25 PM 08/08/2021    1:15 PM  Last 3 Weights  Weight (lbs) 193 lb 9 oz 193 lb 9 oz 193 lb  Weight (kg) 87.8 kg 87.8 kg 87.544 kg      Telemetry/ECG    NSR, sinus tach, artifact.  No sustained arrhythmias - Personally Reviewed  Physical Exam  GEN: No acute distress.   Neck: No JVD Cardiac: RRR, no murmurs, rubs, or gallops.  Respiratory: Clear to auscultation bilaterally. GI: Soft, nontender, non-distended  MS: No edema  Assessment & Plan    Cardiac arrest:    Recorded rhythm in the ED was PEA.  He was shocked in the field and seemed through the records to have down time of about 1 hour with intermittent pulses and prolonged CPR.  Though shocked there is not confirmation of shockable rhythm.    He would need an ischemia evaluation with cath pending neuro recovery.      Cardiomyopathy:  EF is 20%.  (Mild MR.  Mitral gradient readings likely in error.)  Distant echo in 2007 had EF 50 - 55%.    Possibly secondary rather than primary but not able to know this.   No recent echo prior to this one.    Continue supportive care.   Hypotension does not allow med titration.   AKI:  Follow creat.  Likely ATN post arrest.    Acute respiratory failure:  Intubated per CCM.     For questions or updates, please contact [place] Please consult [email or web address] for  contact info under        Signed, Minus Breeding, MD

## 2022-07-30 NOTE — Progress Notes (Signed)
EEG complete - results pending 

## 2022-07-30 NOTE — Procedures (Signed)
Arterial Catheter Insertion Procedure Note  Bobby Obrien  [id number]  November 18, 1960  Date:07/30/22  Time:1:00 AM    Provider Performing: Mick Sell    Procedure: Insertion of Arterial Line [phone]) with US guidance [id number])   Indication(s) Blood pressure monitoring and/or need for frequent ABGs  Consent Risks of the procedure as well as the alternatives and risks of each were explained to the patient and/or caregiver.  Consent for the procedure was obtained and is signed in the bedside chart  Anesthesia None   Time Out Verified patient identification, verified procedure, site/side was marked, verified correct patient position, special equipment/implants available, medications/allergies/relevant history reviewed, required imaging and test results available.   Sterile Technique Maximal sterile technique including full sterile barrier drape, hand hygiene, sterile gown, sterile gloves, mask, hair covering, sterile ultrasound probe cover (if used).   Procedure Description Area of catheter insertion was cleaned with chlorhexidine and draped in sterile fashion. With real-time ultrasound guidance an arterial catheter was placed into the left radial artery.  Appropriate arterial tracings confirmed on monitor.     Complications/Tolerance None; patient tolerated the procedure well.   EBL Minimal   Specimen(s) None  JD Rexene Agent [facility] 07/30/2022, 1:00 AM  Please see [email or web address] for pager details.  From 7A-7P if no response, please call [phone]. After hours, please call ELink [phone].

## 2022-07-30 NOTE — H&P (Signed)
NAME:  Bobby Obrien, MRN:  [id number], DOB:  1960-06-25, LOS: 1 ADMISSION DATE:  07/30/2022, CONSULTATION DATE:  3/23 REFERRING MD:  Dr. Darl Householder, CHIEF COMPLAINT:  cardiac arrest   History of Present Illness:  Patient is a 62 year old male with pertinent PMH previous DVT/PE in 2005 not currently on coumadin, CHF, HLD, prostate cancer, SVT, T2DM presents to [facility] ED on 3/23 cardiac arrest.  On 3/23 patient was at a dinner doing well. Was dancing and got tired drinking a lot of water. Then had witnessed arrest around 83.  EMS arrived and started CPR.  Had intermittent ROSC.  Longest ROSC about 10 minutes.  EMS reports patient's rhythm would go into V. tach that requiring multiple shocks.  Per EMS EKG showing possible STEMI.  Patient given amio, mag, fluids and transported to [facility] ED.  Upon arrival to [facility] ED patient without pulses PEA arrest and CPR resumed.  After ROSC patient with sats 50s to 60s.  Thought to have issues with ETT position and patient reintubated. Patient had another PEA arrest while in ED and ROSC achieved. Patient arrested 5 different times with estimated total ROSC at least 30 minutes. Patient was coughing/gagging against ETT and was started on some sedation; not following commands. Hypotensive started on epi and levo. Bicarb drip started. CXR showing extensive b/l infiltrate vs. Pulm edema. EKG no ST elevation; prolonged qtc. K 2.8, mag 3.4, glucose 410, creat 1.77, calcium 8.5, AG 19, co2 13, wbc 12.4, troponin 563, bnp 77, ast 411, alt 478. Cards consulted. PCCM consulted for icu admission.  Pertinent  Medical History   Past Medical History:  Diagnosis Date   BPH associated with nocturia    History of asthma    childhood   History of CHF (congestive heart failure) 04/2004   in setting PE   History of pulmonary embolism 04/2004   with HF and hypertension;  bilateral lower lobe,  completed coumadin   (07-08-2021  pt stated never had clots before and none since, pt had negative  work-up for blood disorder at that time)   Hyperlipidemia    Hypertension    followed by pcp   (per cardiology note in epic 0302-2023 nuclear stress test 07-27-2004 normal  and echo 07-27-2005 (ef 50-55%)   Malignant neoplasm prostate ([place]) 01/2021   urologist--- dr wrenn/  radiation oncology-- dr Tammi Klippel;    dx 09/ 2022  Gleason 3+4, PSA 10.5   Panic disorder    Paroxysmal SVT (supraventricular tachycardia) ([place])    nonsustained;   ED visit 12-26-2020  SVT heart rate 2013 x2 doses adenosine;  followed up with pcp was referred to cardiology at the time but pt had no visit;  pt needed pcp clearance prior to surgery on 07-08-2021 and was referred to cardiology, office visit w/ cardiologist-- dr Stanton Kidney branch 07-07-2021, was cleared for surgery, pt to started taking toprol   Tachycardia    Type 2 diabetes mellitus ([place])    followed by pcp   (03-03-2023n pt stated checks blood sugar once daily , fasting (130s) and not fasting (165-170)   Wears partial dentures    upper     Significant Hospital Events: Including procedures, antibiotic start and stop dates in addition to other pertinent events   3/23 vtach arrest; ROSC about 30 minutes?; pccm consulted   Interim History / Subjective:  Remains on pressors, sedation, insulin gtt.  Objective   Blood pressure 99/79, pulse 84, temperature 99.7 F (37.6 C), resp. rate (!) 26, height  6' (1.829 m), weight 87.8 kg, SpO2 99 %. CVP:  [5 mmHg-9 mmHg] 9 mmHg  Vent Mode: PRVC FiO2 (%):  [60 %-100 %] 60 % Set Rate:  [22 bmp-26 bmp] 26 bmp Vt Set:  [460 mL-620 mL] 460 mL PEEP:  [10 cmH20-16 cmH20] 10 cmH20 Plateau Pressure:  [15 cmH20-28 cmH20] 15 cmH20   Intake/Output Summary (Last 24 hours) at 07/30/2022 0908 Last data filed at 07/30/2022 0900 Gross per 24 hour  Intake 2745.28 ml  Output 1140 ml  Net 1605.28 ml   Filed Weights   07/30/2022 2325 07/30/22 0500  Weight: 87.8 kg 87.8 kg    Examination:  General - sedated Eyes - pupils pinpoint  and reactive ENT - ETT in place Cardiac - regular rate/rhythm, no murmur Chest - scattered rhonchi Abdomen - soft, non tender, + bowel sounds Extremities - no cyanosis, clubbing, or edema Skin - no rashes Neuro - RASS -2  Resolved Hospital Problem list   Anion gap metabolic acidosis with lactic acidosis.  Assessment & Plan:   Cardiogenic shock in setting of cardiac arrest. VT arrest in setting of hypokalemia. Hx of HFpEF, SVT. - f/u Echo - pressors to keep MAP > 65 - hold outpt lisinopril, HCTZ  Acute hypoxic respiratory failure from aspiration pneumonitis and acute pulmonary edema. - full vent support - goal Vt 6 cc/kg - goal SpO2 > 90% - goal Pplat < 30 - f/u CXR, ABG - day 2 of unasyn  Acute metabolic encephalopathy 2nd to hypoxia, DKA. - EEG negative - initial CT head normal - RASS goal 0 to -1  AKI from ATN 2nd to shock, and DKA. Hypokalemia. - monitor urine outpt - f/u BMET, electrolyes  DKA. - insulin gtt - hold farxiga ?if this contributed to DKA  Best Practice (right click and "Reselect all SmartList Selections" daily)   Diet/type: NPO w/ meds via tube DVT prophylaxis: SCD; check CT head no bleed can start DVT PPx GI prophylaxis: H2B Lines: N/A Foley:  Yes, and it is still needed Code Status:  full code Last date of multidisciplinary goals of care discussion [updated his wife at bedside]  Labs       Latest Ref Rng & Units 07/30/2022    7:07 AM 07/30/2022    3:40 AM 07/30/2022    1:10 AM  CMP  Glucose 70 - 99 mg/dL 435   594   BUN 8 - 23 mg/dL 22   20   Creatinine 0.61 - 1.24 mg/dL 2.38   2.17   Sodium 135 - 145 mmol/L 134  134  131   Potassium 3.5 - 5.1 mmol/L 3.3  3.6  4.0   Chloride 98 - 111 mmol/L 98   98   CO2 22 - 32 mmol/L 21   14   Calcium 8.9 - 10.3 mg/dL 7.9   8.0   Total Protein 6.5 - 8.1 g/dL   5.6   Total Bilirubin 0.3 - 1.2 mg/dL   1.2   Alkaline Phos 38 - 126 U/L   87   AST 15 - 41 U/L   659   ALT 0 - 44 U/L   555         Latest Ref Rng & Units 07/30/2022    3:40 AM 07/30/2022    1:10 AM 07/30/2022   12:57 AM  CBC  WBC 4.0 - 10.5 K/uL  13.3    Hemoglobin 13.0 - 17.0 g/dL 16.0  16.1  16.3   Hematocrit 39.0 -  52.0 % 47.0  47.0  48.0   Platelets 150 - 400 K/uL  222      ABG    Component Value Date/Time   PHART 7.305 (L) 07/30/2022 0340   PCO2ART 32.6 07/30/2022 0340   PO2ART 158 (H) 07/30/2022 0340   HCO3 16.2 (L) 07/30/2022 0340   TCO2 17 (L) 07/30/2022 0340   ACIDBASEDEF 9.0 (H) 07/30/2022 0340   O2SAT 99 07/30/2022 0340    CBG (last 3)  Recent Labs    07/30/22 0644 07/30/22 0741 07/30/22 0836  GLUCAP 388* 420* 308*    Critical care time: 42 minutes  Chesley Mires, MD [place] Pager - [phone] or [phone] 07/30/2022, 9:24 AM

## 2022-07-30 NOTE — Procedures (Signed)
Patient Name: Bobby Obrien  MRN: [id number]  Epilepsy Attending: Lora Havens  Referring Physician/Provider: Mick Sell, PA-C  Date: 07/30/2022 Duration: 26.06 mins  Patient history: 61yo M s/p cardiac arrest. EEG to evaluate for seizure  Level of alertness: comatose  AEDs during EEG study: None  Technical aspects: This EEG study was done with scalp electrodes positioned according to the 10-20 International system of electrode placement. Electrical activity was reviewed with band pass filter of 1-70Hz , sensitivity of 7 uV/mm, display speed of 100mm/sec with a 60Hz  notched filter applied as appropriate. EEG data were recorded continuously and digitally stored.  Video monitoring was available and reviewed as appropriate.  Description: EEG showed continuous generalized background suppression, not reactive to stimulation. Hyperventilation and photic stimulation were not performed.     ABNORMALITY -Background suppression, generalized  IMPRESSION: This study is suggestive of profound diffuse encephalopathy, nonspecific etiology but with history of cardiac arrest, could be secondary to anoxic/hypoxic brain injury. No seizures or epileptiform discharges were seen throughout the recording.  [name]

## 2022-07-30 NOTE — Procedures (Signed)
Central Venous Catheter Insertion Procedure Note  Bobby Obrien  [id number]  [birth year]  Date:07/30/22  Time:12:59 AM   Provider Performing:Eduin Friedel D Rollene Rotunda   Procedure: Insertion of Non-tunneled Central Venous [phone]) with US guidance [id number])   Indication(s) Medication administration  Consent Risks of the procedure as well as the alternatives and risks of each were explained to the patient and/or caregiver.  Consent for the procedure was obtained and is signed in the bedside chart  Anesthesia Topical only with 1% lidocaine   Timeout Verified patient identification, verified procedure, site/side was marked, verified correct patient position, special equipment/implants available, medications/allergies/relevant history reviewed, required imaging and test results available.  Sterile Technique Maximal sterile technique including full sterile barrier drape, hand hygiene, sterile gown, sterile gloves, mask, hair covering, sterile ultrasound probe cover (if used).  Procedure Description Area of catheter insertion was cleaned with chlorhexidine and draped in sterile fashion.  With real-time ultrasound guidance a central venous catheter was placed into the left internal jugular vein. Nonpulsatile blood flow and easy flushing noted in all ports.  The catheter was sutured in place and sterile dressing applied.  Complications/Tolerance None; patient tolerated the procedure well. Chest X-ray is ordered to verify placement for internal jugular or subclavian cannulation.   Chest x-ray is not ordered for femoral cannulation.  EBL Minimal  Specimen(s) None  JD Rexene Agent [facility] 07/30/2022, 12:59 AM  Please see [email or web address] for pager details.  From 7A-7P if no response, please call [phone]. After hours, please call ELink [phone].

## 2022-07-31 ENCOUNTER — Inpatient Hospital Stay (HOSPITAL_COMMUNITY): Payer: Medicaid Other

## 2022-07-31 LAB — POCT I-STAT 7, (LYTES, BLD GAS, ICA,H+H)
Acid-Base Excess: 3 mmol/L — ABNORMAL HIGH (ref 0.0–2.0)
Bicarbonate: 27.8 mmol/L (ref 20.0–28.0)
Calcium, Ion: 1.11 mmol/L — ABNORMAL LOW (ref 1.15–1.40)
HCT: 39 % (ref 39.0–52.0)
Hemoglobin: 13.3 g/dL (ref 13.0–17.0)
O2 Saturation: 98 %
Patient temperature: 36.1
Potassium: 3.9 mmol/L (ref 3.5–5.1)
Sodium: 137 mmol/L (ref 135–145)
TCO2: 29 mmol/L (ref 22–32)
pCO2 arterial: 40.8 mmHg (ref 32–48)
pH, Arterial: 7.438 (ref 7.35–7.45)
pO2, Arterial: 97 mmHg (ref 83–108)

## 2022-07-31 LAB — BASIC METABOLIC PANEL
Anion gap: 7 (ref 5–15)
BUN: 19 mg/dL (ref 8–23)
CO2: 23 mmol/L (ref 22–32)
Calcium: 6.5 mg/dL — ABNORMAL LOW (ref 8.9–10.3)
Chloride: 107 mmol/L (ref 98–111)
Creatinine, Ser: 1.5 mg/dL — ABNORMAL HIGH (ref 0.61–1.24)
GFR, Estimated: 52 mL/min — ABNORMAL LOW (ref >60)
Glucose, Bld: 192 mg/dL — ABNORMAL HIGH (ref 70–99)
Potassium: 3.4 mmol/L — ABNORMAL LOW (ref 3.5–5.1)
Sodium: 137 mmol/L (ref 135–145)

## 2022-07-31 LAB — GLUCOSE, CAPILLARY
Glucose-Capillary: 107 mg/dL — ABNORMAL HIGH (ref 70–99)
Glucose-Capillary: 130 mg/dL — ABNORMAL HIGH (ref 70–99)
Glucose-Capillary: 146 mg/dL — ABNORMAL HIGH (ref 70–99)
Glucose-Capillary: 171 mg/dL — ABNORMAL HIGH (ref 70–99)
Glucose-Capillary: 173 mg/dL — ABNORMAL HIGH (ref 70–99)
Glucose-Capillary: 183 mg/dL — ABNORMAL HIGH (ref 70–99)
Glucose-Capillary: 211 mg/dL — ABNORMAL HIGH (ref 70–99)

## 2022-07-31 LAB — CBC
HCT: 40.3 % (ref 39.0–52.0)
Hemoglobin: 13.6 g/dL (ref 13.0–17.0)
MCH: 31.6 pg (ref 26.0–34.0)
MCHC: 33.7 g/dL (ref 30.0–36.0)
MCV: 93.7 fL (ref 80.0–100.0)
Platelets: 137 10*3/uL — ABNORMAL LOW (ref 150–400)
RBC: 4.3 MIL/uL (ref 4.22–5.81)
RDW: 12 % (ref 11.5–15.5)
WBC: 13.8 10*3/uL — ABNORMAL HIGH (ref 4.0–10.5)
nRBC: 0 % (ref 0.0–0.2)

## 2022-07-31 LAB — PHOSPHORUS: Phosphorus: 3.2 mg/dL (ref 2.5–4.6)

## 2022-07-31 LAB — MAGNESIUM: Magnesium: 1.6 mg/dL — ABNORMAL LOW (ref 1.7–2.4)

## 2022-07-31 LAB — LACTIC ACID, PLASMA: Lactic Acid, Venous: 2 mmol/L (ref 0.5–1.9)

## 2022-07-31 MED ORDER — PROSOURCE TF20 ENFIT COMPATIBL EN LIQD
60.0000 mL | Freq: Every day | ENTERAL | Status: DC
  Administered 2022-07-31 – 2022-08-06 (×7): 60 mL
  Filled 2022-07-31 (×7): qty 60

## 2022-07-31 MED ORDER — VITAL 1.5 CAL PO LIQD
1000.0000 mL | ORAL | Status: DC
  Administered 2022-07-31 – 2022-08-05 (×5): 1000 mL

## 2022-07-31 MED ORDER — VITAL HIGH PROTEIN PO LIQD: 1000.0000 mL | ORAL | Status: DC

## 2022-07-31 MED ORDER — POTASSIUM CHLORIDE 20 MEQ PO PACK
40.0000 meq | PACK | Freq: Once | ORAL | Status: AC
  Administered 2022-07-31: 40 meq
  Filled 2022-07-31: qty 2

## 2022-07-31 MED ORDER — MAGNESIUM SULFATE 4 GM/100ML IV SOLN
4.0000 g | Freq: Once | INTRAVENOUS | Status: AC
  Administered 2022-07-31: 4 g via INTRAVENOUS
  Filled 2022-07-31: qty 100

## 2022-07-31 MED ORDER — INSULIN ASPART 100 UNIT/ML IJ SOLN
0.0000 [IU] | INTRAMUSCULAR | Status: DC
  Administered 2022-07-31: 2 [IU] via SUBCUTANEOUS
  Administered 2022-08-01 (×2): 5 [IU] via SUBCUTANEOUS
  Administered 2022-08-01: 2 [IU] via SUBCUTANEOUS
  Administered 2022-08-01: 5 [IU] via SUBCUTANEOUS
  Administered 2022-08-01: 2 [IU] via SUBCUTANEOUS
  Administered 2022-08-01 – 2022-08-02 (×2): 5 [IU] via SUBCUTANEOUS
  Administered 2022-08-02 – 2022-08-03 (×7): 3 [IU] via SUBCUTANEOUS
  Administered 2022-08-03: 2 [IU] via SUBCUTANEOUS
  Administered 2022-08-03: 3 [IU] via SUBCUTANEOUS
  Administered 2022-08-03 (×3): 5 [IU] via SUBCUTANEOUS
  Administered 2022-08-04 (×3): 3 [IU] via SUBCUTANEOUS

## 2022-07-31 NOTE — Progress Notes (Signed)
Initial Nutrition Assessment  DOCUMENTATION CODES:  Not applicable  INTERVENTION:  Initiate tube feeding via OGT: Vital 1.5 at 60ml/h (1320 ml per day) Start at 37mL/h and advance by 68mL q8h to goal of 56mL/h Prosource TF20 60 ml 1x/d Provides 2060 kcal, 109 gm protein, 1008 ml free water daily  NUTRITION DIAGNOSIS:  Inadequate oral intake related to inability to eat as evidenced by NPO status.  GOAL:  Patient will meet greater than or equal to 90% of their needs  MONITOR:  TF tolerance, I & O's, Labs, Vent status  REASON FOR ASSESSMENT:  Consult, Ventilator Enteral/tube feeding initiation and management  ASSESSMENT:  Pt with hx of DM type 2, HTN, HLD, CHF, and prostate cancer presented to ED after suffering a cardiac arrest. Pt received multiple shocks and underwent CPR for almost an hour   Patient is currently intubated on ventilator support. Several family members present at bedside at the time of visit able to give a nutrition hx. Pt currently off of all drips and sedation.   Family reports a good appetite at baseline and that weight has been stable. Well nourished on exam. TF initiation requested. Will   MV: 10.2 L/min Temp (24hrs), Avg:97.2 F (36.2 C), Min:95.9 F (35.5 C), Max:98.1 F (36.7 C)   Intake/Output Summary (Last 24 hours) at 07/31/2022 1511 Last data filed at 07/31/2022 1400 Gross per 24 hour  Intake 4062.27 ml  Output 1605 ml  Net 2457.27 ml  Net IO Since Admission: 5,362.02 mL [07/31/22 1511]  Nutritionally Relevant Medications: Scheduled Meds:  docusate  100 mg Per Tube BID   famotidine  20 mg Per Tube Daily   PROSource TF20  60 mL Per Tube Daily   VITAL HIGH PROTEIN  1,000 mL Per Tube Q24H   insulin aspart  3-9 Units Subcutaneous Q4H   insulin detemir  38 Units Subcutaneous Q12H   polyethylene glycol  17 g Per Tube Daily   Continuous Infusions:  ampicillin-sulbactam (UNASYN) IV Stopped (07/31/22 0814)   norepinephrine (LEVOPHED) Adult  infusion Stopped (07/31/22 0007)   propofol (DIPRIVAN) infusion     PRN Meds: docusate, polyethylene glycol  Labs Reviewed: Creatinine 1.5 Mg 1.6  NUTRITION - FOCUSED PHYSICAL EXAM: Flowsheet Row Most Recent Value  Orbital Region No depletion  Upper Arm Region No depletion  Thoracic and Lumbar Region No depletion  Buccal Region No depletion  Temple Region No depletion  Clavicle Bone Region No depletion  Clavicle and Acromion Bone Region No depletion  Scapular Bone Region No depletion  Dorsal Hand No depletion  [edema noted to the right forearm/hand]  Patellar Region No depletion  Anterior Thigh Region No depletion  Posterior Calf Region No depletion  Edema (RD Assessment) Mild  Hair Reviewed  Eyes Reviewed  Mouth Reviewed  Skin Reviewed  Nails Reviewed   Diet Order:   Diet Order             Diet NPO time specified  Diet effective now                   EDUCATION NEEDS:  Not appropriate for education at this time  Skin:  Skin Assessment: Reviewed RN Assessment  Last BM:  unsure  Height:  Ht Readings from Last 1 Encounters:  07/29/22 6' (1.829 m)    Weight:  Wt Readings from Last 1 Encounters:  07/31/22 89.3 kg   Ideal Body Weight:  80.9 kg  BMI:  Body mass index is 26.7 kg/m.  Estimated Nutritional  Needs:  Kcal:  2000-2300 kcal/d Protein:  100-120g/d Fluid:  2L/d    Ranell Patrick, RD, LDN Clinical Dietitian RD pager # available in [place]  After hours/weekend pager # available in [facility]

## 2022-07-31 NOTE — Progress Notes (Signed)
   Patient Name: Bobby Obrien Date of Encounter: 07/31/2022 [place] Cardiologist: Janina Mayo, MD   Interval Summary   Intubated.  Sedation off this morning and non responsive.   Vital Signs   Vitals:   07/31/22 0800 07/31/22 0813 07/31/22 0900 07/31/22 1000  BP: 117/68 (!) 102/56 110/73 112/78  Pulse: 82 80 83 82  Resp: (!) 24 (!) 24 (!) 24 (!) 24  Temp: (!) 97.2 F (36.2 C)  (!) 96.8 F (36 C) (!) 96.8 F (36 C)  TempSrc: Esophageal   Esophageal  SpO2: 100% 100% 100% 100%  Weight:      Height:        Intake/Output Summary (Last 24 hours) at 07/31/2022 1005 Last data filed at 07/31/2022 1000 Gross per 24 hour  Intake 5467.97 ml  Output 2280 ml  Net 3187.97 ml      07/31/2022    7:01 AM 07/30/2022    5:00 AM 07/29/2022   11:25 PM  Last 3 Weights  Weight (lbs) 196 lb 13.9 oz 193 lb 9 oz 193 lb 9 oz  Weight (kg) 89.3 kg 87.8 kg 87.8 kg      Telemetry/ECG    NSR, NSVT, no sustained arrhythmias- Personally Reviewed  Physical Exam   GEN: No  acute distress.   Neck: No  JVD Cardiac: RRR, no murmurs, rubs, or gallops.  Respiratory: Clear   to auscultation bilaterally. GI: Soft, nontender, non-distended, normal bowel sounds  MS:  Right arm edema; No deformity.   Assessment & Plan    Cardiac arrest:    Recorded rhythm in the ED was PEA.  He was shocked in the field and seemed through the records to have down time of about 1 hour with intermittent pulses and prolonged CPR.  Though shocked there is not confirmation of shockable rhythm.      Cath if he recovers neurologically.  Cardiomyopathy:  EF is 20%.  (Mild MR.  Mitral gradient readings likely in error.)  Distant echo in 2007 had EF 50 - 55%.    Possibly secondary rather than primary but not able to know this.   No recent echo prior to this one.    Off pressors now.  Can begin to titrate meds tomorrow if BP allows.    AKI:  Follow creat.   Trending down.   Likely ATN post arrest.    Acute  respiratory failure: Per CCM.     For questions or updates, please contact [place] Please consult [email or web address] for contact info under        Signed, Minus Breeding, MD

## 2022-07-31 NOTE — Progress Notes (Signed)
eLink Physician-Brief Progress Note Patient Name: Bobby Obrien DOB: Dec 24, 1960 MRN: [id number]   Date of Service  07/31/2022  HPI/Events of Note  Notified of need for insulin orders.  Pt is on tube feeds.   eICU Interventions  Insulin sliding scale ordered q4hrs.     Intervention Category Intermediate Interventions: Hyperglycemia - evaluation and treatment  Elsie Lincoln 07/31/2022, 7:56 PM

## 2022-07-31 NOTE — Progress Notes (Signed)
[facility] ADULT ICU REPLACEMENT PROTOCOL   The patient does apply for the [facility] Adult ICU Electrolyte Replacment Protocol based on the criteria listed below:   1.Exclusion criteria: TCTS, ECMO, Dialysis, and Myasthenia Gravis patients 2. Is GFR >/= 30 ml/min? Yes.    Patient's GFR today is 52 3. Is SCr </= 2? Yes.   Patient's SCr is 1.50 mg/dL 4. Did SCr increase >/= 0.5 in 24 hours? No. 5.Pt's weight >40kg  Yes.   6. Abnormal electrolyte(s): K, Mag  7. Electrolytes replaced per protocol 8.  Call MD STAT for K+ </= 2.5, Phos </= 1, or Mag </= 1 Physician:  Sherlene Shams [facility] 07/31/2022 5:20 AM

## 2022-07-31 NOTE — Progress Notes (Addendum)
NAME:  Bobby Obrien, MRN:  [id number], DOB:  Jan 12, 1961, LOS: 2 ADMISSION DATE:  07/11/2022, CONSULTATION DATE:  3/23 REFERRING MD:  Dr. Darl Householder, CHIEF COMPLAINT:  cardiac arrest   History of Present Illness:  Patient is a 62 year old male with pertinent PMH previous DVT/PE in 2005 not currently on coumadin, CHF, HLD, prostate cancer, SVT, T2DM presents to [facility] ED on 3/23 cardiac arrest.  On 3/23 patient was at a dinner doing well. Was dancing and got tired drinking a lot of water. Then had witnessed arrest around 11.  EMS arrived and started CPR.  Had intermittent ROSC.  Longest ROSC about 10 minutes.  EMS reports patient's rhythm would go into V. tach that requiring multiple shocks.  Per EMS EKG showing possible STEMI.  Patient given amio, mag, fluids and transported to [facility] ED.  Upon arrival to [facility] ED patient without pulses PEA arrest and CPR resumed.  After ROSC patient with sats 50s to 60s.  Thought to have issues with ETT position and patient reintubated. Patient had another PEA arrest while in ED and ROSC achieved. Patient arrested 5 different times with estimated total ROSC at least 30 minutes. Patient was coughing/gagging against ETT and was started on some sedation; not following commands. Hypotensive started on epi and levo. Bicarb drip started. CXR showing extensive b/l infiltrate vs. Pulm edema. EKG no ST elevation; prolonged qtc. K 2.8, mag 3.4, glucose 410, creat 1.77, calcium 8.5, AG 19, co2 13, wbc 12.4, troponin 563, bnp 77, ast 411, alt 478. Cards consulted. PCCM consulted for icu admission.  Pertinent  Medical History   Past Medical History:  Diagnosis Date   BPH associated with nocturia    History of asthma    childhood   History of CHF (congestive heart failure) 04/2004   in setting PE   History of pulmonary embolism 04/2004   with HF and hypertension;  bilateral lower lobe,  completed coumadin   (07-08-2021  pt stated never had clots before and none since, pt had negative  work-up for blood disorder at that time)   Hyperlipidemia    Hypertension    followed by pcp   (per cardiology note in epic 0302-2023 nuclear stress test 07-27-2004 normal  and echo 07-27-2005 (ef 50-55%)   Malignant neoplasm prostate ([place]) 01/2021   urologist--- dr wrenn/  radiation oncology-- dr Tammi Klippel;    dx 09/ 2022  Gleason 3+4, PSA 10.5   Panic disorder    Paroxysmal SVT (supraventricular tachycardia) ([place])    nonsustained;   ED visit 12-26-2020  SVT heart rate 2013 x2 doses adenosine;  followed up with pcp was referred to cardiology at the time but pt had no visit;  pt needed pcp clearance prior to surgery on 07-08-2021 and was referred to cardiology, office visit w/ cardiologist-- dr Stanton Kidney branch 07-07-2021, was cleared for surgery, pt to started taking toprol   Tachycardia    Type 2 diabetes mellitus ([place])    followed by pcp   (03-03-2023n pt stated checks blood sugar once daily , fasting (130s) and not fasting (165-170)   Wears partial dentures    upper     Significant Hospital Events: Including procedures, antibiotic start and stop dates in addition to other pertinent events   3/23 vtach arrest; ROSC about 30 minutes?; pccm consulted   Interim History / Subjective:   Remains on the ventilator, insulin drip, norepinephrine  Objective   Blood pressure (!) 102/56, pulse 80, temperature (!) 97.2 F (36.2 C), temperature  source Esophageal, resp. rate (!) 24, height 6' (1.829 m), weight 87.8 kg, SpO2 100 %. CVP:  [9 mmHg-12 mmHg] 11 mmHg  Vent Mode: PRVC FiO2 (%):  [40 %-55 %] 40 % Set Rate:  [24 bmp] 24 bmp Vt Set:  [460 mL] 460 mL PEEP:  [5 cmH20-8 cmH20] 5 cmH20 Plateau Pressure:  [15 cmH20-20 cmH20] 19 cmH20   Intake/Output Summary (Last 24 hours) at 07/31/2022 [id number] Last data filed at 07/31/2022 0700 Gross per 24 hour  Intake 4158.12 ml  Output 2150 ml  Net 2008.12 ml   Filed Weights   07/16/2022 2325 07/30/22 0500  Weight: 87.8 kg 87.8 kg    Examination: Blood  pressure (!) 102/56, pulse 80, temperature (!) 97.2 F (36.2 C), temperature source Esophageal, resp. rate (!) 24, height 6' (1.829 m), weight 87.8 kg, SpO2 100 %. Gen:      No acute distress HEENT:  EOMI, sclera anicteric Neck:     No masses; no thyromegaly, ETT Lungs:    Clear to auscultation bilaterally; normal respiratory effort CV:         Regular rate and rhythm; no murmurs Abd:      + bowel sounds; soft, non-tender; no palpable masses, no distension Ext:    No edema; adequate peripheral perfusion Skin:      Warm and dry; no rash Neuro: Sedated, unresponsive  Labs/imaging reviewed Significant for sodium 137, BUN/creatinine 19/1.50, AST 659, ALT 555 WBC 13.8, hemoglobin 13.6, platelets 137  Resolved Hospital Problem list   Anion gap metabolic acidosis with lactic acidosis.  Assessment & Plan:   Cardiogenic shock in setting of cardiac arrest. VT arrest in setting of hypokalemia. Hx of HFpEF, SVT. Echo reviewed with EF 0000000, grade 1 diastolic dysfunction.  Cardiology is on board May need cardiac ischemic evaluation if his neurostatus improves.  Acute hypoxic respiratory failure from aspiration pneumonitis and acute pulmonary edema. Continue vent support, low tidal volume ventilation Goal plateau pressure less than 30 Follow intermittent chest x-ray Continue Unasyn day 3  Acute metabolic encephalopathy 2nd to hypoxia, DKA. Concern for anoxic injury - EEG negative - initial CT head normal Monitor neurostatus  AKI from ATN 2nd to shock, and DKA. Hypokalemia. - monitor urine outpt - f/u BMET, electrolyes  Elevated LFTs Maybe from shock liver. Monitor labs  DKA. Continue insulin drip per protocol - hold farxiga ?if this contributed to DKA  Best Practice (right click and "Reselect all SmartList Selections" daily)   Diet/type: NPO w/ meds via tube DVT prophylaxis: prophylactic heparin ; GI prophylaxis: H2B Lines: Central line and Arterial Line Foley:  Yes, and  it is still needed Code Status:  full code Last date of multidisciplinary goals of care discussion [updated his wife at bedside]  Critical care time:    The patient is critically ill with multiple organ system failure and requires high complexity decision making for assessment and support, frequent evaluation and titration of therapies, advanced monitoring, review of radiographic studies and interpretation of complex data.   Critical Care Time devoted to patient care services, exclusive of separately billable procedures, described in this note is 35 minutes.   Marshell Garfinkel MD [facility] See Amion for pager  If no response to pager , please call [phone] until 7pm After 7:00 pm call Elink  [phone] 07/31/2022, 8:23 AM

## 2022-07-31 NOTE — Progress Notes (Addendum)
On 3/23 patient was at a dinner doing well. Was dancing and got tired drinking a lot of water. Then had witnessed arrest around 60. EMS arrived and started CPR. Had intermittent ROSC. Longest ROSC about 10 minutes. EMS reports patient's rhythm would go into V. tach that requiring multiple shocks. Per EMS EKG showing possible STEMI. Patient currently intubated and non responsive.  TOC consult added for FMLA papers.  This RNCM notified MD and bedside RN that TOC do not do FLMA papers.  TOC following.

## 2022-08-01 ENCOUNTER — Encounter (HOSPITAL_COMMUNITY): Payer: Self-pay

## 2022-08-01 ENCOUNTER — Inpatient Hospital Stay (HOSPITAL_COMMUNITY): Payer: Medicaid Other

## 2022-08-01 LAB — COMPREHENSIVE METABOLIC PANEL
ALT: 205 U/L — ABNORMAL HIGH (ref 0–44)
AST: 158 U/L — ABNORMAL HIGH (ref 15–41)
Albumin: 2.3 g/dL — ABNORMAL LOW (ref 3.5–5.0)
Alkaline Phosphatase: 67 U/L (ref 38–126)
Anion gap: 10 (ref 5–15)
BUN: 30 mg/dL — ABNORMAL HIGH (ref 8–23)
CO2: 25 mmol/L (ref 22–32)
Calcium: 7.9 mg/dL — ABNORMAL LOW (ref 8.9–10.3)
Chloride: 102 mmol/L (ref 98–111)
Creatinine, Ser: 1.65 mg/dL — ABNORMAL HIGH (ref 0.61–1.24)
GFR, Estimated: 46 mL/min — ABNORMAL LOW (ref >60)
Glucose, Bld: 247 mg/dL — ABNORMAL HIGH (ref 70–99)
Potassium: 4.4 mmol/L (ref 3.5–5.1)
Sodium: 137 mmol/L (ref 135–145)
Total Bilirubin: 1.2 mg/dL (ref 0.3–1.2)
Total Protein: 5.4 g/dL — ABNORMAL LOW (ref 6.5–8.1)

## 2022-08-01 LAB — CULTURE, RESPIRATORY W GRAM STAIN: Culture: NORMAL

## 2022-08-01 LAB — GLUCOSE, CAPILLARY
Glucose-Capillary: 149 mg/dL — ABNORMAL HIGH (ref 70–99)
Glucose-Capillary: 188 mg/dL — ABNORMAL HIGH (ref 70–99)
Glucose-Capillary: 203 mg/dL — ABNORMAL HIGH (ref 70–99)
Glucose-Capillary: 217 mg/dL — ABNORMAL HIGH (ref 70–99)
Glucose-Capillary: 230 mg/dL — ABNORMAL HIGH (ref 70–99)
Glucose-Capillary: 233 mg/dL — ABNORMAL HIGH (ref 70–99)
Glucose-Capillary: 234 mg/dL — ABNORMAL HIGH (ref 70–99)

## 2022-08-01 LAB — CBC
HCT: 36.7 % — ABNORMAL LOW (ref 39.0–52.0)
Hemoglobin: 12.5 g/dL — ABNORMAL LOW (ref 13.0–17.0)
MCH: 32.6 pg (ref 26.0–34.0)
MCHC: 34.1 g/dL (ref 30.0–36.0)
MCV: 95.8 fL (ref 80.0–100.0)
Platelets: 121 10*3/uL — ABNORMAL LOW (ref 150–400)
RBC: 3.83 MIL/uL — ABNORMAL LOW (ref 4.22–5.81)
RDW: 12.3 % (ref 11.5–15.5)
WBC: 14.7 10*3/uL — ABNORMAL HIGH (ref 4.0–10.5)
nRBC: 0 % (ref 0.0–0.2)

## 2022-08-01 LAB — HEMOGLOBIN A1C
Hgb A1c MFr Bld: 7.8 % — ABNORMAL HIGH (ref 4.8–5.6)
Mean Plasma Glucose: 177 mg/dL

## 2022-08-01 LAB — LACTIC ACID, PLASMA: Lactic Acid, Venous: 1.7 mmol/L (ref 0.5–1.9)

## 2022-08-01 LAB — MAGNESIUM: Magnesium: 2.4 mg/dL (ref 1.7–2.4)

## 2022-08-01 MED ORDER — LABETALOL HCL 5 MG/ML IV SOLN
10.0000 mg | INTRAVENOUS | Status: DC | PRN
  Administered 2022-08-01 – 2022-08-02 (×4): 10 mg via INTRAVENOUS
  Administered 2022-08-03 (×2): 5 mg via INTRAVENOUS
  Administered 2022-08-04 – 2022-08-06 (×2): 10 mg via INTRAVENOUS
  Filled 2022-08-01 (×8): qty 4

## 2022-08-01 MED ORDER — FUROSEMIDE 10 MG/ML IJ SOLN
40.0000 mg | Freq: Once | INTRAMUSCULAR | Status: AC
  Administered 2022-08-01: 40 mg via INTRAVENOUS
  Filled 2022-08-01: qty 4

## 2022-08-01 MED ORDER — FENTANYL CITRATE PF 50 MCG/ML IJ SOSY
50.0000 ug | PREFILLED_SYRINGE | INTRAMUSCULAR | Status: DC | PRN
  Administered 2022-08-01 – 2022-08-04 (×2): 50 ug via INTRAVENOUS
  Filled 2022-08-01 (×2): qty 1

## 2022-08-01 MED ORDER — AMLODIPINE BESYLATE 5 MG PO TABS
5.0000 mg | ORAL_TABLET | Freq: Every day | ORAL | Status: DC
  Administered 2022-08-01: 5 mg
  Filled 2022-08-01: qty 1

## 2022-08-01 MED ORDER — GADOBUTROL 1 MMOL/ML IV SOLN
10.0000 mL | Freq: Once | INTRAVENOUS | Status: AC | PRN
  Administered 2022-08-01: 10 mL via INTRAVENOUS

## 2022-08-01 MED ORDER — SENNOSIDES 8.8 MG/5ML PO SYRP
10.0000 mL | ORAL_SOLUTION | Freq: Two times a day (BID) | ORAL | Status: DC
  Administered 2022-08-01 – 2022-08-05 (×10): 10 mL
  Filled 2022-08-01 (×11): qty 10

## 2022-08-01 MED ORDER — INSULIN ASPART 100 UNIT/ML IJ SOLN
4.0000 [IU] | INTRAMUSCULAR | Status: DC
  Administered 2022-08-01 – 2022-08-04 (×19): 4 [IU] via SUBCUTANEOUS

## 2022-08-01 MED ORDER — INSULIN GLARGINE-YFGN 100 UNIT/ML ~~LOC~~ SOLN
30.0000 [IU] | Freq: Every day | SUBCUTANEOUS | Status: DC
  Administered 2022-08-01: 30 [IU] via SUBCUTANEOUS
  Filled 2022-08-01 (×2): qty 0.3

## 2022-08-01 NOTE — Progress Notes (Signed)
RT NOTE: RT transported patient on ventilator from 66M to MRI and back to 66M with no complications.

## 2022-08-01 NOTE — Consult Note (Signed)
NEUROLOGY CONSULTATION NOTE   Date of service: August 01, 2022 Patient Name: Bobby Obrien MRN:  [id number] DOB:  1960/09/16 Reason for consult: "R cerebellar and L ICA Strokes on MRI Brain" Requesting Provider: Marshell Garfinkel, MD _ _ _   _ __   _ __ _ _  __ __   _ __   __ _  History of Present Illness  Bobby Obrien is a 62 y.o. male with PMH significant for DVTs/PS not on [facility], CHF, HLD, DM2 p/w out of Hospital witnessed cardiac arrest.  Per notes, witnessed arrest around 1943 on . EMS started CPR, intermittent ROSC with longest ROSC abour 10 mins. CPR resumed in ED and multiple PEA arrests before ROSC with concern fopr prolonged low flow state. He was coughing/gagging on ETT. He was transferred to [facility] ICU.  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 Neurology consulted for further evaluation and workup.  Patient unable to provide any meaningful history 2/2 intubation and comatose state.   ROS   Unable to obtain any ROS 2/2 intubation.  Past History   Past Medical History:  Diagnosis Date   BPH associated with nocturia    History of asthma    childhood   History of CHF (congestive heart failure) 04/2004   in setting PE   History of pulmonary embolism 04/2004   with HF and hypertension;  bilateral lower lobe,  completed coumadin   (07-08-2021  pt stated never had clots before and none since, pt had negative work-up for blood disorder at that time)   Hyperlipidemia    Hypertension    followed by pcp   (per cardiology note in epic 0302-2023 nuclear stress test 07-27-2004 normal  and echo 07-27-2005 (ef 50-55%)   Malignant neoplasm prostate ([place]) 01/2021   urologist--- dr wrenn/  radiation oncology-- dr Tammi Klippel;    dx 09/ 2022  Gleason 3+4, PSA 10.5   Panic disorder    Paroxysmal SVT (supraventricular tachycardia) ([place])    nonsustained;   ED visit 12-26-2020  SVT heart rate 2013 x2 doses adenosine;  followed up with pcp was referred  to cardiology at the time but pt had no visit;  pt needed pcp clearance prior to surgery on 07-08-2021 and was referred to cardiology, office visit w/ cardiologist-- dr Stanton Kidney branch 07-07-2021, was cleared for surgery, pt to started taking toprol   Tachycardia    Type 2 diabetes mellitus ([place])    followed by pcp   (03-03-2023n pt stated checks blood sugar once daily , fasting (130s) and not fasting (165-170)   Wears partial dentures    upper   Past Surgical History:  Procedure Laterality Date   GOLD SEED IMPLANT N/A 07/12/2021   Procedure: GOLD SEED IMPLANT;  Surgeon: [name], MD;  Location: [facility];  Service: Urology;  Laterality: N/A;  30 MINS FOR CASE   NO PAST SURGERIES     SPACE OAR INSTILLATION N/A 07/12/2021   Procedure: SPACE OAR INSTILLATION;  Surgeon: [name], MD;  Location: [facility];  Service: Urology;  Laterality: N/A;   Family History  Problem Relation Age of Onset   Kidney disease Mother    Hypertension Mother    Hypertension Father    Heart attack Father    Social History   Socioeconomic History   Marital status: Married    Spouse name: Not on file   Number of children:  Not on file   Years of education: Not on file   Highest education level: Not on file  Occupational History   Not on file  Tobacco Use   Smoking status: Never   Smokeless tobacco: Never  Vaping Use   Vaping Use: Never used  Substance and Sexual Activity   Alcohol use: Yes    Comment: occasional   Drug use: Never   Sexual activity: Not on file  Other Topics Concern   Not on file  Social History Narrative   HH of 2 married    Musician   Working evening aren[employer]    [place]    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          Social Determinants of Health   Financial Resource Strain: Not on file  Food Insecurity: Not on file  Transportation Needs: Not on file  Physical Activity: Not on file  Stress: Not on file  Social Connections: Not on file    Allergies  Allergen Reactions   Crestor [Rosuvastatin Calcium]     Feels weak    Lipitor [Atorvastatin]     leg cramps   Zocor [Simvastatin]     leg cramps    Medications   Medications Prior to Admission  Medication Sig Dispense Refill Last Dose   aspirin 81 MG tablet Take 81 mg by mouth daily.   Past Week   dapagliflozin propanediol (FARXIGA) 10 MG TABS tablet Take 1 tablet (10 mg total) by mouth daily before breakfast. 21 tablet 0 Past Week   metoprolol succinate (TOPROL-XL) 25 MG 24 hr tablet Take 12.5 mg by mouth daily.   unknown     Vitals   Vitals:   08/01/22 1511 08/01/22 1700 08/01/22 1924 08/01/22 1942  BP:      Pulse:      Resp:      Temp:   97.7 F (36.5 C)   TempSrc:  Other (Comment) Axillary   SpO2: 100%   100%  Weight:      Height:         Body mass index is 29.12 kg/m.  Physical Exam   General: appears ill, Laying in bed intubated; in no acute distress.  HENT: Normal oropharynx and mucosa. Normal external appearance of ears and nose.  Neck: Supple, no pain or tenderness  CV: No JVD. No peripheral edema.  Pulmonary: Symmetric Chest rise. Brething over vent. Abdomen: Soft, non-tender Ext: No cyanosis, significant edema in all extremities. No deformity  Skin: No rash. Normal palpation of skin.   Musculoskeletal: Normal digits and nails by inspection. No clubbing.   Neurologic Examination  Mental status/Cognition: eyes closed, no spontaneous movements noted. No response to calling out his name. To loud clap, he partially opens both eyes and blinks a few times. He does not make eye contact or follow any commands. Speech/language: mute, no attempts to communicate. Cranial nerves:   CN II Pupils equal and reactive to light, unable to asses for VF deficits.   CN III,IV,VI Mild left gaze preference. Horizontal EOM intact to dolls eyes., no nystagmus.   CN V Corneals intact BL   CN VII No obvious facial asymmetry, but obscured by the ETT   CN VIII  Does not turn head towards speech or loud clap   CN IX & X Weak Cough but significantly hypertensive when attempting. Unable to elicit gag and aborted 2/2 uptrending BP and tachycardia.   CN XI Head is turned slightly to the left.   CN XII  Tongue moved to the side by ETT   Sensory/Motor:  Muscle bulk: normal, tone flaccid. No spontaneous movement noted. No movements to noxious stimuli in any of the extremities.  Coordination/Complex Motor:  - Unable to assess.  Labs   CBC:  Recent Labs  Lab 07/31/22 0414 08/01/22 0448  WBC 13.8* 14.7*  HGB 13.6 12.5*  HCT 40.3 36.7*  MCV 93.7 95.8  PLT 137* 121*    Basic Metabolic Panel:  Lab Results  Component Value Date   NA 137 08/01/2022   K 4.4 08/01/2022   CO2 25 08/01/2022   GLUCOSE 247 (H) 08/01/2022   BUN 30 (H) 08/01/2022   CREATININE 1.65 (H) 08/01/2022   CALCIUM 7.9 (L) 08/01/2022   GFRNONAA 46 (L) 08/01/2022   GFRAA 103 05/06/2008   Lipid Panel:  Lab Results  Component Value Date   LDLCALC 130 (H) 07/06/2020   HgbA1c:  Lab Results  Component Value Date   HGBA1C 7.8 (H) 07/30/2022   Urine Drug Screen:     Component Value Date/Time   LABOPIA NONE DETECTED 07/30/2022 0002   COCAINSCRNUR NONE DETECTED 07/30/2022 0002   LABBENZ NONE DETECTED 07/30/2022 0002   AMPHETMU NONE DETECTED 07/30/2022 0002   THCU NONE DETECTED 07/30/2022 0002   LABBARB NONE DETECTED 07/30/2022 0002    Alcohol Level     Component Value Date/Time   ETH <10 07/30/2022 0103    CT Head without contrast(Personally reviewed): 1. Cytotoxic edema in the right cerebellar hemisphere and left frontal lobe, likely early subacute infarcts. MRI would provide more detailed characterization. 2. Moderate mass effect on the fourth ventricle without hydrocephalus.  CT angio Head and Neck with contrast(Personally reviewed): pending  MRI Brain(Personally reviewed): 1. Acute infarcts in the right PICA territory and left cerebral cortex respecting  the left ICA distribution. 2. No hydrocephalus.  rEEG:  This study is suggestive of profound diffuse encephalopathy, nonspecific etiology but with history of cardiac arrest, could be secondary to anoxic/hypoxic brain injury. No seizures or epileptiform discharges were seen throughout the recording.   Impression   Bobby Obrien is a 62 y.o. male with PMH significant for p/w out of Hospital witnessed cardiac arrest with multiple rounds of CPR, multiple PEA arrests before ROSC with concern for prolonged low flow state. 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  Suspected etiology of these strokes is likely a combination of cardiac emboli in the setting of PEA arrest along with prolonged low flow state.  Overall, given the size of the stroke, specifically the R PICA stroke and its proximity to the brainstem and the 4th ventricle, he is at risk of developing hydrocephalus and mass effect on the brainstem and potential herniation of the brain.  I discussed with his significant other and sisters at bedside and reviewed images, deficit including likely R sided weakness, poor coordination, language deficit and likely requiring 24/7 care in a nursing home. They are certainly devastated and requesting conversation with broader family and the full team on Thursday.  Recommendations   R PICA and L ICA stroke, likely 2/2 embolic and low flow state with associated mass effect and risk of hydrocephalus: - Frequent Neuro checks per stroke unit protocol - Recommend brain imaging with MRI Brain without contrast - Recommend Vascular imaging with CTA head and neck - TTE with EF of 20-25%. - Lipid panel with LDL - Please start statin if LDL > 70 - HbA1c to evaluate for diabetes and how well it  is controlled. - Antithrombotic - Aspirin 81mg  daily. - Recommend DVT ppx - SBP goal - gradual normotension - Recommend Telemetry monitoring for arrythmia - Recommend bedside swallow screen  prior to PO intake. - Stroke education booklet - Recommend PT/OT/SLP consult - hypertonic saline 3% at 75cc/hr with goal sodium of 150-155. - palliative consult.  - Repeat CT Head w/o contrast in AM. - stroke team to follow.  This patient is critically ill and at significant risk of neurological worsening, death and care requires constant monitoring of vital signs, hemodynamics,respiratory and cardiac monitoring, neurological assessment, discussion with family, other specialists and medical decision making of high complexity. I spent 110 minutes of neurocritical care time  in the care of  this patient. This was time spent independent of any time provided by nurse practitioner or PA.  E[place] Pager Number [phone] 08/02/2022  12:42 AM   ______________________________________________________________________   Thank you for the opportunity to take part in the care of this patient. If you have any further questions, please contact the neurology consultation attending.  Signed,  Rossie Pager Number [phone] _ _ _   _ __   _ __ _ _  __ __   _ __   __ _

## 2022-08-01 NOTE — Progress Notes (Addendum)
NAME:  Bobby Obrien, MRN:  [id number], DOB:  [birth year], LOS: 3 ADMISSION DATE:  07/28/2022, CONSULTATION DATE:  3/23 REFERRING MD:  Dr. Darl Householder, CHIEF COMPLAINT:  cardiac arrest   History of Present Illness:  Patient is a 62 year old male with pertinent PMH previous DVT/PE in 2005 not currently on coumadin, CHF, HLD, prostate cancer, SVT, T2DM presents to [facility] ED on 3/23 cardiac arrest.  On 3/23 patient was at a dinner doing well. Was dancing and got tired drinking a lot of water. Then had witnessed arrest around 31.  EMS arrived and started CPR.  Had intermittent ROSC.  Longest ROSC about 10 minutes.  EMS reports patient's rhythm would go into V. tach that requiring multiple shocks.  Per EMS EKG showing possible STEMI.  Patient given amio, mag, fluids and transported to [facility] ED.  Upon arrival to [facility] ED patient without pulses PEA arrest and CPR resumed.  After ROSC patient with sats 50s to 60s.  Thought to have issues with ETT position and patient reintubated. Patient had another PEA arrest while in ED and ROSC achieved. Patient arrested 5 different times with estimated total ROSC at least 30 minutes. Patient was coughing/gagging against ETT and was started on some sedation; not following commands. Hypotensive started on epi and levo. Bicarb drip started. CXR showing extensive b/l infiltrate vs. Pulm edema. EKG no ST elevation; prolonged qtc. K 2.8, mag 3.4, glucose 410, creat 1.77, calcium 8.5, AG 19, co2 13, wbc 12.4, troponin 563, bnp 77, ast 411, alt 478. Cards consulted. PCCM consulted for icu admission.  Pertinent  Medical History   Past Medical History:  Diagnosis Date   BPH associated with nocturia    History of asthma    childhood   History of CHF (congestive heart failure) 04/2004   in setting PE   History of pulmonary embolism 04/2004   with HF and hypertension;  bilateral lower lobe,  completed coumadin   (07-08-2021  pt stated never had clots before and none since, pt had negative  work-up for blood disorder at that time)   Hyperlipidemia    Hypertension    followed by pcp   (per cardiology note in epic 0302-2023 nuclear stress test 07-27-2004 normal  and echo 07-27-2005 (ef 50-55%)   Malignant neoplasm prostate ([place]) 01/2021   urologist--- dr wrenn/  radiation oncology-- dr Tammi Klippel;    dx 09/ 2022  Gleason 3+4, PSA 10.5   Panic disorder    Paroxysmal SVT (supraventricular tachycardia) ([place])    nonsustained;   ED visit 12-26-2020  SVT heart rate 2013 x2 doses adenosine;  followed up with pcp was referred to cardiology at the time but pt had no visit;  pt needed pcp clearance prior to surgery on 07-08-2021 and was referred to cardiology, office visit w/ cardiologist-- dr Stanton Kidney branch 07-07-2021, was cleared for surgery, pt to started taking toprol   Tachycardia    Type 2 diabetes mellitus ([place])    followed by pcp   (03-03-2023n pt stated checks blood sugar once daily , fasting (130s) and not fasting (165-170)   Wears partial dentures    upper    Significant Hospital Events: Including procedures, antibiotic start and stop dates in addition to other pertinent events   3/23 vtach arrest; ROSC about 30 minutes?; pccm consulted   Interim History / Subjective:   Remains unresponsive Off pressors and inulin drip  Objective   Blood pressure (!) 147/96, pulse 90, temperature (!) 97.5 F (36.4 C), resp. rate  12, height 6' (1.829 m), weight 97.4 kg, SpO2 100 %. CVP:  [6 mmHg-13 mmHg] 13 mmHg  Vent Mode: PRVC FiO2 (%):  [40 %] 40 % Set Rate:  [24 bmp] 24 bmp Vt Set:  [460 mL] 460 mL PEEP:  [5 cmH20] 5 cmH20 Plateau Pressure:  [15 cmH20-17 cmH20] 17 cmH20   Intake/Output Summary (Last 24 hours) at 08/01/2022 1004 Last data filed at 08/01/2022 0900 Gross per 24 hour  Intake 1420.37 ml  Output 2250 ml  Net -829.63 ml   Filed Weights   07/30/22 0500 07/31/22 0701 08/01/22 0446  Weight: 87.8 kg 89.3 kg 97.4 kg    Examination: Blood pressure (!) 147/96, pulse 90,  temperature (!) 97.5 F (36.4 C), resp. rate 12, height 6' (1.829 m), weight 97.4 kg, SpO2 100 %. Gen:      No acute distress HEENT:  EOMI, sclera anicteric Neck:     No masses; no thyromegaly, ETT Lungs:    Clear to auscultation bilaterally; normal respiratory effort CV:         Regular rate and rhythm; no murmurs Abd:      + bowel sounds; soft, non-tender; no palpable masses, no distension Ext:    No edema; adequate peripheral perfusion Skin:      Warm and dry; no rash Neuro: Unresponsive, has cough reflex, minimal pupilary reflex, not breathing over the vent  Labs/imaging reviewed Significant for sodium 137, BUN/creatinine 19/1.50, AST 659, ALT 555 WBC 13.8, hemoglobin 13.6, platelets 137  Resolved Hospital Problem list    Anion gap metabolic acidosis with lactic acidosis.  Assessment & Plan:   Cardiogenic shock in setting of cardiac arrest. VT arrest in setting of hypokalemia. Hx of HFpEF, SVT. Echo reviewed with EF 0000000, grade 1 diastolic dysfunction.  Cardiology is on board May need cardiac ischemic evaluation if his neurostatus improves.  Acute hypoxic respiratory failure from aspiration pneumonitis and acute pulmonary edema. Continue vent support, low tidal volume ventilation Goal plateau pressure less than 30 Follow intermittent chest x-ray Continue Unasyn day 3/5 days Lasix 40 mg x 1  Acute metabolic encephalopathy 2nd to hypoxia, DKA. Concern for anoxic injury EEG negative for seizures Initial CT head normal. Check MRI brain Monitor neurostatus  AKI from ATN 2nd to shock, and DKA. Hypokalemia. - monitor urine outpt - f/u BMET, electrolyes  Elevated LFTs Maybe from shock liver. Monitor labs  DKA. Off insulin drip Start Semglee hold farxiga ?if this contributed to DKA  Constipation Add bowel regimen  HTN Start norvasc  Family updated at bedside  Best Practice (right click and "Reselect all SmartList Selections" daily)   Diet/type:  tubefeeds DVT prophylaxis: prophylactic heparin ; GI prophylaxis: H2B Lines: Central line and Arterial Line Foley:  Yes, and it is still needed Code Status:  full code Last date of multidisciplinary goals of care discussion [updated his wife at bedside]  Critical care time:    The patient is critically ill with multiple organ system failure and requires high complexity decision making for assessment and support, frequent evaluation and titration of therapies, advanced monitoring, review of radiographic studies and interpretation of complex data.   Critical Care Time devoted to patient care services, exclusive of separately billable procedures, described in this note is 35 minutes.   Marshell Garfinkel MD [facility] See Amion for pager  If no response to pager , please call [phone] until 7pm After 7:00 pm call Elink  [phone] 08/01/2022, 10:04 AM

## 2022-08-01 NOTE — Progress Notes (Signed)
Pt transported from 2M12 to CT and back with no complications.  

## 2022-08-01 NOTE — Progress Notes (Signed)
eLink Physician-Brief Progress Note Patient Name: Bobby Obrien DOB: November 10, 1960 MRN: [id number]   Date of Service  08/01/2022  HPI/Events of Note  Received notification that patient becomes hypertensive with any stimulation or turns with nursing care.  Pt is off propofol and fentanyl gtts.   At time of evaluation, BP 127/79, HR 91, RR 24, O2 sats 100%.   eICU Interventions  Give fentanyl IV PRN prior to turns and nursing care.      Intervention Category Intermediate Interventions: Other:  Elsie Lincoln 08/01/2022, 12:43 AM

## 2022-08-01 NOTE — Progress Notes (Signed)
eLink Physician-Brief Progress Note Patient Name: Bobby Obrien DOB: 1961-02-18 MRN: [id number]   Date of Service  08/01/2022  HPI/Events of Note  Patient with out of hospital cardiac arrest, coma and respiratory failure on the ventilator, MRI brain this afternoon shows embolic cerebellar strokes with mass effect but without hydrocephalus.  eICU Interventions  Interval brain CT scan ordered, Neurology consulted stat (I spoke with Dr. Milas Gain), he plans to start hypertonic saline gtt. Will defer to neurology regarding rest of CVA management.        [name] [name] 08/01/2022, 8:30 PM

## 2022-08-02 ENCOUNTER — Inpatient Hospital Stay (HOSPITAL_COMMUNITY): Payer: Medicaid Other

## 2022-08-02 DIAGNOSIS — I469 Cardiac arrest, cause unspecified: Secondary | ICD-10-CM

## 2022-08-02 DIAGNOSIS — Z515 Encounter for palliative care: Secondary | ICD-10-CM

## 2022-08-02 DIAGNOSIS — I639 Cerebral infarction, unspecified: Secondary | ICD-10-CM

## 2022-08-02 DIAGNOSIS — Z7189 Other specified counseling: Secondary | ICD-10-CM

## 2022-08-02 LAB — CBC
HCT: 35.6 % — ABNORMAL LOW (ref 39.0–52.0)
Hemoglobin: 11.5 g/dL — ABNORMAL LOW (ref 13.0–17.0)
MCH: 32.2 pg (ref 26.0–34.0)
MCHC: 32.3 g/dL (ref 30.0–36.0)
MCV: 99.7 fL (ref 80.0–100.0)
Platelets: 135 10*3/uL — ABNORMAL LOW (ref 150–400)
RBC: 3.57 MIL/uL — ABNORMAL LOW (ref 4.22–5.81)
RDW: 12.3 % (ref 11.5–15.5)
WBC: 16.2 10*3/uL — ABNORMAL HIGH (ref 4.0–10.5)
nRBC: 0 % (ref 0.0–0.2)

## 2022-08-02 LAB — COMPREHENSIVE METABOLIC PANEL
ALT: 138 U/L — ABNORMAL HIGH (ref 0–44)
AST: 103 U/L — ABNORMAL HIGH (ref 15–41)
Albumin: 2.4 g/dL — ABNORMAL LOW (ref 3.5–5.0)
Alkaline Phosphatase: 71 U/L (ref 38–126)
Anion gap: 9 (ref 5–15)
BUN: 38 mg/dL — ABNORMAL HIGH (ref 8–23)
CO2: 29 mmol/L (ref 22–32)
Calcium: 8.3 mg/dL — ABNORMAL LOW (ref 8.9–10.3)
Chloride: 109 mmol/L (ref 98–111)
Creatinine, Ser: 1.57 mg/dL — ABNORMAL HIGH (ref 0.61–1.24)
GFR, Estimated: 49 mL/min — ABNORMAL LOW (ref >60)
Glucose, Bld: 260 mg/dL — ABNORMAL HIGH (ref 70–99)
Potassium: 4.1 mmol/L (ref 3.5–5.1)
Sodium: 147 mmol/L — ABNORMAL HIGH (ref 135–145)
Total Bilirubin: 1.5 mg/dL — ABNORMAL HIGH (ref 0.3–1.2)
Total Protein: 5.8 g/dL — ABNORMAL LOW (ref 6.5–8.1)

## 2022-08-02 LAB — SODIUM
Sodium: 145 mmol/L (ref 135–145)
Sodium: 152 mmol/L — ABNORMAL HIGH (ref 135–145)
Sodium: 152 mmol/L — ABNORMAL HIGH (ref 135–145)

## 2022-08-02 LAB — GLUCOSE, CAPILLARY
Glucose-Capillary: 156 mg/dL — ABNORMAL HIGH (ref 70–99)
Glucose-Capillary: 162 mg/dL — ABNORMAL HIGH (ref 70–99)
Glucose-Capillary: 164 mg/dL — ABNORMAL HIGH (ref 70–99)
Glucose-Capillary: 184 mg/dL — ABNORMAL HIGH (ref 70–99)
Glucose-Capillary: 195 mg/dL — ABNORMAL HIGH (ref 70–99)
Glucose-Capillary: 224 mg/dL — ABNORMAL HIGH (ref 70–99)

## 2022-08-02 LAB — MAGNESIUM: Magnesium: 2.3 mg/dL (ref 1.7–2.4)

## 2022-08-02 MED ORDER — AMLODIPINE BESYLATE 10 MG PO TABS
10.0000 mg | ORAL_TABLET | Freq: Every day | ORAL | Status: DC
  Administered 2022-08-02 – 2022-08-06 (×5): 10 mg
  Filled 2022-08-02 (×5): qty 1

## 2022-08-02 MED ORDER — POLYETHYLENE GLYCOL 3350 17 G PO PACK
17.0000 g | PACK | Freq: Two times a day (BID) | ORAL | Status: DC
  Administered 2022-08-02 – 2022-08-05 (×7): 17 g
  Filled 2022-08-02 (×8): qty 1

## 2022-08-02 MED ORDER — SODIUM CHLORIDE 3 % IV SOLN
INTRAVENOUS | Status: DC
  Filled 2022-08-02 (×9): qty 500

## 2022-08-02 MED ORDER — INSULIN GLARGINE-YFGN 100 UNIT/ML ~~LOC~~ SOLN
35.0000 [IU] | Freq: Every day | SUBCUTANEOUS | Status: DC
  Administered 2022-08-02 – 2022-08-04 (×3): 35 [IU] via SUBCUTANEOUS
  Filled 2022-08-02 (×4): qty 0.35

## 2022-08-02 MED ORDER — FENTANYL 2500MCG IN NS 250ML (10MCG/ML) PREMIX INFUSION
50.0000 ug/h | INTRAVENOUS | Status: DC
  Administered 2022-08-02: 175 ug/h via INTRAVENOUS
  Administered 2022-08-02 – 2022-08-03 (×2): 150 ug/h via INTRAVENOUS
  Administered 2022-08-04: 100 ug/h via INTRAVENOUS
  Administered 2022-08-04: 50 ug/h via INTRAVENOUS
  Administered 2022-08-05 – 2022-08-06 (×3): 150 ug/h via INTRAVENOUS
  Filled 2022-08-02 (×7): qty 250

## 2022-08-02 NOTE — Progress Notes (Signed)
Carotid duplex and TCD studies have been completed.   Results can be found under chart review under CV PROC. 08/02/2022 4:59 PM Blondie Riggsbee RVT, RDMS

## 2022-08-02 NOTE — Consult Note (Signed)
Palliative Care Consult Note                                  Date: 08/02/2022   Patient Name: Bobby Obrien  DOB: [birth year]  MRN: [id number]  Age / Sex: 62 y.o., male  PCP: [name], MD Referring Physician: Marshell Garfinkel, MD  Reason for Consultation: Establishing goals of care  HPI/Patient Profile: 62 y.o. male  with past medical history of previous DVT/PE in 2005 not currently on coumadin, CHF, HLD, prostate cancer, SVT, T2DM presents to [facility] ED on 3/23 cardiac arrest.  He was admitted on 07/08/2022 with cardiogenic shock in the setting of cardiac arrest, VT arrest in the setting of hyperkalemia, history of HFpEF, acute hypoxic respiratory failure from aspiration pneumonitis and acute pulmonary edema, acute PTA, ICH stroke, concern for anoxic injury, AKI secondary to shock and DKA, and others.   PMT was consulted for [place] conversations.  Past Medical History:  Diagnosis Date   BPH associated with nocturia    History of asthma    childhood   History of CHF (congestive heart failure) 04/2004   in setting PE   History of pulmonary embolism 04/2004   with HF and hypertension;  bilateral lower lobe,  completed coumadin   (07-08-2021  pt stated never had clots before and none since, pt had negative work-up for blood disorder at that time)   Hyperlipidemia    Hypertension    followed by pcp   (per cardiology note in epic 0302-2023 nuclear stress test 07-27-2004 normal  and echo 07-27-2005 (ef 50-55%)   Malignant neoplasm prostate ([place]) 01/2021   urologist--- dr wrenn/  radiation oncology-- dr Tammi Klippel;    dx 09/ 2022  Gleason 3+4, PSA 10.5   Panic disorder    Paroxysmal SVT (supraventricular tachycardia) ([place])    nonsustained;   ED visit 12-26-2020  SVT heart rate 2013 x2 doses adenosine;  followed up with pcp was referred to cardiology at the time but pt had no visit;  pt needed pcp clearance prior to surgery on 07-08-2021 and was  referred to cardiology, office visit w/ cardiologist-- dr Stanton Kidney branch 07-07-2021, was cleared for surgery, pt to started taking toprol   Tachycardia    Type 2 diabetes mellitus ([place])    followed by pcp   (03-03-2023n pt stated checks blood sugar once daily , fasting (130s) and not fasting (165-170)   Wears partial dentures    upper    Subjective:   This NP Walden Field reviewed medical records, received report from team, assessed the patient and then meet at the patient's bedside to discuss diagnosis, prognosis, GOC, EOL wishes disposition and options.  I met with the patient at the bedside, although he is unable to meaningfully communicate due to intubation and level of acuity.  I spoke with the patient's wife Bobby Obrien and Bobby Obrien in the conference room.   Concept of Palliative Care was introduced as specialized medical care for people and their families living with serious illness.  If focuses on providing relief from the symptoms and stress of a serious illness.  The goal is to improve quality of life for both the patient and the family. Values and goals of care important to patient and family were attempted to be elicited.  Created space and opportunity for patient  and family to explore thoughts and feelings regarding current medical situation   Natural trajectory  and current clinical status were discussed. Questions and concerns addressed. Patient  encouraged to call with questions or concerns.    Patient/Family Understanding of Illness: They understand he had 2 strokes with the one on the right side having swelling pressing on the brainstem. They understand he had a cardiac arrest possibly from a diabetic event. The understand from the strokes he will likely be paralyzed on the right side, but unable to express himself or understand others. He will likely require prolonged/indefinite 24/7 nursing care.  We spent a substantial amount of time discussing other aspects of his acute  clinical presentation as well as chronic illnesses and how these all compliant to affect his prognosis.  Life Review: He is a Warehouse manager, had lots of close friends and cares for everyone. 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 He was described as "central to so many people."  Patient Values: Family, friends, independence  Goals: Full code, full scope for now as family processes the situation, further discussion on goals  Today's Discussion: In addition to discussions described above we had substantial discussion on various topics.  We further explored his current acute situations, prognosis.  We described his personal values and how these affect his goals.  They are clear, in general, that he would not want to be artificially prolonged for an extended amount of time.  He would not want to live if he could not sustain and participate in his life with his family and friends.  We discussed the difference between "being alive and living".  The patient's wife says that she only wants information shared with herself and the patient's 2 sisters Bobby Obrien indicated.  They discussed that they will debrief with other extended family and we will plan to have them come in for further discussion about these current situations.    We discussed CODE STATUS.  We discussed that if he had another cardiac arrest there is a small chance of successful resuscitation and that it would likely only worsen his current clinical situation.  They seemed understand and agree.  We also discussed the need to consider other future decisions that would need to be made including whether he would ever want prolonged artificial nutrition, tracheostomy with prolonged  ventilator, etc.  They express the need to have time to process the current situation before making significant decisions.  The patient's wife states that she will discuss with her family and call back with a date and time for family meeting with other members.  I shared that today is my last day for the week but I would sign off to another colleague to continue these conversations until I return on Monday.  I provided emotional and general support through therapeutic listening, empathy, sharing of stories, and other techniques. I answered all questions and addressed all concerns to the best of my ability.  Review of Systems  Unable to perform ROS: Intubated    Objective:   Primary Diagnoses: Present on Admission:  Cardiac arrest [facility])   Physical Exam Vitals reviewed.  Constitutional:      General: He is not in acute distress.    Appearance: He is ill-appearing.     Interventions: He is sedated and intubated.  HENT:  Head: Normocephalic and atraumatic.  Cardiovascular:     Rate and Rhythm: Normal rate.  Pulmonary:     Effort: He is intubated.  Abdominal:     General: Abdomen is flat. There is no distension.  Skin:    General: Skin is warm and dry.     Vital Signs:  BP 133/76 (BP Location: Left Leg)   Pulse 88   Temp 99.3 F (37.4 C)   Resp (!) 24   Ht 6' (1.829 m)   Wt 94.4 kg   SpO2 96%   BMI 28.23 kg/m   Palliative Assessment/Data: 10%    Advanced Care Planning:   Existing Vynca/ACP Documentation: None  Primary Decision Maker: NEXT OF KIN  Code Status/Advance Care Planning: Full code  A discussion was had today regarding advanced directives. Concepts specific to code status, artifical feeding and hydration, continued IV antibiotics and rehospitalization was had.  The difference between a aggressive medical intervention path and a palliative comfort care path for this patient at this time was had.   Decisions/Changes to ACP: None  today  Assessment & Plan:   SUMMARY OF RECOMMENDATIONS   Full code for now Full scope of care Allow time for family to process Anticipate follow-up family meeting with more family members in the coming days for further [place] conversations Continued emotional support of patient and family PMT will continue to follow  Symptom Management:  Primary team PMT is available to assist as needed  Prognosis:  Unable to determine  Discharge Planning:  To Be Determined   Discussed with: Patient's family, medical team, nursing team    Thank you for allowing [place] to participate in the care of Bobby Obrien PMT will continue to support holistically.  Time Total: 95 min  Greater than 50%  of this time was spent counseling and coordinating care related to the above assessment and plan.  Signed by: Walden Field, NP Palliative Medicine Team  Team Phone # [phone] (Nights/Weekends)  08/02/2022, 10:07 PM

## 2022-08-02 NOTE — Progress Notes (Signed)
eLink Physician-Brief Progress Note Patient Name: Bobby Obrien DOB: 1961/04/08 MRN: [id number]   Date of Service  08/02/2022  HPI/Events of Note  Patient with ventilator dyssynchrony and  blood pressure spike, neurology feels it may be due to head pain and is okay with escalating analgo-sedation.  eICU Interventions  Fentanyl gtt started with a good response, ceiling currently at 300 mcg, if this ceiling is exceeded will consider adding low dose Propofol.        [name] Avenell Sellers 08/02/2022, 3:37 AM

## 2022-08-02 NOTE — Progress Notes (Signed)
Patient's sodium level increased from 147 to 152. Ordered Sodium level range 150-155.  Notified MD Leonie Man and MD Mannam of Sodium result due to amount of increase from previous sodium level. MD Leonie Man advised to hold 3% sodium chloride infusion until next scheduled sodium level result and advisement at that time from MD on how to proceed.

## 2022-08-02 NOTE — Progress Notes (Signed)
After pt mouth care RN noted that the pt 's blood pressure increased to 270/110 Hr was 130's and pt became non compliant with the vent. RN gave fentayl bolus per orders and IV labetalol Per orders.  Dr.Ogan and Dr. Lorrin Goodell notified. Orders placed. See MAR. Pt sedation increased to obtain Vent compliance . See MAR.

## 2022-08-02 NOTE — Progress Notes (Addendum)
STROKE TEAM PROGRESS NOTE   SUBJECTIVE (INTERVAL HISTORY) No family at bedside Patient's remains intubated and unresponsive Prognosis is poor Pending goals of care conversations with family MRI scan yesterday showed acute infarct in the right PICA and left frontal MCA branch distribution. EEG showed suppressed background and diffuse encephalopathy. OBJECTIVE Vitals:   08/02/22 1115 08/02/22 1130 08/02/22 1145 08/02/22 1152  BP:      Pulse: 92 92 91   Resp: 20 (!) 22 (!) 21   Temp: 99.3 F (37.4 C) 99.5 F (37.5 C) 99.5 F (37.5 C)   TempSrc:      SpO2: 91% 91% 99% 95%  Weight:      Height:        CBC:  Recent Labs  Lab 08/01/22 0448 08/02/22 0500  WBC 14.7* 16.2*  HGB 12.5* 11.5*  HCT 36.7* 35.6*  MCV 95.8 99.7  PLT 121* 135*    Basic Metabolic Panel:  Recent Labs  Lab 07/30/22 1234 07/31/22 0332 07/31/22 0356 08/01/22 0448 08/02/22 0041 08/02/22 0500 08/02/22 1134  NA 137 137   < > 137   < > 147* 152*  K 3.9 3.4*   < > 4.4  --  4.1  --   CL 100 107  --  102  --  109  --   CO2 24 23  --  25  --  29  --   GLUCOSE 267* 192*  --  247*  --  260*  --   BUN 23 19  --  30*  --  38*  --   CREATININE 2.06* 1.50*  --  1.65*  --  1.57*  --   CALCIUM 7.8* 6.5*  --  7.9*  --  8.3*  --   MG 2.0 1.6*  --  2.4  --  2.3  --   PHOS 2.3* 3.2  --   --   --   --   --    < > = values in this interval not displayed.    Lipid Panel:  Recent Labs  Lab 07/30/22 0110  TRIG 187*   HgbA1c:  Lab Results  Component Value Date   HGBA1C 7.8 (H) 07/30/2022   Urine Drug Screen:     Component Value Date/Time   LABOPIA NONE DETECTED 07/30/2022 0002   COCAINSCRNUR NONE DETECTED 07/30/2022 0002   LABBENZ NONE DETECTED 07/30/2022 0002   AMPHETMU NONE DETECTED 07/30/2022 0002   THCU NONE DETECTED 07/30/2022 0002   LABBARB NONE DETECTED 07/30/2022 0002    Alcohol Level     Component Value Date/Time   ETH <10 07/30/2022 0103    IMAGING  Results for orders placed or  performed during the hospital encounter of 07/30/2022  MR BRAIN W WO CONTRAST   Narrative   CLINICAL DATA:  Anoxic brain injury  EXAM: MRI HEAD WITHOUT AND WITH CONTRAST  TECHNIQUE: Multiplanar, multiecho pulse sequences of the brain and surrounding structures were obtained without and with intravenous contrast.  CONTRAST:  52mL GADAVIST GADOBUTROL 1 MMOL/ML IV SOLN  COMPARISON:  Head CT from 3 days ago  FINDINGS: Brain: Diffuse restricted diffusion throughout the left cerebral hemisphere with some sparing at the occipital lobe. Although seizure phenomenon is considered given the diffuse appearance, there is a vascular pattern with sparing of the posterior circulation. No bilateral cortical involvement for generalized seizure or global anoxic injury.  Dense restricted diffusion inferior right cerebellum with possible mild petechial hemorrhage by precontrast T1 weighted imaging. There is fourth ventricular  mass effect but no hydrocephalus.  Vascular: Major flow voids and vascular enhancements are preserved.  Skull and upper cervical spine: Normal marrow signal  Sinuses/Orbits: Numerous retention cysts in the bilateral maxillary sinus. Negative orbits.  Insert PRA call rib  IMPRESSION: 1. Acute infarcts in the right PICA territory and left cerebral cortex respecting the left ICA distribution. 2. No hydrocephalus.   Electronically Signed   By: Jorje Guild M.D.   On: 08/01/2022 18:58   CT HEAD WO CONTRAST (5MM)   Narrative   CLINICAL DATA:  Acute neurologic deficit  EXAM: CT HEAD WITHOUT CONTRAST  TECHNIQUE: Contiguous axial images were obtained from the base of the skull through the vertex without intravenous contrast.  RADIATION DOSE REDUCTION: This exam was performed according to the departmental dose-optimization program which includes automated exposure control, adjustment of the mA and/or kV according to patient size and/or use of iterative  reconstruction technique.  COMPARISON:  None Available.  FINDINGS: Brain: Cytotoxic edema in the right cerebellar hemisphere and left frontal lobe, likely early subacute infarcts. There is moderate mass effect on the fourth ventricle. No hydrocephalus. No acute hemorrhage.  Vascular: No abnormal hyperdensity of the major intracranial arteries or dural venous sinuses. No intracranial atherosclerosis.  Skull: The visualized skull base, calvarium and extracranial soft tissues are normal.  Sinuses/Orbits: Near complete opacification of the maxillary sinuses. The orbits are normal.  IMPRESSION: 1. Cytotoxic edema in the right cerebellar hemisphere and left frontal lobe, likely early subacute infarcts. MRI would provide more detailed characterization. 2. Moderate mass effect on the fourth ventricle without hydrocephalus.  These results will be called to the ordering clinician or representative by the Radiologist Assistant, and communication documented in the PACS or [employer].   Electronically Signed   By: Ulyses Jarred M.D.   On: 08/01/2022 20:59      PHYSICAL EXAM General: Acutely ill male on mechanical ventilation. HEENT: [place]/AT.  EOMI.  Anicteric sclerae.  ET tube stable in place Respiratory: Normal respiratory effort.  Mechanical lung sounds. Cardiovascular: Regular rate and rhythm.  Neuro: Intubated and sedated.  Eyes close with no purposeful movement.  Flaccid muscle tone.  Does not follow any commands.  No response to motor stimuli.  Doll's eye movements are sluggish.  Pupils are sluggishly reactive.  Corneal reflexes are sluggish.  ASSESSMENT/PLAN Bobby Obrien is a 62 y.o. male with history of previous DVT/PE in 2005 not currently on coumadin, CHF, HLD, prostate cancer, SVT, T2DM who presented with out of Hospital witnessed cardiac arrest with multiple rounds of CPR, multiple PEA arrests before ROSC with concern for prolonged low flow state. MRI brain  shows large R PICA stroke along with a L ICA stroke. Due to prolonged cardiac arrest leading to anoxic brain injury, patient likely unable to have any meaningful neurological recovery. Recommend palliative care consult to have goals of care conversation.  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 Moderate mass effect on the fourth ventricle without hydrocephalus. MRI brain acute infarcts in the right PICA territory and left cerebral cortex respecting the left ICA distribution. No hydrocephalus. Transcranial Doppler pending Carotid Doppler pending 2D Echo EF 20-25%, G1DD, mild MR LDL pending HgbA1c 7.8% Subcu heparin for VTE prophylaxis Diet Order             Diet NPO  time specified  Diet effective now                  Therapy recommendations: Pending Disposition: Pending Sodium 152, hold hypertonic saline Acute metabolic encephalopathy Secondary to low flow state in the setting of prolonged cardiac arrest Remains sedated and intubated Meaningful neurological recovery unlikely. Recommend palliative consult  Hypertension Stable  Permissive hypertension (OK if < 220/120) but gradually normalize in 5-7 days  Long-term BP goal normotensive  Hyperlipidemia LDL pending, goal < 70  Diabetes type II HgbA1c 7.8%, goal < 7.0 SSI with CBG monitoring  Other Active Problems Cardiogenic shock in the setting of cardiac arrest Acute hypoxic respiratory failure Diabetic ketoacidosis Acute kidney injury Per PCCM Heart failure with reduced ejection fraction Per cardiology  Hospital day # 4   To contact Stroke Continuity provider, please refer to [email or web address] After hours, contact General Neurology  Lacinda Axon, MD 08/02/2022, 4:33 PM IM Resident, PGY-3 Oswaldo Milian 41:10  I have  personally obtained history,examined this patient, reviewed notes, independently viewed imaging studies, participated in medical decision making and plan of care.ROS completed by me personally and pertinent positives fully documented  I have made any additions or clarifications directly to the above note. Agree with note above.  Patient had a PEA cardiac arrest following which neurological exam is remains poor at 72 hours postarrest consistent with hypoxic ischemic encephalopathy.  Brain imaging shows right PICA and left frontal MCA branch infarcts which are likely secondary to hypotension during the cardiac arrest.  Patient prognosis is quite poor and chances of survival making meaningful recovery are very minimal.  Recommend goals of care discussion with patient's family and palliative care consult.  Discussed with Dr. Kimber Relic critical care medicine.This patient is critically ill and at significant risk of neurological worsening, death and care requires constant monitoring of vital signs, hemodynamics,respiratory and cardiac monitoring, extensive review of multiple databases, frequent neurological assessment, discussion with family, other specialists and medical decision making of high complexity.I have made any additions or clarifications directly to the above note.This critical care time does not reflect procedure time, or teaching time or supervisory time of PA/NP/Med Resident etc but could involve care discussion time.  I spent 30 minutes of neurocritical care time  in the care of  this patient.      Antony Contras, MD Medical Director [facility] Pager: [phone] 08/02/2022 5:10 PM

## 2022-08-02 NOTE — Progress Notes (Signed)
NAME:  Bobby Obrien, MRN:  [id number], DOB:  05-07-1961, LOS: 4 ADMISSION DATE:  08/04/2022, CONSULTATION DATE:  3/23 REFERRING MD:  Dr. Darl Householder, CHIEF COMPLAINT:  cardiac arrest   History of Present Illness:  Patient is a 62 year old male with pertinent PMH previous DVT/PE in 2005 not currently on coumadin, CHF, HLD, prostate cancer, SVT, T2DM presents to [facility] ED on 3/23 cardiac arrest.  On 3/23 patient was at a dinner doing well. Was dancing and got tired drinking a lot of water. Then had witnessed arrest around 6.  EMS arrived and started CPR.  Had intermittent ROSC.  Longest ROSC about 10 minutes.  EMS reports patient's rhythm would go into V. tach that requiring multiple shocks.  Per EMS EKG showing possible STEMI.  Patient given amio, mag, fluids and transported to [facility] ED.  Upon arrival to [facility] ED patient without pulses PEA arrest and CPR resumed.  After ROSC patient with sats 50s to 60s.  Thought to have issues with ETT position and patient reintubated. Patient had another PEA arrest while in ED and ROSC achieved. Patient arrested 5 different times with estimated total ROSC at least 30 minutes. Patient was coughing/gagging against ETT and was started on some sedation; not following commands. Hypotensive started on epi and levo. Bicarb drip started. CXR showing extensive b/l infiltrate vs. Pulm edema. EKG no ST elevation; prolonged qtc. K 2.8, mag 3.4, glucose 410, creat 1.77, calcium 8.5, AG 19, co2 13, wbc 12.4, troponin 563, bnp 77, ast 411, alt 478. Cards consulted. PCCM consulted for icu admission.  Pertinent  Medical History   Past Medical History:  Diagnosis Date   BPH associated with nocturia    History of asthma    childhood   History of CHF (congestive heart failure) 04/2004   in setting PE   History of pulmonary embolism 04/2004   with HF and hypertension;  bilateral lower lobe,  completed coumadin   (07-08-2021  pt stated never had clots before and none since, pt had negative  work-up for blood disorder at that time)   Hyperlipidemia    Hypertension    followed by pcp   (per cardiology note in epic 0302-2023 nuclear stress test 07-27-2004 normal  and echo 07-27-2005 (ef 50-55%)   Malignant neoplasm prostate ([place]) 01/2021   urologist--- dr wrenn/  radiation oncology-- dr Tammi Klippel;    dx 09/ 2022  Gleason 3+4, PSA 10.5   Panic disorder    Paroxysmal SVT (supraventricular tachycardia) ([place])    nonsustained;   ED visit 12-26-2020  SVT heart rate 2013 x2 doses adenosine;  followed up with pcp was referred to cardiology at the time but pt had no visit;  pt needed pcp clearance prior to surgery on 07-08-2021 and was referred to cardiology, office visit w/ cardiologist-- dr Stanton Kidney branch 07-07-2021, was cleared for surgery, pt to started taking toprol   Tachycardia    Type 2 diabetes mellitus ([place])    followed by pcp   (03-03-2023n pt stated checks blood sugar once daily , fasting (130s) and not fasting (165-170)   Wears partial dentures    upper    Significant Hospital Events: Including procedures, antibiotic start and stop dates in addition to other pertinent events   3/23 vtach arrest; ROSC about 30 minutes?; pccm consulted  3/26 Remains poorly responsive. MRI shows large R PICA stroke along with a L ICA stroke. Neuro consulted. Started on hypertonic saline  Interim History / Subjective:   Remains poorly responsive.  MRI shows large R PICA stroke along with a L ICA stroke. Neuro consulted on 3/26  Objective   Blood pressure (!) 156/67, pulse 95, temperature 100.2 F (37.9 C), resp. rate 20, height 6' (1.829 m), weight 97.4 kg, SpO2 96 %. CVP:  [4 mmHg-9 mmHg] 6 mmHg  Vent Mode: PRVC FiO2 (%):  [40 %-50 %] 50 % Set Rate:  [24 bmp] 24 bmp Vt Set:  [460 mL] 460 mL PEEP:  [5 cmH20] 5 cmH20 Plateau Pressure:  [12 [id number] cmH20] 12 cmH20   Intake/Output Summary (Last 24 hours) at 08/02/2022 0921 Last data filed at 08/02/2022 0913 Gross per 24 hour  Intake 2105.7 ml   Output 5615 ml  Net -3509.3 ml   Filed Weights   07/30/22 0500 07/31/22 0701 08/01/22 0446  Weight: 87.8 kg 89.3 kg 97.4 kg    Examination: Gen:      No acute distress HEENT:  EOMI, sclera anicteric Neck:     No masses; no thyromegaly, ETT Lungs:    Clear to auscultation bilaterally; normal respiratory effort CV:         Regular rate and rhythm; no murmurs Abd:      + bowel sounds; soft, non-tender; no palpable masses, no distension Ext:    No edema; adequate peripheral perfusion Skin:      Warm and dry; no rash Neuro: Unresponsive on vent, pupils reactive, has gag refelx  Labs/imaging reviewed Sodium 147, BUN/creatinine 38/1.57 AST 103, ALT 138, total bilirubin 11.5 WBC 16.2, hemoglobin 11.5, platelets 135  Resolved Hospital Problem list    Anion gap metabolic acidosis with lactic acidosis. Foot anything for the Assessment & Plan:  Cardiogenic shock in setting of cardiac arrest. VT arrest in setting of hypokalemia. Hx of HFpEF, SVT. Echo reviewed with EF 0000000, grade 1 diastolic dysfunction.  Cardiology is on board May need cardiac ischemic evaluation if his neurostatus improves.  Acute hypoxic respiratory failure from aspiration pneumonitis and acute pulmonary edema. Continue vent support, low tidal volume ventilation Goal plateau pressure less than 30 Follow intermittent chest x-ray Continue Unasyn day 4/5 days  Acute PCA, ICA [place] Acute metabolic encephalopathy 2nd to hypoxia, DKA. Concern for anoxic injury Neuro consulted [place] Na 150-155 on 3% saline Repeat head CT per neurology Monitor neurostatus  AKI from ATN 2nd to shock, and DKA. Hypokalemia. Monitor urine outpt f/u BMET, electrolyes  Elevated LFTs Maybe from shock liver. Monitor labs  DKA. Off insulin drip Increase semglee to 35 units, Continue SSI, tube feed coverage  Constipation Bowel regimen. Increase miralax to bid  HTN Increase Norvasc to 10,  Continue hydralazine PRN  Goals of  care Prognosis for meaningful recovery is poor. Will consult palliative care  Best Practice (right click and "Reselect all SmartList Selections" daily)   Diet/type: tubefeeds DVT prophylaxis: prophylactic heparin ; GI prophylaxis: H2B Lines: Central line and Arterial Line Foley:  Yes, and it is still needed Code Status:  full code Last date of multidisciplinary goals of care discussion [updated his wife at bedside]  Critical care time:    The patient is critically ill with multiple organ system failure and requires high complexity decision making for assessment and support, frequent evaluation and titration of therapies, advanced monitoring, review of radiographic studies and interpretation of complex data.   Critical Care Time devoted to patient care services, exclusive of separately billable procedures, described in this note is 35 minutes.   Marshell Garfinkel MD [facility] See Amion for pager  If no response to pager , please call [phone] until 7pm After 7:00 pm call Elink  [phone] 08/02/2022, 9:21 AM

## 2022-08-03 ENCOUNTER — Inpatient Hospital Stay (HOSPITAL_COMMUNITY): Payer: Medicaid Other

## 2022-08-03 LAB — GLUCOSE, CAPILLARY
Glucose-Capillary: 175 mg/dL — ABNORMAL HIGH (ref 70–99)
Glucose-Capillary: 169 mg/dL — ABNORMAL HIGH (ref 70–99)
Glucose-Capillary: 210 mg/dL — ABNORMAL HIGH (ref 70–99)
Glucose-Capillary: 230 mg/dL — ABNORMAL HIGH (ref 70–99)
Glucose-Capillary: 235 mg/dL — ABNORMAL HIGH (ref 70–99)
Glucose-Capillary: 194 mg/dL — ABNORMAL HIGH (ref 70–99)

## 2022-08-03 LAB — CBC
HCT: 36.6 % — ABNORMAL LOW (ref 39.0–52.0)
Hemoglobin: 11.1 g/dL — ABNORMAL LOW (ref 13.0–17.0)
MCH: 31.9 pg (ref 26.0–34.0)
MCHC: 30.3 g/dL (ref 30.0–36.0)
MCV: 105.2 fL — ABNORMAL HIGH (ref 80.0–100.0)
Platelets: 155 10*3/uL (ref 150–400)
RBC: 3.48 MIL/uL — ABNORMAL LOW (ref 4.22–5.81)
RDW: 12.6 % (ref 11.5–15.5)
WBC: 13.8 10*3/uL — ABNORMAL HIGH (ref 4.0–10.5)
nRBC: 0 % (ref 0.0–0.2)

## 2022-08-03 LAB — COMPREHENSIVE METABOLIC PANEL
ALT: 100 U/L — ABNORMAL HIGH (ref 0–44)
AST: 85 U/L — ABNORMAL HIGH (ref 15–41)
Albumin: 2.3 g/dL — ABNORMAL LOW (ref 3.5–5.0)
Alkaline Phosphatase: 74 U/L (ref 38–126)
Anion gap: 7 (ref 5–15)
BUN: 34 mg/dL — ABNORMAL HIGH (ref 8–23)
CO2: 28 mmol/L (ref 22–32)
Calcium: 8.3 mg/dL — ABNORMAL LOW (ref 8.9–10.3)
Chloride: 119 mmol/L — ABNORMAL HIGH (ref 98–111)
Creatinine, Ser: 1.29 mg/dL — ABNORMAL HIGH (ref 0.61–1.24)
GFR, Estimated: >60 mL/min (ref >60)
Glucose, Bld: 204 mg/dL — ABNORMAL HIGH (ref 70–99)
Potassium: 4.1 mmol/L (ref 3.5–5.1)
Sodium: 154 mmol/L — ABNORMAL HIGH (ref 135–145)
Total Bilirubin: 1.1 mg/dL (ref 0.3–1.2)
Total Protein: 5.8 g/dL — ABNORMAL LOW (ref 6.5–8.1)

## 2022-08-03 LAB — SODIUM
Sodium: 154 mmol/L — ABNORMAL HIGH (ref 135–145)
Sodium: 155 mmol/L — ABNORMAL HIGH (ref 135–145)
Sodium: 156 mmol/L — ABNORMAL HIGH (ref 135–145)
Sodium: 161 mmol/L (ref 135–145)

## 2022-08-03 LAB — MAGNESIUM: Magnesium: 2.4 mg/dL (ref 1.7–2.4)

## 2022-08-03 LAB — URINE CULTURE: Culture: NO GROWTH

## 2022-08-03 MED ORDER — ACETAMINOPHEN 500 MG PO TABS
1000.0000 mg | ORAL_TABLET | Freq: Four times a day (QID) | ORAL | Status: DC | PRN
  Administered 2022-08-03 – 2022-08-05 (×7): 1000 mg
  Filled 2022-08-03 (×7): qty 2

## 2022-08-03 MED ORDER — SORBITOL 70 % SOLN
30.0000 mL | Freq: Once | Status: AC
  Administered 2022-08-03: 30 mL
  Filled 2022-08-03: qty 30

## 2022-08-03 MED ORDER — MIDAZOLAM HCL 2 MG/2ML IJ SOLN
2.0000 mg | INTRAMUSCULAR | Status: DC | PRN
  Filled 2022-08-03: qty 2

## 2022-08-03 MED ORDER — IBUPROFEN 200 MG PO TABS
400.0000 mg | ORAL_TABLET | Freq: Four times a day (QID) | ORAL | Status: DC | PRN
  Administered 2022-08-03 – 2022-08-05 (×7): 400 mg
  Filled 2022-08-03 (×8): qty 2

## 2022-08-03 MED ORDER — ACETAMINOPHEN 325 MG PO TABS
650.0000 mg | ORAL_TABLET | Freq: Four times a day (QID) | ORAL | Status: DC | PRN
  Administered 2022-08-03: 650 mg
  Filled 2022-08-03: qty 2

## 2022-08-03 MED ORDER — FREE WATER
100.0000 mL | Status: DC
  Administered 2022-08-03 – 2022-08-04 (×4): 100 mL

## 2022-08-03 MED ORDER — FAMOTIDINE 20 MG PO TABS
20.0000 mg | ORAL_TABLET | Freq: Two times a day (BID) | ORAL | Status: DC
  Administered 2022-08-03: 20 mg
  Filled 2022-08-03: qty 1

## 2022-08-03 MED ORDER — SODIUM CHLORIDE 0.45 % IV SOLN: INTRAVENOUS | Status: DC

## 2022-08-03 NOTE — Progress Notes (Signed)
Brief Neuro note:  Informed of Na of 161, will start 0.45% NS. With his hx of DM2 and elevated glucose, D5 may not be a good choice. Repeat sodium in 6 hours.  [place] Pager Number [phone]

## 2022-08-03 NOTE — Progress Notes (Signed)
Daily Progress Note   Patient Name: Bobby Obrien       Date: 08/03/2022 DOB: [birth year]  Age: 62 y.o. MRN#: [id number] Attending Physician: Bobby Garfinkel, MD Primary Care Physician: Bobby Medin, MD Admit Date: 08/01/2022  Reason for Consultation/Follow-up: Establishing goals of care  Subjective: Remains intubated, family at bedside  Length of Stay: 5  Current Medications: Scheduled Meds:   amLODipine  10 mg Per Tube Daily   Chlorhexidine Gluconate Cloth  6 each Topical Q0600   docusate  100 mg Per Tube BID   famotidine  20 mg Per Tube BID   feeding supplement (PROSource TF20)  60 mL Per Tube Daily   heparin injection (subcutaneous)  5,000 Units Subcutaneous Q8H   insulin aspart  0-15 Units Subcutaneous Q4H   insulin aspart  4 Units Subcutaneous Q4H   insulin glargine-yfgn  35 Units Subcutaneous Daily   mouth rinse  15 mL Mouth Rinse Q2H   polyethylene glycol  17 g Per Tube BID   sennosides  10 mL Per Tube BID   sodium chloride flush  10-40 mL Intracatheter Q12H    Continuous Infusions:  sodium chloride 10 mL/hr at 08/03/22 1206   ampicillin-sulbactam (UNASYN) IV Stopped (08/03/22 1005)   feeding supplement (VITAL 1.5 CAL) 55 mL/hr at 08/03/22 1206   fentaNYL infusion INTRAVENOUS 125 mcg/hr (08/03/22 1206)   sodium chloride (hypertonic) Stopped (08/02/22 1241)    PRN Meds: acetaminophen, dextrose, docusate, fentaNYL, fentaNYL (SUBLIMAZE) injection, labetalol, mouth rinse, polyethylene glycol, sodium chloride flush  Physical Exam Constitutional:      General: He is not in acute distress.    Appearance: He is ill-appearing.     Comments: Remains on ventilator  Skin:    General: Skin is warm and dry.             Vital Signs: BP (!) 169/74   Pulse (!) 112   Temp (!) 100.6 F  (38.1 C)   Resp (!) 22   Ht 6' (1.829 m)   Wt 91.4 kg   SpO2 92%   BMI 27.33 kg/m  SpO2: SpO2: 92 % O2 Device: O2 Device: Ventilator O2 Flow Rate: O2 Flow Rate (L/min): 40 L/min  Intake/output summary:  Intake/Output Summary (Last 24 hours) at 08/03/2022 1437 Last data filed at 08/03/2022 1206 Gross per 24 hour  Intake 2001.75 ml  Output 3100 ml  Net -1098.25 ml   LBM: Last BM Date :  (PTA) Baseline Weight: Weight: 87.8 kg Most recent weight: Weight: 91.4 kg  Patient Active Problem List   Diagnosis Date Noted   Cardiac arrest ([place]) 07/14/2022   Hypokalemia 07/07/2022   Acute respiratory failure with hypoxia ([place]) 07/11/2022   Malignant neoplasm of prostate ([place]) 04/05/2021   Diabetes mellitus without complication ([place]) [id number]   Visit for preventive health examination 11/22/2012   Statin intolerance 11/20/2012   Does not have health insurance 09/03/2011   Preventive measure 06/21/2010   POSTNASAL DRIP SYNDROME 01/21/2007   DIABETES MELLITUS, TYPE II 12/04/2006   HYPERLIPIDEMIA 12/04/2006   HYPERTENSION 12/04/2006   DISEASE, HYPERTENSIVE HEART, BENIGN, W/HF 12/04/2006   Personal history of venous thrombosis and embolism 12/04/2006    Palliative Care Assessment &  Plan   HPI: 62 y.o. male  with past medical history of previous DVT/PE in 2005 not currently on coumadin, CHF, HLD, prostate cancer, SVT, T2DM presents to [facility] ED on 3/23 cardiac arrest.  He was admitted on 07/23/2022 with cardiogenic shock in the setting of cardiac arrest, VT arrest in the setting of hyperkalemia, history of HFpEF, acute hypoxic respiratory failure from aspiration pneumonitis and acute pulmonary edema, acute PTA, ICH stroke, concern for anoxic injury, AKI secondary to shock and DKA, and others.    PMT was consulted for [place] conversations.  Assessment: Call to wife this AM. We reviewed conversation with Bobby Obrien yesterday, all questions and concerns addressed. She shares she would like another  family meeting to include more family members to share information with all family together. We made a plan for Friday at 1:30 PM. She requests that neurology be there.   Later met at bedside with family there - discussed plan for meeting tomorrow. They have no current questions or concerns.  RN provided update of non purposeful movement on L side an eye opening with L upper gaze.   Wife called back to let us know she would like it discussed tomorrow how we can keep Bobby Obrien comfortable.   Recommendations/Plan: Planning for family meeting 1:30 3/29 - neurology aware of family's request for them to be present  Care plan was discussed with Dr. Leonie Obrien, Dr. Vaughan Browner, RN, patient's wife, patient's sister  Thank you for allowing the Palliative Medicine Team to assist in the care of this patient.  *Please note that this is a verbal dictation therefore any spelling or grammatical errors are due to the "[employer] [employer]" system interpretation.  Bobby Burrow, DNP, [facility] Palliative Medicine Team Team Phone # [phone]  Pager [phone]

## 2022-08-03 NOTE — Progress Notes (Addendum)
STROKE TEAM PROGRESS NOTE   SUBJECTIVE (INTERVAL HISTORY) Evaluated with sister at bedside Patient unresponsive with decreased sedation Eyes open spontaneously but with no awareness or response to painful stimuli Discussed plan for repeat EEG today and family meeting tomorrow afternoon to discuss goals of care Sodium remains at goal, continue to hold hypertonic saline  OBJECTIVE Vitals:   08/03/22 1140 08/03/22 1200 08/03/22 1300 08/03/22 1400  BP: (!) 169/74     Pulse: (!) 104 (!) 105 (!) 108 (!) 112  Resp: (!) 25 18 (!) 22 (!) 22  Temp: (!) 101.1 F (38.4 C) (!) 101.1 F (38.4 C) (!) 100.6 F (38.1 C) (!) 100.6 F (38.1 C)  TempSrc:      SpO2: 92% 92% 92% 92%  Weight:      Height:        CBC:  Recent Labs  Lab 08/02/22 0500 08/03/22 0421  WBC 16.2* 13.8*  HGB 11.5* 11.1*  HCT 35.6* 36.6*  MCV 99.7 105.2*  PLT 135* [id number]    Basic Metabolic Panel:  Recent Labs  Lab 07/30/22 1234 07/31/22 0332 07/31/22 0356 08/02/22 0500 08/02/22 1134 08/03/22 0421 08/03/22 0620 08/03/22 1334  NA 137 137   < > 147*   < > 154* 154* 155*  K 3.9 3.4*   < > 4.1  --  4.1  --   --   CL 100 107   < > 109  --  119*  --   --   CO2 24 23   < > 29  --  28  --   --   GLUCOSE 267* 192*   < > 260*  --  204*  --   --   BUN 23 19   < > 38*  --  34*  --   --   CREATININE 2.06* 1.50*   < > 1.57*  --  1.29*  --   --   CALCIUM 7.8* 6.5*   < > 8.3*  --  8.3*  --   --   MG 2.0 1.6*   < > 2.3  --  2.4  --   --   PHOS 2.3* 3.2  --   --   --   --   --   --    < > = values in this interval not displayed.    Lipid Panel:  Recent Labs  Lab 07/30/22 0110  TRIG 187*   HgbA1c:  Lab Results  Component Value Date   HGBA1C 7.8 (H) 07/30/2022   Urine Drug Screen:     Component Value Date/Time   LABOPIA NONE DETECTED 07/30/2022 0002   COCAINSCRNUR NONE DETECTED 07/30/2022 0002   LABBENZ NONE DETECTED 07/30/2022 0002   AMPHETMU NONE DETECTED 07/30/2022 0002   THCU NONE DETECTED 07/30/2022 0002    LABBARB NONE DETECTED 07/30/2022 0002    Alcohol Level     Component Value Date/Time   ETH <10 07/30/2022 0103    IMAGING  Results for orders placed or performed during the hospital encounter of 07/24/2022  MR BRAIN W WO CONTRAST   Narrative   CLINICAL DATA:  Anoxic brain injury  EXAM: MRI HEAD WITHOUT AND WITH CONTRAST  TECHNIQUE: Multiplanar, multiecho pulse sequences of the brain and surrounding structures were obtained without and with intravenous contrast.  CONTRAST:  22mL GADAVIST GADOBUTROL 1 MMOL/ML IV SOLN  COMPARISON:  Head CT from 3 days ago  FINDINGS: Brain: Diffuse restricted diffusion throughout the left cerebral hemisphere with some sparing  at the occipital lobe. Although seizure phenomenon is considered given the diffuse appearance, there is a vascular pattern with sparing of the posterior circulation. No bilateral cortical involvement for generalized seizure or global anoxic injury.  Dense restricted diffusion inferior right cerebellum with possible mild petechial hemorrhage by precontrast T1 weighted imaging. There is fourth ventricular mass effect but no hydrocephalus.  Vascular: Major flow voids and vascular enhancements are preserved.  Skull and upper cervical spine: Normal marrow signal  Sinuses/Orbits: Numerous retention cysts in the bilateral maxillary sinus. Negative orbits.  Insert PRA call rib  IMPRESSION: 1. Acute infarcts in the right PICA territory and left cerebral cortex respecting the left ICA distribution. 2. No hydrocephalus.   Electronically Signed   By: Jorje Guild M.D.   On: 08/01/2022 18:58   CT HEAD WO CONTRAST (5MM)   Narrative   CLINICAL DATA:  Acute neurologic deficit  EXAM: CT HEAD WITHOUT CONTRAST  TECHNIQUE: Contiguous axial images were obtained from the base of the skull through the vertex without intravenous contrast.  RADIATION DOSE REDUCTION: This exam was performed according to  the departmental dose-optimization program which includes automated exposure control, adjustment of the mA and/or kV according to patient size and/or use of iterative reconstruction technique.  COMPARISON:  None Available.  FINDINGS: Brain: Cytotoxic edema in the right cerebellar hemisphere and left frontal lobe, likely early subacute infarcts. There is moderate mass effect on the fourth ventricle. No hydrocephalus. No acute hemorrhage.  Vascular: No abnormal hyperdensity of the major intracranial arteries or dural venous sinuses. No intracranial atherosclerosis.  Skull: The visualized skull base, calvarium and extracranial soft tissues are normal.  Sinuses/Orbits: Near complete opacification of the maxillary sinuses. The orbits are normal.  IMPRESSION: 1. Cytotoxic edema in the right cerebellar hemisphere and left frontal lobe, likely early subacute infarcts. MRI would provide more detailed characterization. 2. Moderate mass effect on the fourth ventricle without hydrocephalus.  These results will be called to the ordering clinician or representative by the Radiologist Assistant, and communication documented in the PACS or [employer].   Electronically Signed   By: Ulyses Jarred M.D.   On: 08/01/2022 20:59     PHYSICAL EXAM General: Acutely ill male on mechanical ventilation. HEENT: [place]/AT. EOMI. Anicteric sclerae. ET tube stable in place Respiratory: Normal respiratory effort.  Mechanical lung sounds. Cardiovascular: Regular rate and rhythm. Extremities: Well-perfused. Edematous upper extremities.  Neuro: Remains intubated with light sedation.  Eyes open with left upper gaze.  Intact cornea and gag reflex.  No purposeful movements in the extremities and does not respond to painful stimuli.  ASSESSMENT/PLAN [name] is a 62 y.o. male with history of previous DVT/PE in 2005 not currently on coumadin, CHF, HLD, prostate cancer, SVT, T2DM who presented  with out of Hospital witnessed cardiac arrest with multiple rounds of CPR, multiple PEA arrests before ROSC with concern for prolonged low flow state. MRI brain shows large R PICA stroke along with a L ICA stroke. Due to prolonged cardiac arrest leading to anoxic brain injury, patient likely unable to have any meaningful neurological recovery. Prognosis continues to be poor. Eyes opened spontaneously but no awareness or purposeful movements. Will repeat EEG today.  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 Moderate mass effect on the fourth ventricle without hydrocephalus. MRI brain  acute infarcts in the right PICA territory and left cerebral cortex respecting the left ICA distribution. No hydrocephalus. Transcranial Doppler mild stenosis of the left ICA but otherwise unremarkable Carotid Doppler normal study 2D Echo EF 20-25%, G1DD, mild MR LDL N/A HgbA1c 7.8% Subcu heparin for VTE prophylaxis Diet Order             Diet NPO time specified  Diet effective now                  Therapy recommendations: Pending Disposition: Pending Sodium 155, hold hypertonic saline Sodium goal 150-1 55  Acute metabolic encephalopathy Secondary to low flow state in the setting of prolonged cardiac arrest Remains sedated and intubated Meaningful neurological recovery unlikely. Pending family meeting tomorrow at 1:30 pm  Hypertension Stable  Permissive hypertension (OK if < 220/120) but gradually normalize in 5-7 days  Long-term BP goal normotensive  Hyperlipidemia LDL N/A, goal < 70  Diabetes type II HgbA1c 7.8%, goal < 7.0 SSI with CBG monitoring  Other Active Problems Cardiogenic shock in the setting of cardiac arrest Acute hypoxic respiratory failure Diabetic ketoacidosis Acute kidney  injury Per PCCM Heart failure with reduced ejection fraction Per cardiology  Hospital day # 5  To contact Stroke Continuity provider, please refer to [email or web address] After hours, contact General Neurology  Lacinda Axon, MD 08/03/2022, 3:49 PM IM Resident, PGY-3 Oswaldo Milian 41:10  I have personally obtained history,examined this patient, reviewed notes, independently viewed imaging studies, participated in medical decision making and plan of care.ROS completed by me personally and pertinent positives fully documented  I have made any additions or clarifications directly to the above note. Agree with note above.  Patient neurological exam remains poor and compatible with near vegetative state with barely opening eyes but not responding.  Family is meeting with palliative care tomorrow to discuss goals of care.  He is unlikely to survive without prolonged ventilatory support, tracheostomy, PEG tube and 24-hour nursing home care and unlikely to improve to the point that he could go home.  Recommend repeat EEG for prognosis.  Discussed with Dr. Kimber Relic critical care medicine.This patient is critically ill and at significant risk of neurological worsening, death and care requires constant monitoring of vital signs, hemodynamics,respiratory and cardiac monitoring, extensive review of multiple databases, frequent neurological assessment, discussion with family, other specialists and medical decision making of high complexity.I have made any additions or clarifications directly to the above note.This critical care time does not reflect procedure time, or teaching time or supervisory time of PA/NP/Med Resident etc but could involve care discussion time.  I spent 30 minutes of neurocritical care time  in the care of  this patient.      Antony Contras, MD Medical Director [facility] Pager: [phone] 08/03/2022 4:20 PM

## 2022-08-03 NOTE — Progress Notes (Signed)
eLink Physician-Brief Progress Note Patient Name: Bobby Obrien DOB: 09-22-1960 MRN: [id number]   Date of Service  08/03/2022  HPI/Events of Note  Serum sodium 161. Patient is off hypertonic saline.  eICU Interventions  Free water ordered via OG tube, bedside RN instructed to contact neurology to determine if they want the serum sodium of 161 treated more aggressively.        Frederik Pear 08/03/2022, 9:45 PM

## 2022-08-03 NOTE — Progress Notes (Signed)
Heart Failure Navigator Progress Note  Following this hospitalization to assess for HV TOC readiness.   Patient admitted with Cardiac Arrest.  EF 20-25%, currently intubated.  Will continue to follow for possible HF TOC appointment.   Earnestine Leys, BSN, Clinical cytogeneticist Only

## 2022-08-03 NOTE — Procedures (Signed)
Patient Name: Bobby Obrien  MRN: [id number]  Epilepsy Attending: Lora Havens  Referring Physician/Provider: Lacinda Axon, MD  Date: 08/03/2022  Duration: 22.34 mins  Patient history: 62yo m with ams. EEG to evaluate for seizure  Level of alertness:  lethargic   AEDs during EEG study: None  Technical aspects: This EEG study was done with scalp electrodes positioned according to the 10-20 International system of electrode placement. Electrical activity was reviewed with band pass filter of 1-70Hz , sensitivity of 7 uV/mm, display speed of 59mm/sec with a 60Hz  notched filter applied as appropriate. EEG data were recorded continuously and digitally stored.  Video monitoring was available and reviewed as appropriate.  Description:EEG showed continuous generalized and lateralized left hemisphere 3 to 6 Hz theta-delta slowing. Qasi- periodic discharges, at times with triphasic morphology were noted in right hemisphere with fluctuating frequency of 1-2hz . Hyperventilation and photic stimulation were not performed.     ABNORMALITY - Continuous slow, generalized and lateralized left hemisphere - Periodic discharges, right hemisphere  IMPRESSION: This study is suggestive of cortical dysfunction arising from left hemisphere likely secondary to underlying structural abnormality. Additionally there are periodic discharges in right hemisphere with fluctuating frequency. The morphology and fluctuating frequency is more commonly indicative of anoxic -hypoxic brain injury in patients with cardiac arrest. Can also be seen due to toxic-metabolic causes like  cefepime toxicity. Lastly there is moderate to severe diffuse encephalopathy, non specific etiology.  Dr. Leonie Man was notified.  Bobby Obrien

## 2022-08-03 NOTE — Progress Notes (Signed)
EEG complete - results pending 

## 2022-08-03 NOTE — Progress Notes (Signed)
NAME:  Bobby Obrien, MRN:  [id number], DOB:  [birth year], LOS: 5 ADMISSION DATE:  07/27/2022, CONSULTATION DATE:  3/23 REFERRING MD:  Dr. Darl Householder, CHIEF COMPLAINT:  cardiac arrest   History of Present Illness:  Patient is a 62 year old male with pertinent PMH previous DVT/PE in 2005 not currently on coumadin, CHF, HLD, prostate cancer, SVT, T2DM presents to [facility] ED on 3/23 cardiac arrest.  On 3/23 patient was at a dinner doing well. Was dancing and got tired drinking a lot of water. Then had witnessed arrest around 12.  EMS arrived and started CPR.  Had intermittent ROSC.  Longest ROSC about 10 minutes.  EMS reports patient's rhythm would go into V. tach that requiring multiple shocks.  Per EMS EKG showing possible STEMI.  Patient given amio, mag, fluids and transported to [facility] ED.  Upon arrival to [facility] ED patient without pulses PEA arrest and CPR resumed.  After ROSC patient with sats 50s to 60s.  Thought to have issues with ETT position and patient reintubated. Patient had another PEA arrest while in ED and ROSC achieved. Patient arrested 5 different times with estimated total ROSC at least 30 minutes. Patient was coughing/gagging against ETT and was started on some sedation; not following commands. Hypotensive started on epi and levo. Bicarb drip started. CXR showing extensive b/l infiltrate vs. Pulm edema. EKG no ST elevation; prolonged qtc. K 2.8, mag 3.4, glucose 410, creat 1.77, calcium 8.5, AG 19, co2 13, wbc 12.4, troponin 563, bnp 77, ast 411, alt 478. Cards consulted. PCCM consulted for icu admission.  Pertinent  Medical History   Past Medical History:  Diagnosis Date   BPH associated with nocturia    History of asthma    childhood   History of CHF (congestive heart failure) 04/2004   in setting PE   History of pulmonary embolism 04/2004   with HF and hypertension;  bilateral lower lobe,  completed coumadin   (07-08-2021  pt stated never had clots before and none since, pt had negative  work-up for blood disorder at that time)   Hyperlipidemia    Hypertension    followed by pcp   (per cardiology note in epic 0302-2023 nuclear stress test 07-27-2004 normal  and echo 07-27-2005 (ef 50-55%)   Malignant neoplasm prostate ([place]) 01/2021   urologist--- dr wrenn/  radiation oncology-- dr Tammi Klippel;    dx 09/ 2022  Gleason 3+4, PSA 10.5   Panic disorder    Paroxysmal SVT (supraventricular tachycardia) ([place])    nonsustained;   ED visit 12-26-2020  SVT heart rate 2013 x2 doses adenosine;  followed up with pcp was referred to cardiology at the time but pt had no visit;  pt needed pcp clearance prior to surgery on 07-08-2021 and was referred to cardiology, office visit w/ cardiologist-- dr Stanton Kidney branch 07-07-2021, was cleared for surgery, pt to started taking toprol   Tachycardia    Type 2 diabetes mellitus ([place])    followed by pcp   (03-03-2023n pt stated checks blood sugar once daily , fasting (130s) and not fasting (165-170)   Wears partial dentures    upper    Significant Hospital Events: Including procedures, antibiotic start and stop dates in addition to other pertinent events   3/23 vtach arrest; ROSC about 30 minutes?; pccm consulted  3/26 Remains poorly responsive. MRI shows large R PICA stroke along with a L ICA stroke. Neuro consulted. Started on hypertonic saline  Interim History / Subjective:   Remains unresponsive on  vent.  Palliative met with family  Objective   Blood pressure (!) 146/84, pulse 84, temperature 100.2 F (37.9 C), resp. rate (!) 24, height 6' (1.829 m), weight 91.4 kg, SpO2 98 %. CVP:  [2 mmHg-10 mmHg] 7 mmHg  Vent Mode: PRVC FiO2 (%):  [50 %] 50 % Set Rate:  [24 bmp] 24 bmp Vt Set:  [460 mL] 460 mL PEEP:  [5 cmH20] 5 cmH20 Plateau Pressure:  [12 cmH20-17 cmH20] 13 cmH20   Intake/Output Summary (Last 24 hours) at 08/03/2022 0751 Last data filed at 08/03/2022 0600 Gross per 24 hour  Intake 2689.91 ml  Output 3425 ml  Net -735.09 ml   Filed  Weights   08/01/22 0446 08/02/22 1415 08/03/22 0415  Weight: 97.4 kg 94.4 kg 91.4 kg   Examination: Gen:      No acute distress HEENT:  EOMI, sclera anicteric Neck:     No masses; no thyromegaly, ETT Lungs:    Clear to auscultation bilaterally; normal respiratory effort CV:         Regular rate and rhythm; no murmurs Abd:      + bowel sounds; soft, non-tender; no palpable masses, no distension Ext:    No edema; adequate peripheral perfusion Skin:      Warm and dry; no rash Neuro: Unresponsive  Labs/imaging reviewed Significant for sodium 154, creatinine 1.29, AST 85, ALT 100 WBC 13.8, hemoglobin 11.9, platelets 155   Resolved Hospital Problem list    Anion gap metabolic acidosis with lactic acidosis.  Assessment & Plan:  Cardiogenic shock in setting of cardiac arrest. VT arrest in setting of hypokalemia. Hx of HFpEF, SVT. Echo reviewed with EF 0000000, grade 1 diastolic dysfunction.  Cardiology is on board May need cardiac ischemic evaluation if his neurostatus improves.  Acute hypoxic respiratory failure from aspiration pneumonitis and acute pulmonary edema. Continue vent support, low tidal volume ventilation Goal plateau pressure less than 30 Follow intermittent chest x-ray Continue Unasyn day 4/5 days  Acute PCA, ICA stoke Acute metabolic encephalopathy 2nd to hypoxia, DKA. Concern for anoxic injury Neuro consulted Gaol Na 150-155 on 3% saline Monitor neurostatus  AKI from ATN 2nd to shock, and DKA. Hypokalemia. Monitor urine outpt f/u BMET, electrolyes  Elevated LFTs Maybe from shock liver. Monitor labs  DKA. Continue Semglee, SSI, tube feed coverage  Constipation Bowel regimen. Increase miralax to bid  HTN Continue Norvasc, hydralazine as needed  Goals of care Prognosis for meaningful recovery is poor.  Palliative care met with family. Follow-up meeting scheduled for 3/29.  Best Practice (right click and "Reselect all SmartList Selections" daily)    Diet/type: tubefeeds DVT prophylaxis: prophylactic heparin  GI prophylaxis: H2B Lines: Central line and Arterial Line Foley:  Yes, and it is still needed Code Status:  full code Last date of multidisciplinary goals of care discussion [updated his wife at bedside]  Critical care time:    The patient is critically ill with multiple organ system failure and requires high complexity decision making for assessment and support, frequent evaluation and titration of therapies, advanced monitoring, review of radiographic studies and interpretation of complex data.   Critical Care Time devoted to patient care services, exclusive of separately billable procedures, described in this note is 35 minutes.   Marshell Garfinkel MD [facility] See Amion for pager  If no response to pager , please call [phone] until 7pm After 7:00 pm call Elink  [phone] 08/03/2022, 7:51 AM

## 2022-08-04 DIAGNOSIS — G931 Anoxic brain damage, not elsewhere classified: Secondary | ICD-10-CM

## 2022-08-04 DIAGNOSIS — Z66 Do not resuscitate: Secondary | ICD-10-CM

## 2022-08-04 LAB — SODIUM
Sodium: 159 mmol/L — ABNORMAL HIGH (ref 135–145)
Sodium: 161 mmol/L (ref 135–145)
Sodium: 154 mmol/L — ABNORMAL HIGH (ref 135–145)
Sodium: 158 mmol/L — ABNORMAL HIGH (ref 135–145)

## 2022-08-04 LAB — CBC
HCT: 36.3 % — ABNORMAL LOW (ref 39.0–52.0)
Hemoglobin: 11 g/dL — ABNORMAL LOW (ref 13.0–17.0)
MCH: 32.4 pg (ref 26.0–34.0)
MCHC: 30.3 g/dL (ref 30.0–36.0)
MCV: 106.8 fL — ABNORMAL HIGH (ref 80.0–100.0)
Platelets: 186 10*3/uL (ref 150–400)
RBC: 3.4 MIL/uL — ABNORMAL LOW (ref 4.22–5.81)
RDW: 12.6 % (ref 11.5–15.5)
WBC: 12.9 10*3/uL — ABNORMAL HIGH (ref 4.0–10.5)
nRBC: 0 % (ref 0.0–0.2)

## 2022-08-04 LAB — COMPREHENSIVE METABOLIC PANEL
ALT: 80 U/L — ABNORMAL HIGH (ref 0–44)
AST: 75 U/L — ABNORMAL HIGH (ref 15–41)
Albumin: 2.4 g/dL — ABNORMAL LOW (ref 3.5–5.0)
Alkaline Phosphatase: 82 U/L (ref 38–126)
Anion gap: 11 (ref 5–15)
BUN: 40 mg/dL — ABNORMAL HIGH (ref 8–23)
CO2: 27 mmol/L (ref 22–32)
Calcium: 8.6 mg/dL — ABNORMAL LOW (ref 8.9–10.3)
Chloride: 121 mmol/L — ABNORMAL HIGH (ref 98–111)
Creatinine, Ser: 1.63 mg/dL — ABNORMAL HIGH (ref 0.61–1.24)
GFR, Estimated: 47 mL/min — ABNORMAL LOW (ref >60)
Glucose, Bld: 240 mg/dL — ABNORMAL HIGH (ref 70–99)
Potassium: 4.1 mmol/L (ref 3.5–5.1)
Sodium: 159 mmol/L — ABNORMAL HIGH (ref 135–145)
Total Bilirubin: 1.3 mg/dL — ABNORMAL HIGH (ref 0.3–1.2)
Total Protein: 6.2 g/dL — ABNORMAL LOW (ref 6.5–8.1)

## 2022-08-04 LAB — GLUCOSE, CAPILLARY
Glucose-Capillary: 176 mg/dL — ABNORMAL HIGH (ref 70–99)
Glucose-Capillary: 172 mg/dL — ABNORMAL HIGH (ref 70–99)
Glucose-Capillary: 164 mg/dL — ABNORMAL HIGH (ref 70–99)
Glucose-Capillary: 229 mg/dL — ABNORMAL HIGH (ref 70–99)
Glucose-Capillary: 252 mg/dL — ABNORMAL HIGH (ref 70–99)
Glucose-Capillary: 215 mg/dL — ABNORMAL HIGH (ref 70–99)

## 2022-08-04 LAB — CULTURE, BLOOD (ROUTINE X 2)
Culture: NO GROWTH
Culture: NO GROWTH
Special Requests: ADEQUATE

## 2022-08-04 MED ORDER — FAMOTIDINE 20 MG PO TABS
20.0000 mg | ORAL_TABLET | Freq: Every day | ORAL | Status: DC
  Administered 2022-08-04 – 2022-08-06 (×3): 20 mg
  Filled 2022-08-04 (×3): qty 1

## 2022-08-04 MED ORDER — INSULIN ASPART 100 UNIT/ML IJ SOLN
0.0000 [IU] | INTRAMUSCULAR | Status: DC
  Administered 2022-08-04: 11 [IU] via SUBCUTANEOUS
  Administered 2022-08-04: 7 [IU] via SUBCUTANEOUS
  Administered 2022-08-05: 4 [IU] via SUBCUTANEOUS
  Administered 2022-08-05 (×2): 7 [IU] via SUBCUTANEOUS
  Administered 2022-08-05: 4 [IU] via SUBCUTANEOUS
  Administered 2022-08-05 (×2): 7 [IU] via SUBCUTANEOUS
  Administered 2022-08-05: 4 [IU] via SUBCUTANEOUS
  Administered 2022-08-06 (×2): 11 [IU] via SUBCUTANEOUS
  Administered 2022-08-06: 7 [IU] via SUBCUTANEOUS

## 2022-08-04 MED ORDER — METOPROLOL TARTRATE 25 MG PO TABS
25.0000 mg | ORAL_TABLET | Freq: Two times a day (BID) | ORAL | Status: DC
  Administered 2022-08-04 – 2022-08-06 (×5): 25 mg
  Filled 2022-08-04 (×5): qty 1

## 2022-08-04 MED ORDER — FREE WATER: 200.0000 mL | Status: DC

## 2022-08-04 MED ORDER — HYDRALAZINE HCL 20 MG/ML IJ SOLN
10.0000 mg | INTRAMUSCULAR | Status: DC | PRN
  Administered 2022-08-04 – 2022-08-06 (×3): 10 mg via INTRAVENOUS
  Filled 2022-08-04 (×4): qty 1

## 2022-08-04 MED ORDER — FREE WATER
200.0000 mL | Status: DC
  Administered 2022-08-04 – 2022-08-05 (×4): 200 mL

## 2022-08-04 MED ORDER — DEXTROSE 5 % IV SOLN: INTRAVENOUS | Status: DC

## 2022-08-04 MED ORDER — MIDAZOLAM HCL 2 MG/2ML IJ SOLN
4.0000 mg | INTRAMUSCULAR | Status: DC | PRN
  Administered 2022-08-04 – 2022-08-06 (×4): 4 mg via INTRAVENOUS
  Filled 2022-08-04 (×5): qty 4

## 2022-08-04 MED ORDER — BISACODYL 10 MG RE SUPP: 10.0000 mg | Freq: Once | RECTAL | Status: DC

## 2022-08-04 MED ORDER — INSULIN ASPART 100 UNIT/ML IJ SOLN
6.0000 [IU] | INTRAMUSCULAR | Status: DC
  Administered 2022-08-04 – 2022-08-06 (×12): 6 [IU] via SUBCUTANEOUS

## 2022-08-04 MED ORDER — HYDRALAZINE HCL 10 MG PO TABS
10.0000 mg | ORAL_TABLET | Freq: Three times a day (TID) | ORAL | Status: DC
  Administered 2022-08-04 – 2022-08-06 (×6): 10 mg
  Filled 2022-08-04 (×8): qty 1

## 2022-08-04 NOTE — Progress Notes (Signed)
Daily Progress Note   Patient Name: Bobby Obrien       Date: 08/04/2022 DOB: May 20, 1960  Age: 62 y.o. MRN#: [id number] Attending Physician: Bobby Seeds, MD Primary Care Physician: Bobby Medin, MD Admit Date: 07/08/2022  Reason for Consultation/Follow-up: Establishing goals of care  Subjective: Remains intubated, family at bedside  Length of Stay: 6  Current Medications: Scheduled Meds:   amLODipine  10 mg Per Tube Daily   bisacodyl  10 mg Rectal Once   Chlorhexidine Gluconate Cloth  6 each Topical Q0600   docusate  100 mg Per Tube BID   famotidine  20 mg Per Tube Daily   feeding supplement (PROSource TF20)  60 mL Per Tube Daily   free water  200 mL Per Tube Q4H   heparin injection (subcutaneous)  5,000 Units Subcutaneous Q8H   insulin aspart  0-20 Units Subcutaneous Q4H   insulin aspart  6 Units Subcutaneous Q4H   insulin glargine-yfgn  35 Units Subcutaneous Daily   metoprolol tartrate  25 mg Per Tube BID   mouth rinse  15 mL Mouth Rinse Q2H   polyethylene glycol  17 g Per Tube BID   sennosides  10 mL Per Tube BID   sodium chloride flush  10-40 mL Intracatheter Q12H    Continuous Infusions:  sodium chloride Stopped (08/04/22 0514)   dextrose 100 mL/hr at 08/04/22 1451   feeding supplement (VITAL 1.5 CAL) 55 mL/hr at 08/04/22 1400   fentaNYL infusion INTRAVENOUS 125 mcg/hr (08/04/22 1400)    PRN Meds: acetaminophen, dextrose, docusate, fentaNYL, fentaNYL (SUBLIMAZE) injection, ibuprofen, labetalol, midazolam, mouth rinse, polyethylene glycol, sodium chloride flush  Physical Exam Constitutional:      General: He is not in acute distress.    Appearance: He is ill-appearing.     Comments: Remains on ventilator  Musculoskeletal:     Right lower leg: No edema.     Left lower  leg: No edema.  Skin:    General: Skin is warm and dry.             Vital Signs: BP (!) 169/74   Pulse 92   Temp (!) 100.9 F (38.3 C)   Resp 16   Ht 6' (1.829 m)   Wt 84.6 kg   SpO2 94%   BMI 25.30 kg/m  SpO2: SpO2: 94 % O2 Device: O2 Device: Ventilator O2 Flow Rate: O2 Flow Rate (L/min): 40 L/min  Intake/output summary:  Intake/Output Summary (Last 24 hours) at 08/04/2022 1533 Last data filed at 08/04/2022 1400 Gross per 24 hour  Intake 3677.89 ml  Output 3425 ml  Net 252.89 ml    LBM: Last BM Date : 08/03/22 Baseline Weight: Weight: 87.8 kg Most recent weight: Weight: 84.6 kg  Patient Active Problem List   Diagnosis Date Noted   Cardiac arrest ([place]) 07/11/2022   Hypokalemia 07/08/2022   Acute respiratory failure with hypoxia ([place]) 07/23/2022   Malignant neoplasm of prostate ([place]) 04/05/2021   Diabetes mellitus without complication ([place]) [id number]   Visit for preventive health examination 11/22/2012   Statin intolerance 11/20/2012   Does not have health insurance 09/03/2011   Preventive measure 06/21/2010   POSTNASAL DRIP SYNDROME 01/21/2007  DIABETES MELLITUS, TYPE II 12/04/2006   HYPERLIPIDEMIA 12/04/2006   HYPERTENSION 12/04/2006   DISEASE, HYPERTENSIVE HEART, BENIGN, W/HF 12/04/2006   Personal history of venous thrombosis and embolism 12/04/2006    Palliative Care Assessment & Plan   HPI: 62 y.o. male  with past medical history of previous DVT/PE in 2005 not currently on coumadin, CHF, HLD, prostate cancer, SVT, T2DM presents to [facility] ED on 3/23 cardiac arrest.  He was admitted on 07/14/2022 with cardiogenic shock in the setting of cardiac arrest, VT arrest in the setting of hyperkalemia, history of HFpEF, acute hypoxic respiratory failure from aspiration pneumonitis and acute pulmonary edema, acute PTA, ICH stroke, concern for anoxic injury, AKI secondary to shock and DKA, and others.    PMT was consulted for Bobby Obrien conversations.  Assessment: Family  meeting today with multiple family members to include patient's wife, daughter, siblings. Dr. Leonie Obrien provided family with update on patient's neurological status and concern that patient has had no improvement in mental status or is unable to meaningfully interact with his environment.  Situation discussed extensively and all questions and concerns were answered. Discussed with family options to continue with current aggressive medical care versus a transition to focus on patient's comfort.  We discussed that at baseline Bobby Obrien is a very lively Obrien and what current situation would mean for his quality of life.  We discussed how we would support Bobby Obrien's comfort if we were to free him from the ventilator.  Discussed what to expect.  All questions and concerns answered  We specifically discussed how to serve as surrogate decision makers -keeping the patient at the center and attempting to make decisions they would make for themselves.  Encouraged patient/family to consider DNR status understanding evidenced based poor outcomes in similar hospitalized patients, as the cause of the arrest is likely associated with chronic/terminal disease rather than a reversible acute cardio-pulmonary event.  Family agrees DNR is appropriate.  Family request time to talk amongst themselves about transition to comfort measures only.   Recommendations/Plan: CODE STATUS changed to DNR per discussion above Family considering comfort measures, requested time to talk amongst themselves prior to making decision  Care plan was discussed with Dr. Leonie Man, RN, patient's wife, patient's sister and multiple other family members  Thank you for allowing the Palliative Medicine Team to assist in the care of this patient.  *Please note that this is a verbal dictation therefore any spelling or grammatical errors are due to the "[place] [employer]" system interpretation.  Bobby Burrow, DNP, [facility] Palliative Medicine Team Team  Phone # [phone]  Pager [phone]

## 2022-08-04 NOTE — Progress Notes (Signed)
PRN's administered

## 2022-08-04 NOTE — Progress Notes (Addendum)
STROKE TEAM PROGRESS NOTE   SUBJECTIVE (INTERVAL HISTORY) Repeat EEG shows signs of anoxic-hypoxic brain injury as well as cortical dysfunction from the left hemisphere Patient evaluated in the morning with daughter in the room He remains unresponsive with no neurological progress compared to yesterday Neurology team and palliative care had family meeting with multiple family members in the afternoon to discuss patient's current vegetative state.  All family members updated on patient's poor prognosis. Pending family decision concerning comfort care versus continuing current management  OBJECTIVE Vitals:   08/04/22 1000 08/04/22 1100 08/04/22 1200 08/04/22 1201  BP:      Pulse: 90 95 84   Resp: (!) 24 (!) 24 (!) 24   Temp: (!) 101.3 F (38.5 C) (!) 101.5 F (38.6 C) (!) 101.3 F (38.5 C)   TempSrc:      SpO2: 95% 96%  95%  Weight:      Height:        CBC:  Recent Labs  Lab 08/03/22 0421 08/04/22 0359  WBC 13.8* 12.9*  HGB 11.1* 11.0*  HCT 36.6* 36.3*  MCV 105.2* 106.8*  PLT 155 [id number]    Basic Metabolic Panel:  Recent Labs  Lab 07/30/22 1234 07/31/22 0332 07/31/22 0356 08/02/22 0500 08/02/22 1134 08/03/22 0421 08/03/22 0620 08/04/22 0025 08/04/22 0359  NA 137 137   < > 147*   < > 154*   < > 159* 159*  K 3.9 3.4*   < > 4.1  --  4.1  --   --  4.1  CL 100 107   < > 109  --  119*  --   --  121*  CO2 24 23   < > 29  --  28  --   --  27  GLUCOSE 267* 192*   < > 260*  --  204*  --   --  240*  BUN 23 19   < > 38*  --  34*  --   --  40*  CREATININE 2.06* 1.50*   < > 1.57*  --  1.29*  --   --  1.63*  CALCIUM 7.8* 6.5*   < > 8.3*  --  8.3*  --   --  8.6*  MG 2.0 1.6*   < > 2.3  --  2.4  --   --   --   PHOS 2.3* 3.2  --   --   --   --   --   --   --    < > = values in this interval not displayed.    Lipid Panel:  Recent Labs  Lab 07/30/22 0110  TRIG 187*   HgbA1c:  Lab Results  Component Value Date   HGBA1C 7.8 (H) 07/30/2022   Urine Drug Screen:      Component Value Date/Time   LABOPIA NONE DETECTED 07/30/2022 0002   COCAINSCRNUR NONE DETECTED 07/30/2022 0002   LABBENZ NONE DETECTED 07/30/2022 0002   AMPHETMU NONE DETECTED 07/30/2022 0002   THCU NONE DETECTED 07/30/2022 0002   LABBARB NONE DETECTED 07/30/2022 0002    Alcohol Level     Component Value Date/Time   ETH <10 07/30/2022 0103    IMAGING  Results for orders placed or performed during the hospital encounter of 07/23/2022  MR BRAIN W WO CONTRAST   Narrative   CLINICAL DATA:  Anoxic brain injury  EXAM: MRI HEAD WITHOUT AND WITH CONTRAST  TECHNIQUE: Multiplanar, multiecho pulse sequences of the brain and surrounding structures  were obtained without and with intravenous contrast.  CONTRAST:  45mL GADAVIST GADOBUTROL 1 MMOL/ML IV SOLN  COMPARISON:  Head CT from 3 days ago  FINDINGS: Brain: Diffuse restricted diffusion throughout the left cerebral hemisphere with some sparing at the occipital lobe. Although seizure phenomenon is considered given the diffuse appearance, there is a vascular pattern with sparing of the posterior circulation. No bilateral cortical involvement for generalized seizure or global anoxic injury.  Dense restricted diffusion inferior right cerebellum with possible mild petechial hemorrhage by precontrast T1 weighted imaging. There is fourth ventricular mass effect but no hydrocephalus.  Vascular: Major flow voids and vascular enhancements are preserved.  Skull and upper cervical spine: Normal marrow signal  Sinuses/Orbits: Numerous retention cysts in the bilateral maxillary sinus. Negative orbits.  Insert PRA call rib  IMPRESSION: 1. Acute infarcts in the right PICA territory and left cerebral cortex respecting the left ICA distribution. 2. No hydrocephalus.   Electronically Signed   By: Jorje Guild M.D.   On: 08/01/2022 18:58   CT HEAD WO CONTRAST (5MM)   Narrative   CLINICAL DATA:  Acute neurologic  deficit  EXAM: CT HEAD WITHOUT CONTRAST  TECHNIQUE: Contiguous axial images were obtained from the base of the skull through the vertex without intravenous contrast.  RADIATION DOSE REDUCTION: This exam was performed according to the departmental dose-optimization program which includes automated exposure control, adjustment of the mA and/or kV according to patient size and/or use of iterative reconstruction technique.  COMPARISON:  None Available.  FINDINGS: Brain: Cytotoxic edema in the right cerebellar hemisphere and left frontal lobe, likely early subacute infarcts. There is moderate mass effect on the fourth ventricle. No hydrocephalus. No acute hemorrhage.  Vascular: No abnormal hyperdensity of the major intracranial arteries or dural venous sinuses. No intracranial atherosclerosis.  Skull: The visualized skull base, calvarium and extracranial soft tissues are normal.  Sinuses/Orbits: Near complete opacification of the maxillary sinuses. The orbits are normal.  IMPRESSION: 1. Cytotoxic edema in the right cerebellar hemisphere and left frontal lobe, likely early subacute infarcts. MRI would provide more detailed characterization. 2. Moderate mass effect on the fourth ventricle without hydrocephalus.  These results will be called to the ordering clinician or representative by the Radiologist Assistant, and communication documented in the PACS or [employer].   Electronically Signed   By: Ulyses Jarred M.D.   On: 08/01/2022 20:59     PHYSICAL EXAM General: Acutely ill male on mechanical ventilation. HEENT: [place]/AT. EOMI. Anicteric sclerae. ET tube stable in place Respiratory: Normal respiratory effort.  Mechanical lung sounds. Cardiovascular: Regular rate and rhythm. Extremities: Well-perfused. Edematous upper extremities.  Neuro: Neuroexam unchanged.  He remains intubated with light sedation.  Eyes open with left upper gaze.  Intact corneal and gag  reflex.  No purposeful movement in extremities.  Does not respond to painful stimuli.  ASSESSMENT/PLAN [name] is a 62 y.o. male with history of previous DVT/PE in 2005 not currently on coumadin, CHF, HLD, prostate cancer, SVT, T2DM who presented with out of Hospital witnessed cardiac arrest with multiple rounds of CPR, multiple PEA arrests before ROSC with concern for prolonged low flow state. MRI brain shows large R PICA stroke along with a L ICA stroke. Due to prolonged cardiac arrest leading to anoxic brain injury, patient likely unable to have any meaningful neurological recovery.  Repeat EEG yesterday did not show any signs of neurological improvement.  Family meeting completed today, pending family decision about transitioning to comfort care versus  continuing current management  Stroke acute right PICA and L ICA infarct likely secondary to low flow state in the setting of prolonged PEA cardiac arrest CT head: Cytotoxic edema in the right cerebellar hemisphere and left frontal lobe, likely early subacute infarcts. Moderate mass effect on the fourth ventricle without hydrocephalus. MRI brain acute infarcts in the right PICA territory and left cerebral cortex respecting the left ICA distribution. No hydrocephalus. Transcranial Doppler mild stenosis of the left ICA but otherwise unremarkable Carotid Doppler normal study 2D Echo EF 20-25%, G1DD, mild MR LDL N/A HgbA1c 7.8% Subcu heparin for VTE prophylaxis Diet Order             Diet NPO time specified  Diet effective now                  Therapy recommendations: Pending Disposition: Pending Sodium 159, continue to hold hypertonic saline, start free water flushes continue on sodium monitoring. Sodium goal 0000000  Acute metabolic encephalopathy Secondary to low flow state in the setting of prolonged cardiac arrest Remains sedated and intubated Meaningful neurological recovery unlikely. Repeat EEG shows evidence of  anoxic-hypoxic brain injury, cortical dysfunction from the left hemisphere as well as moderate to severe diffuse encephalopathy  Hypertension Stable  Permissive hypertension (OK if < 220/120) but gradually normalize in 5-7 days  Long-term BP goal normotensive  Hyperlipidemia LDL N/A, goal < 70  Diabetes type II HgbA1c 7.8%, goal < 7.0 SSI with CBG monitoring  Other Active Problems Cardiogenic shock in the setting of cardiac arrest Acute hypoxic respiratory failure Diabetic ketoacidosis, resolved Acute kidney injury Per PCCM Heart failure with reduced ejection fraction Per cardiology  Hospital day # 6  To contact Stroke Continuity provider, please refer to [email or web address] After hours, contact General Neurology  Lacinda Axon, MD 08/04/2022, 12:59 PM IM Resident, PGY-3 Oswaldo Milian 41:10  I have personally obtained history,examined this patient, reviewed notes, independently viewed imaging studies, participated in medical decision making and plan of care.ROS completed by me personally and pertinent positives fully documented  I have made any additions or clarifications directly to the above note. Agree with note above.  Patient neurological exam remains quite poor with near vegetative state with only intermittently opening eyes partially to stimulation but not blinking.  Unreactive pupils and sluggishly reactive corneals and weak cough and no motor response to painful stimuli.  I had a long discussion with the patient daughter at bedside about his prognosis during a.m. rounds and subsequently in the afternoon and a family meeting with multiple family members along with the palliative care nurse practitioner about his prognosis and goals of care.  Family to make a decision about withdrawal of ventilatory support hopefully soon.  Discussed with critical care team.This patient is critically ill and at significant risk of neurological worsening, death and care requires constant monitoring of  vital signs, hemodynamics,respiratory and cardiac monitoring, extensive review of multiple databases, frequent neurological assessment, discussion with family, other specialists and medical decision making of high complexity.I have made any additions or clarifications directly to the above note.This critical care time does not reflect procedure time, or teaching time or supervisory time of PA/NP/Med Resident etc but could involve care discussion time.  I spent 30 minutes of neurocritical care time  in the care of  this patient.      Antony Contras, MD Medical Director [facility] Pager: [phone] 08/04/2022 3:20 PM

## 2022-08-04 NOTE — Progress Notes (Signed)
NAME:  KENNIS NAEGELE, MRN:  [id number], DOB:  1960/08/28, LOS: 6 ADMISSION DATE:  07/11/2022, CONSULTATION DATE:  3/23 REFERRING MD:  Dr. Darl Householder, CHIEF COMPLAINT:  cardiac arrest   History of Present Illness:  Patient is a 62 year old male with pertinent PMH previous DVT/PE in 2005 not currently on coumadin, CHF, HLD, prostate cancer, SVT, T2DM presents to [facility] ED on 3/23 cardiac arrest.  On 3/23 patient was at a dinner doing well. Was dancing and got tired drinking a lot of water. Then had witnessed arrest around 16.  EMS arrived and started CPR.  Had intermittent ROSC.  Longest ROSC about 10 minutes.  EMS reports patient's rhythm would go into V. tach that requiring multiple shocks.  Per EMS EKG showing possible STEMI.  Patient given amio, mag, fluids and transported to [facility] ED.  Upon arrival to [facility] ED patient without pulses PEA arrest and CPR resumed.  After ROSC patient with sats 50s to 60s.  Thought to have issues with ETT position and patient reintubated. Patient had another PEA arrest while in ED and ROSC achieved. Patient arrested 5 different times with estimated total ROSC at least 30 minutes. Patient was coughing/gagging against ETT and was started on some sedation; not following commands. Hypotensive started on epi and levo. Bicarb drip started. CXR showing extensive b/l infiltrate vs. Pulm edema. EKG no ST elevation; prolonged qtc. K 2.8, mag 3.4, glucose 410, creat 1.77, calcium 8.5, AG 19, co2 13, wbc 12.4, troponin 563, bnp 77, ast 411, alt 478. Cards consulted. PCCM consulted for icu admission.  Pertinent  Medical History   Past Medical History:  Diagnosis Date   BPH associated with nocturia    History of asthma    childhood   History of CHF (congestive heart failure) 04/2004   in setting PE   History of pulmonary embolism 04/2004   with HF and hypertension;  bilateral lower lobe,  completed coumadin   (07-08-2021  pt stated never had clots before and none since, pt had negative  work-up for blood disorder at that time)   Hyperlipidemia    Hypertension    followed by pcp   (per cardiology note in epic 0302-2023 nuclear stress test 07-27-2004 normal  and echo 07-27-2005 (ef 50-55%)   Malignant neoplasm prostate ([place]) 01/2021   urologist--- dr wrenn/  radiation oncology-- dr Tammi Klippel;    dx 09/ 2022  Gleason 3+4, PSA 10.5   Panic disorder    Paroxysmal SVT (supraventricular tachycardia) ([place])    nonsustained;   ED visit 12-26-2020  SVT heart rate 2013 x2 doses adenosine;  followed up with pcp was referred to cardiology at the time but pt had no visit;  pt needed pcp clearance prior to surgery on 07-08-2021 and was referred to cardiology, office visit w/ cardiologist-- dr Stanton Kidney branch 07-07-2021, was cleared for surgery, pt to started taking toprol   Tachycardia    Type 2 diabetes mellitus ([place])    followed by pcp   (03-03-2023n pt stated checks blood sugar once daily , fasting (130s) and not fasting (165-170)   Wears partial dentures    upper    Significant Hospital Events: Including procedures, antibiotic start and stop dates in addition to other pertinent events   3/23 vtach arrest; ROSC about 30 minutes?; pccm consulted  3/26 Remains poorly responsive. MRI shows large R PICA stroke along with a L ICA stroke. Neuro consulted. Started on hypertonic saline 3/27 Hypertonic discontinued 3/28 EEG with no seizures. Possible anoxic  brain injury vs toxic-metabolic   Interim History / Subjective:   Minimal vent settings Minimally responsive with +cough/gag, eyes open to noxious stimulation, no tracking Updated daughter at bedside. Scheduled for palliative and neuro meeting this morning  Started on free water overnight EEG with no seizures. Possible anoxic brain injury vs toxic-metabolic   Objective   Blood pressure (!) 169/74, pulse (!) 112, temperature 100.2 F (37.9 C), temperature source Esophageal, resp. rate (!) 22, height 6' 09-06-22 m), weight 84.6 kg, SpO2 99  %. CVP:  [3 mmHg-10 mmHg] 3 mmHg  Vent Mode: PRVC FiO2 (%):  [40 %] 40 % Set Rate:  [24 bmp] 24 bmp Vt Set:  [460 mL] 460 mL PEEP:  [5 cmH20] 5 cmH20 Plateau Pressure:  [15 cmH20-16 cmH20] 15 cmH20   Intake/Output Summary (Last 24 hours) at 08/04/2022 [id number] Last data filed at 08/04/2022 0600 Gross per 24 hour  Intake 3001.05 ml  Output 2975 ml  Net 26.05 ml   Filed Weights   08/02/22 1415 08/03/22 0415 08/04/22 0500  Weight: 94.4 kg 91.4 kg 84.6 kg   Physical Exam: General: Critically ill-appearing, no acute distress HENT: [place], AT, ETT in place Eyes: EOMI, no scleral icterus Respiratory: Clear to auscultation bilaterally.  No crackles, wheezing or rales Cardiovascular: RRR, -M/R/G, no JVD GI: BS+, soft, nontender Extremities:-Edema,-tenderness Neuro: Eyes open, no tracking, does not follow commands, +cough/gag/corneal reflex  Na 161>159 BUN/Cr 40/1.63 AST and ALT slightly improving 75, 80 respectively WBC improved 12.9  Resolved Hospital Problem list    Anion gap metabolic acidosis with lactic acidosis.  Assessment & Plan:  Cardiogenic shock in setting of cardiac arrest. VT arrest in setting of hypokalemia. Hx of HFpEF, SVT. HTN Echo reviewed with EF 0000000, grade 1 diastolic dysfunction.   Seen by Cards 3/25. May need cardiac ischemic evaluation if his neurostatus improves. Start metoprolol 25 mg BID  Acute hypoxic respiratory failure from aspiration pneumonitis and acute pulmonary edema. S/p Unasyn x 5d ended 3/28 Full vent support LTVV, 4-8cc/kg IBW with goal Pplat<30 and DP<15 Goal plateau pressure less than 30 Follow intermittent chest x-ray  Acute PCA, ICA stoke with edema in right cerebellar and left front lobe with brain compression on 4th ventricle Acute toxic metabolic encephalopathy 2nd to hypoxia, DKA. Concern for anoxic injury Neuro checks Appreciate Neuro input Goal Na 150-155. Off hypertonic. On FWF   Iatrogenic hypernatremia 1/2 NS  @100cc /h FWF Na q6h  AKI from ATN 2nd to shock, and DKA. - good UOP. Worsening Cr Monitor UOP/Cr f/u BMET, electrolyes  Elevated LFTs - improving Maybe from shock liver. Monitor labs  DKA - resolved Continue Semglee, SSI, tube feed coverage  Constipation Bowel regimen. Miralax to bid  HTN Continue Norvasc, hydralazine as needed  Goals of care Prognosis for meaningful recovery is poor.  Palliative care following Follow-up meeting scheduled for 3/29.  Best Practice (right click and "Reselect all SmartList Selections" daily)   Diet/type: tubefeeds DVT prophylaxis: prophylactic heparin  GI prophylaxis: H2B Lines: Central line and Arterial Line Foley:  Yes, and it is still needed Code Status:  full code Last date of multidisciplinary goals of care discussion [updated daughter this morning]  Critical care time:    The patient is critically ill with multiple organ systems failure and requires high complexity decision making for assessment and support, frequent evaluation and titration of therapies, application of advanced monitoring technologies and extensive interpretation of multiple databases.  Independent Critical Care Time: 60 Minutes.   [name], M.D.  [facility] 08/04/2022 8:22 AM   Please see Amion for pager number to reach on-call Pulmonary and Critical Care Team.

## 2022-08-04 NOTE — Progress Notes (Signed)
[facility] Progress Note Patient Name: Bobby Obrien DOB: 06-09-1960 MRN: [id number]   Date of Service  08/04/2022  HPI/Events of Note  Serum sodium down to 155 from 161 overnight on D 5 % water gtt + free water via OG tube, goal serum sodium is 150-155.  eICU Interventions  Will discontinue D 5 % gtt, continue free water, trend serum sodium closely.        Frederik Pear 08/04/2022, 8:46 PM

## 2022-08-04 NOTE — Progress Notes (Addendum)
Sodium 161 @ 12:55. Goal sodium 150-155.   -Change 1/2 NS to D5 100 cc/h. Increase SSI to resistant.  -Increase free water flushes to 200 cc Q4h -Recheck Sodium in 6h

## 2022-08-05 LAB — COMPREHENSIVE METABOLIC PANEL
ALT: 57 U/L — ABNORMAL HIGH (ref 0–44)
AST: 63 U/L — ABNORMAL HIGH (ref 15–41)
Albumin: 2 g/dL — ABNORMAL LOW (ref 3.5–5.0)
Alkaline Phosphatase: 70 U/L (ref 38–126)
Anion gap: 10 (ref 5–15)
BUN: 42 mg/dL — ABNORMAL HIGH (ref 8–23)
CO2: 27 mmol/L (ref 22–32)
Calcium: 8.4 mg/dL — ABNORMAL LOW (ref 8.9–10.3)
Chloride: 118 mmol/L — ABNORMAL HIGH (ref 98–111)
Creatinine, Ser: 1.73 mg/dL — ABNORMAL HIGH (ref 0.61–1.24)
GFR, Estimated: 44 mL/min — ABNORMAL LOW (ref >60)
Glucose, Bld: 266 mg/dL — ABNORMAL HIGH (ref 70–99)
Potassium: 4.1 mmol/L (ref 3.5–5.1)
Sodium: 155 mmol/L — ABNORMAL HIGH (ref 135–145)
Total Bilirubin: 1 mg/dL (ref 0.3–1.2)
Total Protein: 6 g/dL — ABNORMAL LOW (ref 6.5–8.1)

## 2022-08-05 LAB — CBC
HCT: 30.8 % — ABNORMAL LOW (ref 39.0–52.0)
Hemoglobin: 8.8 g/dL — ABNORMAL LOW (ref 13.0–17.0)
MCH: 31.8 pg (ref 26.0–34.0)
MCHC: 28.6 g/dL — ABNORMAL LOW (ref 30.0–36.0)
MCV: 111.2 fL — ABNORMAL HIGH (ref 80.0–100.0)
Platelets: 202 10*3/uL (ref 150–400)
RBC: 2.77 MIL/uL — ABNORMAL LOW (ref 4.22–5.81)
RDW: 12.9 % (ref 11.5–15.5)
WBC: 8.1 10*3/uL (ref 4.0–10.5)
nRBC: 0 % (ref 0.0–0.2)

## 2022-08-05 LAB — GLUCOSE, CAPILLARY
Glucose-Capillary: 182 mg/dL — ABNORMAL HIGH (ref 70–99)
Glucose-Capillary: 192 mg/dL — ABNORMAL HIGH (ref 70–99)
Glucose-Capillary: 196 mg/dL — ABNORMAL HIGH (ref 70–99)
Glucose-Capillary: 219 mg/dL — ABNORMAL HIGH (ref 70–99)
Glucose-Capillary: 238 mg/dL — ABNORMAL HIGH (ref 70–99)
Glucose-Capillary: 238 mg/dL — ABNORMAL HIGH (ref 70–99)

## 2022-08-05 LAB — SODIUM
Sodium: 159 mmol/L — ABNORMAL HIGH (ref 135–145)
Sodium: 160 mmol/L — ABNORMAL HIGH (ref 135–145)

## 2022-08-05 MED ORDER — INSULIN GLARGINE-YFGN 100 UNIT/ML ~~LOC~~ SOLN
20.0000 [IU] | Freq: Two times a day (BID) | SUBCUTANEOUS | Status: DC
  Administered 2022-08-05 – 2022-08-06 (×3): 20 [IU] via SUBCUTANEOUS
  Filled 2022-08-05 (×4): qty 0.2

## 2022-08-05 MED ORDER — FREE WATER
200.0000 mL | Freq: Three times a day (TID) | Status: DC
  Administered 2022-08-05 – 2022-08-06 (×3): 200 mL

## 2022-08-05 NOTE — Progress Notes (Signed)
Daily Progress Note   Patient Name: Bobby Obrien       Date: 08/05/2022 DOB: 09-Mar-1961  Age: 62 y.o. MRN#: [id number] Attending Physician: Margaretha Seeds, MD Primary Care Physician: Burnis Medin, MD Admit Date: 07/15/2022  Reason for Consultation/Follow-up: Establishing goals of care  Subjective: Unresponsive, remains intubated, daughter at bedside.   Length of Stay: 7  Current Medications: Scheduled Meds:   amLODipine  10 mg Per Tube Daily   bisacodyl  10 mg Rectal Once   Chlorhexidine Gluconate Cloth  6 each Topical Q0600   docusate  100 mg Per Tube BID   famotidine  20 mg Per Tube Daily   feeding supplement (PROSource TF20)  60 mL Per Tube Daily   free water  200 mL Per Tube Q8H   heparin injection (subcutaneous)  5,000 Units Subcutaneous Q8H   hydrALAZINE  10 mg Per Tube Q8H   insulin aspart  0-20 Units Subcutaneous Q4H   insulin aspart  6 Units Subcutaneous Q4H   insulin glargine-yfgn  20 Units Subcutaneous BID   metoprolol tartrate  25 mg Per Tube BID   mouth rinse  15 mL Mouth Rinse Q2H   polyethylene glycol  17 g Per Tube BID   sennosides  10 mL Per Tube BID   sodium chloride flush  10-40 mL Intracatheter Q12H    Continuous Infusions:  sodium chloride Stopped (08/04/22 0514)   feeding supplement (VITAL 1.5 CAL) 55 mL/hr at 08/05/22 0800   fentaNYL infusion INTRAVENOUS 150 mcg/hr (08/05/22 0800)    PRN Meds: acetaminophen, dextrose, fentaNYL, fentaNYL (SUBLIMAZE) injection, hydrALAZINE, ibuprofen, labetalol, midazolam, mouth rinse, polyethylene glycol, sodium chloride flush  Physical Exam Constitutional:      General: He is not in acute distress.    Appearance: He is ill-appearing.     Comments: Remains on ventilator  Musculoskeletal:     Right lower leg: No edema.      Left lower leg: No edema.  Skin:    General: Skin is warm and dry.            Vital Signs: BP 129/60   Pulse 92   Temp (!) 101.5 F (38.6 C)   Resp 14   Ht 6' (1.829 m)   Wt 84.6 kg   SpO2 96%   BMI 25.30 kg/m  SpO2: SpO2: 96 % O2 Device: O2 Device: Ventilator O2 Flow Rate: O2 Flow Rate (L/min): 40 L/min  Intake/output summary:  Intake/Output Summary (Last 24 hours) at 08/05/2022 1258 Last data filed at 08/05/2022 1000 Gross per 24 hour  Intake 2061.17 ml  Output 1900 ml  Net 161.17 ml    LBM: Last BM Date : 08/03/22 Baseline Weight: Weight: 87.8 kg Most recent weight: Weight: 84.6 kg  Patient Active Problem List   Diagnosis Date Noted   Cardiac arrest ([place]) 08/02/2022   Hypokalemia 07/10/2022   Acute respiratory failure with hypoxia ([place]) 07/11/2022   Malignant neoplasm of prostate ([place]) 04/05/2021   Diabetes mellitus without complication ([place]) [id number]   Visit for preventive health examination 11/22/2012   Statin intolerance 11/20/2012   Does not have health insurance 09/03/2011   Preventive measure 06/21/2010   POSTNASAL DRIP SYNDROME 01/21/2007  DIABETES MELLITUS, TYPE II 12/04/2006   HYPERLIPIDEMIA 12/04/2006   HYPERTENSION 12/04/2006   DISEASE, HYPERTENSIVE HEART, BENIGN, W/HF 12/04/2006   Personal history of venous thrombosis and embolism 12/04/2006    Palliative Care Assessment & Plan   HPI: 62 y.o. male  with past medical history of previous DVT/PE in 2005 not currently on coumadin, CHF, HLD, prostate cancer, SVT, T2DM presents to [facility] ED on 3/23 cardiac arrest.  He was admitted on 07/10/2022 with cardiogenic shock in the setting of cardiac arrest, VT arrest in the setting of hyperkalemia, history of HFpEF, acute hypoxic respiratory failure from aspiration pneumonitis and acute pulmonary edema, acute PTA, ICH stroke, concern for anoxic injury, AKI secondary to shock and DKA, and others.    PMT was consulted for [place]  conversations.  Assessment: Extensive multidisciplinary family meeting on yesterday. Family agreed to DNR allowing time for further discussions and complex decision making.   Daughter is at bedside during my visit today. She shares family recently spoke with Dr. Loanne Drilling and plans have been made to pursue comfort focused care with compassionate extubation around 1-130 pm tomorrow. Daughter shares family is at peace with their decision knowing her father would not want to suffer and his quality of life would not be what he would want. Emotional support provided.   All questions answered and support provided. Family aware palliative is available for additional support as needed.    Recommendations/Plan: CODE STATUS changed to DNR per discussions on yesterday Per family plans for compassionate extubation/comfort focused care around 1:30pm tomorrow.  Palliative will continue to support and follow. Please secure chat or call team line with urgent needs.   Thank you for allowing the Palliative Medicine Team to assist in the care of this patient.  Visit consisted of counseling and education dealing with the complex and emotionally intense issues of symptom management and palliative care in the setting of serious and potentially life-threatening illness.Greater than 50%  of this time was spent counseling and coordinating care related to the above assessment and plan.  Alda Lea, AGPCNP-BC  Palliative Medicine Team/[place]  *Please note that this is a verbal dictation therefore any spelling or grammatical errors are due to the "[place] [employer]" system interpretation.

## 2022-08-05 NOTE — Progress Notes (Signed)
NAME:  Bobby Obrien, MRN:  [id number], DOB:  1961/04/01, LOS: 7 ADMISSION DATE:  07/21/2022, CONSULTATION DATE:  3/23 REFERRING MD:  Dr. Darl Householder, CHIEF COMPLAINT:  cardiac arrest   History of Present Illness:  Patient is a 62 year old male with pertinent PMH previous DVT/PE in 2005 not currently on coumadin, CHF, HLD, prostate cancer, SVT, T2DM presents to [facility] ED on 3/23 cardiac arrest.  On 3/23 patient was at a dinner doing well. Was dancing and got tired drinking a lot of water. Then had witnessed arrest around 87.  EMS arrived and started CPR.  Had intermittent ROSC.  Longest ROSC about 10 minutes.  EMS reports patient's rhythm would go into V. tach that requiring multiple shocks.  Per EMS EKG showing possible STEMI.  Patient given amio, mag, fluids and transported to [facility] ED.  Upon arrival to [facility] ED patient without pulses PEA arrest and CPR resumed.  After ROSC patient with sats 50s to 60s.  Thought to have issues with ETT position and patient reintubated. Patient had another PEA arrest while in ED and ROSC achieved. Patient arrested 5 different times with estimated total ROSC at least 30 minutes. Patient was coughing/gagging against ETT and was started on some sedation; not following commands. Hypotensive started on epi and levo. Bicarb drip started. CXR showing extensive b/l infiltrate vs. Pulm edema. EKG no ST elevation; prolonged qtc. K 2.8, mag 3.4, glucose 410, creat 1.77, calcium 8.5, AG 19, co2 13, wbc 12.4, troponin 563, bnp 77, ast 411, alt 478. Cards consulted. PCCM consulted for icu admission.  Pertinent  Medical History   Past Medical History:  Diagnosis Date   BPH associated with nocturia    History of asthma    childhood   History of CHF (congestive heart failure) 04/2004   in setting PE   History of pulmonary embolism 04/2004   with HF and hypertension;  bilateral lower lobe,  completed coumadin   (07-08-2021  pt stated never had clots before and none since, pt had negative  work-up for blood disorder at that time)   Hyperlipidemia    Hypertension    followed by pcp   (per cardiology note in epic 0302-2023 nuclear stress test 07-27-2004 normal  and echo 07-27-2005 (ef 50-55%)   Malignant neoplasm prostate ([place]) 01/2021   urologist--- dr wrenn/  radiation oncology-- dr Tammi Klippel;    dx 09/ 2022  Gleason 3+4, PSA 10.5   Panic disorder    Paroxysmal SVT (supraventricular tachycardia) ([place])    nonsustained;   ED visit 12-26-2020  SVT heart rate 2013 x2 doses adenosine;  followed up with pcp was referred to cardiology at the time but pt had no visit;  pt needed pcp clearance prior to surgery on 07-08-2021 and was referred to cardiology, office visit w/ cardiologist-- dr Stanton Kidney branch 07-07-2021, was cleared for surgery, pt to started taking toprol   Tachycardia    Type 2 diabetes mellitus ([place])    followed by pcp   (03-03-2023n pt stated checks blood sugar once daily , fasting (130s) and not fasting (165-170)   Wears partial dentures    upper    Significant Hospital Events: Including procedures, antibiotic start and stop dates in addition to other pertinent events   3/23 vtach arrest; ROSC about 30 minutes?; pccm consulted  3/26 Remains poorly responsive. MRI shows large R PICA stroke along with a L ICA stroke. Neuro consulted. Started on hypertonic saline 3/27 Hypertonic discontinued 3/28 EEG with no seizures. Possible anoxic  brain injury vs toxic-metabolic  [id number] Palliative meeting - DNR  Interim History / Subjective:  Palliative meeting yesterday. Patient DNR Febrile overnight. Cultures neg, normal WBC. Suspected neuro etiology Minimal vent settings Minimally responsive  Objective   Blood pressure (!) 156/70, pulse (!) 103, temperature (!) 102.2 F (39 C), resp. rate (!) 26, height 6' 2022/09/06 m), weight 84.6 kg, SpO2 96 %. CVP:  [9 mmHg-14 mmHg] 11 mmHg  Vent Mode: PRVC FiO2 (%):  [40 %] 40 % Set Rate:  [24 bmp] 24 bmp Vt Set:  [460 mL] 460 mL PEEP:  [5  cmH20] 5 cmH20 Plateau Pressure:  [15 cmH20-21 cmH20] 21 cmH20   Intake/Output Summary (Last 24 hours) at 08/05/2022 0842 Last data filed at 08/05/2022 0400 Gross per 24 hour  Intake 2568.75 ml  Output 2100 ml  Net 468.75 ml   Filed Weights   08/02/22 1415 08/03/22 0415 08/04/22 0500  Weight: 94.4 kg 91.4 kg 84.6 kg   Physical Exam: General: Critically ill-appearing, no acute distress HENT: [place], AT, ETT in place Eyes: EOMI, no scleral icterus Respiratory: Clear to auscultation bilaterally.  No crackles, wheezing or rales Cardiovascular: RRR, -M/R/G, no JVD GI: BS+, soft, nontender Extremities:-Edema,-tenderness Neuro: Eyes open, no tracking, does not follow commands, +cough/gag/corneal reflex  Na 155 BUN/Cr 42/1.73 AST and ALT improving 63, 57 respectfully WBC normalized  Resolved Hospital Problem list    Anion gap metabolic acidosis with lactic acidosis.  Assessment & Plan:  Cardiogenic shock in setting of cardiac arrest. VT arrest in setting of hypokalemia. Hx of HFpEF, SVT. HTN Echo reviewed with EF 0000000, grade 1 diastolic dysfunction.   Seen by Cards 3/25. May need cardiac ischemic evaluation if his neurostatus improves. Continue metoprolol 25 mg BID  Acute hypoxic respiratory failure from aspiration pneumonitis and acute pulmonary edema. S/p Unasyn x 5d ended 3/28 Full vent support LTVV, 4-8cc/kg IBW with goal Pplat<30 and DP<15 Goal plateau pressure less than 30 Follow intermittent chest x-ray  Acute PCA, ICA stoke with edema in right cerebellar and left front lobe with brain compression on 4th ventricle Acute toxic metabolic encephalopathy 2nd to hypoxia, DKA. Concern for anoxic injury Neuro checks Appreciate Neuro input Goal Na 150-155. At goal Off hypertonic.  Reduce FWF to q8h  Fevers likely related to neuro etiology Completed Unasyn Monitor fever/WBC. Normalized WBC. Cultures negative PRN antipyretics Cooling blanket Repeat respiratory  culture  Iatrogenic hypernatremia - improving FWF Na q6h  AKI from ATN 2nd to shock, and DKA. - good UOP, worsening Cr Monitor UOP/Cr f/u BMET, electrolyes  Elevated LFTs - improving Maybe from shock liver. Monitor labs  DM2 DKA - resolved Continue Semglee, SSI, tube feed coverage  Constipation Bowel regimen. Miralax to bid  HTN Continue Norvasc, hydralazine as needed  Goals of care Prognosis for meaningful recovery is poor.  Palliative care following. Family wishes to remain aggressive but agreed to DNR 3/29 Follow-up meeting scheduled for 3/29.  Best Practice (right click and "Reselect all SmartList Selections" daily)   Diet/type: tubefeeds DVT prophylaxis: prophylactic heparin  GI prophylaxis: H2B Lines: Central line and Arterial Line Foley:  Yes, and it is still needed Code Status:  DNR Last date of multidisciplinary goals of care discussion [3/29]  Critical care time:    The patient is critically ill with multiple organ systems failure and requires high complexity decision making for assessment and support, frequent evaluation and titration of therapies, application of advanced monitoring technologies and extensive interpretation of multiple databases.  [place]  Time: 39 Minutes.   [name], M.D. [facility] Pulmonary/Critical Care Medicine 08/05/2022 8:42 AM   Please see Amion for pager number to reach on-call Pulmonary and Critical Care Team.

## 2022-08-05 NOTE — Plan of Care (Addendum)
Patient unresponsive/not interactive. Family considering comfort care status. Patient's daughter at bedside throughout the night requested not to turn and reposition  patient after speaking to the rest of the family due to his having a neuro storm episode following repositioning. She says they plan to withdraw/go comfort care. No aggressive repositioning done for the rest of the night.

## 2022-08-05 NOTE — IPAL (Signed)
  Interdisciplinary Goals of Care Family Meeting   Date carried out: 08/05/2022  Location of the meeting: Phone conference  Member's involved: Physician and Family Member or next of kin  Durable Power of Attorney or acting medical decision maker: Bobby Obrien - patient's wife    Discussion: We discussed goals of care for Bobby Obrien .  After discussing with family would like to move forward with withdrawal of care around 1-1:30 PM tomorrow when remaining family can be present. We also discussed discontinuing unnecessary meds until withdrawal tomorrow. Wife agreed.  Code status:   Code Status: DNR   Disposition: Continue current acute care until withdrawal of care tomorrow. No escalation.  Time spent for the meeting: 15 min    Yaffa Seckman [name], MD  08/05/2022, 12:01 PM

## 2022-08-06 LAB — GLUCOSE, CAPILLARY
Glucose-Capillary: 262 mg/dL — ABNORMAL HIGH (ref 70–99)
Glucose-Capillary: 285 mg/dL — ABNORMAL HIGH (ref 70–99)
Glucose-Capillary: 241 mg/dL — ABNORMAL HIGH (ref 70–99)
Glucose-Capillary: 308 mg/dL — ABNORMAL HIGH (ref 70–99)

## 2022-08-06 MED ORDER — MORPHINE 100MG IN NS 100ML (1MG/ML) PREMIX INFUSION
0.0000 mg/h | INTRAVENOUS | Status: DC
  Administered 2022-08-06 (×2): 15 mg/h via INTRAVENOUS
  Filled 2022-08-06 (×2): qty 100

## 2022-08-06 MED ORDER — SODIUM CHLORIDE 0.9 % IV SOLN
25.0000 mg | Freq: Four times a day (QID) | INTRAVENOUS | Status: DC | PRN
  Administered 2022-08-06: 25 mg via INTRAVENOUS
  Filled 2022-08-06: qty 1

## 2022-08-06 MED ORDER — MIDAZOLAM HCL 2 MG/2ML IJ SOLN
2.0000 mg | INTRAMUSCULAR | Status: DC | PRN
  Administered 2022-08-06: 2 mg via INTRAVENOUS
  Filled 2022-08-06: qty 4

## 2022-08-06 MED ORDER — LABETALOL HCL 5 MG/ML IV SOLN
20.0000 mg | Freq: Once | INTRAVENOUS | Status: AC
  Administered 2022-08-06: 20 mg via INTRAVENOUS
  Filled 2022-08-06: qty 4

## 2022-08-06 MED ORDER — MORPHINE BOLUS VIA INFUSION: 5.0000 mg | INTRAVENOUS | Status: DC | PRN

## 2022-08-06 MED ORDER — POLYVINYL ALCOHOL 1.4 % OP SOLN: 1.0000 [drp] | Freq: Four times a day (QID) | OPHTHALMIC | Status: DC | PRN

## 2022-08-06 MED ORDER — GLYCOPYRROLATE 0.2 MG/ML IJ SOLN: 0.2000 mg | INTRAMUSCULAR | Status: DC | PRN

## 2022-08-06 MED ORDER — ACETAMINOPHEN 325 MG PO TABS: 650.0000 mg | ORAL_TABLET | Freq: Four times a day (QID) | ORAL | Status: DC | PRN

## 2022-08-06 MED ORDER — ACETAMINOPHEN 650 MG RE SUPP: 650.0000 mg | Freq: Four times a day (QID) | RECTAL | Status: DC | PRN

## 2022-08-06 MED ORDER — HALOPERIDOL LACTATE 5 MG/ML IJ SOLN: 2.5000 mg | INTRAMUSCULAR | Status: DC | PRN

## 2022-08-06 MED ORDER — STERILE WATER FOR INJECTION IJ SOLN
INTRAMUSCULAR | Status: AC
  Filled 2022-08-06: qty 10

## 2022-08-06 MED ORDER — GLYCOPYRROLATE 0.2 MG/ML IJ SOLN
0.2000 mg | INTRAMUSCULAR | Status: DC | PRN
  Administered 2022-08-06: 0.2 mg via INTRAVENOUS
  Filled 2022-08-06 (×2): qty 1

## 2022-08-06 MED ORDER — GLYCOPYRROLATE 1 MG PO TABS: 1.0000 mg | ORAL_TABLET | ORAL | Status: DC | PRN

## 2022-08-06 MED ORDER — SODIUM CHLORIDE 0.9 % IV SOLN: INTRAVENOUS | Status: DC

## 2022-08-06 NOTE — Progress Notes (Signed)
NAME:  Bobby Obrien, MRN:  [id number], DOB:  26-Aug-1960, LOS: 8 ADMISSION DATE:  08/03/2022, CONSULTATION DATE:  3/23 REFERRING MD:  Dr. Darl Householder, CHIEF COMPLAINT:  cardiac arrest   History of Present Illness:  Patient is a 62 year old male with pertinent PMH previous DVT/PE in 2005 not currently on coumadin, CHF, HLD, prostate cancer, SVT, T2DM presents to [facility] ED on 3/23 cardiac arrest.  On 3/23 patient was at a dinner doing well. Was dancing and got tired drinking a lot of water. Then had witnessed arrest around 85.  EMS arrived and started CPR.  Had intermittent ROSC.  Longest ROSC about 10 minutes.  EMS reports patient's rhythm would go into V. tach that requiring multiple shocks.  Per EMS EKG showing possible STEMI.  Patient given amio, mag, fluids and transported to [facility] ED.  Upon arrival to [facility] ED patient without pulses PEA arrest and CPR resumed.  After ROSC patient with sats 50s to 60s.  Thought to have issues with ETT position and patient reintubated. Patient had another PEA arrest while in ED and ROSC achieved. Patient arrested 5 different times with estimated total ROSC at least 30 minutes. Patient was coughing/gagging against ETT and was started on some sedation; not following commands. Hypotensive started on epi and levo. Bicarb drip started. CXR showing extensive b/l infiltrate vs. Pulm edema. EKG no ST elevation; prolonged qtc. K 2.8, mag 3.4, glucose 410, creat 1.77, calcium 8.5, AG 19, co2 13, wbc 12.4, troponin 563, bnp 77, ast 411, alt 478. Cards consulted. PCCM consulted for icu admission.  Pertinent  Medical History   Past Medical History:  Diagnosis Date   BPH associated with nocturia    History of asthma    childhood   History of CHF (congestive heart failure) 04/2004   in setting PE   History of pulmonary embolism 04/2004   with HF and hypertension;  bilateral lower lobe,  completed coumadin   (07-08-2021  pt stated never had clots before and none since, pt had negative  work-up for blood disorder at that time)   Hyperlipidemia    Hypertension    followed by pcp   (per cardiology note in epic 0302-2023 nuclear stress test 07-27-2004 normal  and echo 07-27-2005 (ef 50-55%)   Malignant neoplasm prostate ([place]) 01/2021   urologist--- dr wrenn/  radiation oncology-- dr Tammi Klippel;    dx 09/ 2022  Gleason 3+4, PSA 10.5   Panic disorder    Paroxysmal SVT (supraventricular tachycardia) ([place])    nonsustained;   ED visit 12-26-2020  SVT heart rate 2013 x2 doses adenosine;  followed up with pcp was referred to cardiology at the time but pt had no visit;  pt needed pcp clearance prior to surgery on 07-08-2021 and was referred to cardiology, office visit w/ cardiologist-- dr Stanton Kidney branch 07-07-2021, was cleared for surgery, pt to started taking toprol   Tachycardia    Type 2 diabetes mellitus ([place])    followed by pcp   (03-03-2023n pt stated checks blood sugar once daily , fasting (130s) and not fasting (165-170)   Wears partial dentures    upper    Significant Hospital Events: Including procedures, antibiotic start and stop dates in addition to other pertinent events   3/23 vtach arrest; ROSC about 30 minutes?; pccm consulted  3/26 Remains poorly responsive. MRI shows large R PICA stroke along with a L ICA stroke. Neuro consulted. Started on hypertonic saline 3/27 Hypertonic discontinued 3/28 EEG with no seizures. Possible anoxic  brain injury vs toxic-metabolic  [id number] Palliative meeting - DNR  Interim History / Subjective:  Febrile with Tmax 101.8 in last 24 hours. Intermittently hypertensive Remains unresponsive Per wife plan for withdrawal of care 1-1:30 PM today  Objective   Blood pressure (!) 182/74, pulse (!) 103, temperature 99 F (37.2 C), temperature source Esophageal, resp. rate (!) 29, height 6' 09-05-22 m), weight 92.5 kg, SpO2 100 %. CVP:  [9 mmHg-13 mmHg] 13 mmHg  Vent Mode: PRVC FiO2 (%):  [40 %] 40 % Set Rate:  [18 bmp-24 bmp] 18 bmp Vt Set:  [460  mL-600 mL] 600 mL PEEP:  [5 cmH20] 5 cmH20 Pressure Support:  [10 cmH20] 10 cmH20 Plateau Pressure:  [16 cmH20-17 cmH20] 16 cmH20   Intake/Output Summary (Last 24 hours) at  1210 Last data filed at  1200 Gross per 24 hour  Intake 2110.54 ml  Output 2900 ml  Net -789.46 ml   Filed Weights   08/03/22 0415 08/04/22 0500  0334  Weight: 91.4 kg 84.6 kg 92.5 kg   Physical Exam: General: Critically ill-appearing, no acute distress HENT: [place], AT, ETT in place Eyes: EOMI, no scleral icterus Respiratory: Clear to auscultation bilaterally.  No crackles, wheezing or rales Cardiovascular: RRR, -M/R/G, no JVD GI: BS+, soft, nontender Extremities:-Edema,-tenderness Neuro: Eyes open, no tracking, does not follow commands. +cough/gag/corneal reflex  No labs in the last 24 hours Resolved Hospital Problem list    Anion gap metabolic acidosis with lactic acidosis.  Assessment & Plan:  Cardiogenic shock in setting of cardiac arrest. Echo reviewed with EF 0000000, grade 1 diastolic dysfunction.   VT arrest in setting of hypokalemia. Hx of HFpEF, SVT. HTN Acute hypoxic respiratory failure from aspiration pneumonitis and acute pulmonary edema.S/p Unasyn x 5d ended 3/28 Acute PCA, ICA stoke with edema in right cerebellar and left front lobe with brain compression on 4th ventricle Acute toxic metabolic encephalopathy 2nd to hypoxia, DKA. Concern for anoxic injury Fevers likely related to neuro etiology Iatrogenic hypernatremia  AKI from ATN 2nd to shock, and DKA. - good UOP, worsening Cr Elevated LFTs DM2 DKA  Constipation HTN  Full vent support with plan for extubation once comfort measures started Palliative care following.    Best Practice (right click and "Reselect all SmartList Selections" daily)   Diet/type: tubefeeds DVT prophylaxis: prophylactic heparin  GI prophylaxis: H2B Lines: Central line and Arterial Line Foley:  Yes, and it is still needed Code  Status:  DNR Last date of multidisciplinary goals of care discussion [3/29]  Critical care time:    The patient is critically ill with multiple organ systems failure and requires high complexity decision making for assessment and support, frequent evaluation and titration of therapies, application of advanced monitoring technologies and extensive interpretation of multiple databases.  Independent Critical Care Time: 35 Minutes.   [name], M.D. [facility] Pulmonary/Critical Care Medicine  12:10 PM   Please see Amion for pager number to reach on-call Pulmonary and Critical Care Team.

## 2022-08-06 NOTE — IPAL (Signed)
  Interdisciplinary Goals of Care Family Meeting   Date carried out:   Location of the meeting: Bedside  Member's involved: Physician, Bedside Registered Nurse, and Family Member or next of kin  Durable Power of Attorney or acting medical decision maker: Colin Behrle    Discussion: We discussed goals of care for [employer] .  Confirmed plan for withdrawal of care and comfort measures. Discussed that anticipated time of death after withdrawal of life support can vary. If he is still present tomorrow morning, will consider transfer to floor bed to accommodate family.  Code status:   Code Status: DNR   Disposition: In-patient comfort care  Time spent for the meeting: 10 min    Mayline Dragon [name], MD  , 1:04 PM

## 2022-08-06 NOTE — Progress Notes (Signed)
Patient was compassionately extubated at 1516 per MD order/family wishes. Family was present at bedside during extubation.

## 2022-08-06 NOTE — Progress Notes (Signed)
Patient passed at 23:19; verified by Weldon Inches, RN and Yolande Jolly, RN.   Bobby Obrien

## 2022-08-07 DIAGNOSIS — K7200 Acute and subacute hepatic failure without coma: Secondary | ICD-10-CM | POA: Insufficient documentation

## 2022-08-07 DIAGNOSIS — R509 Fever, unspecified: Secondary | ICD-10-CM | POA: Insufficient documentation

## 2022-08-07 DIAGNOSIS — G9340 Encephalopathy, unspecified: Secondary | ICD-10-CM | POA: Insufficient documentation

## 2022-08-07 DIAGNOSIS — I63531 Cerebral infarction due to unspecified occlusion or stenosis of right posterior cerebral artery: Secondary | ICD-10-CM | POA: Insufficient documentation

## 2022-08-07 DIAGNOSIS — E1110 Type 2 diabetes mellitus with ketoacidosis without coma: Secondary | ICD-10-CM | POA: Insufficient documentation

## 2022-08-07 DIAGNOSIS — I472 Ventricular tachycardia, unspecified: Secondary | ICD-10-CM | POA: Insufficient documentation

## 2022-08-07 DIAGNOSIS — N179 Acute kidney failure, unspecified: Secondary | ICD-10-CM | POA: Insufficient documentation

## 2022-08-07 DIAGNOSIS — R570 Cardiogenic shock: Secondary | ICD-10-CM | POA: Insufficient documentation

## 2022-08-07 DEATH — deceased

## 2022-08-08 LAB — CULTURE, RESPIRATORY W GRAM STAIN: Special Requests: NORMAL

## 2022-09-06 NOTE — Death Summary Note (Signed)
DEATH SUMMARY   Patient Details  Name: Bobby Obrien MRN: [id number] DOB: 11-19-1960  Admission/Discharge Information   Admit Date:  2022-08-09  Date of Death: Date of Death: 08-17-2022  Time of Death: Time of Death: 2317/08/25  Length of Stay: 08-26-2022  Referring Physician: Burnis Medin, MD   Reason(s) for Hospitalization  Cardiac arrest  Diagnoses  Preliminary cause of death:  Secondary Diagnoses (including complications and co-morbidities):  Principal Problem:   Cardiac arrest Active Problems:   Hypokalemia   Acute respiratory failure with hypoxia   Ventricular tachycardia   Acute right PCA stroke   Acute encephalopathy   Fever   AKI (acute kidney injury)   Ischemic hepatitis   DKA (diabetic ketoacidosis)   Cardiogenic shock  Brief Hospital Course (including significant findings, care, treatment, and services provided and events leading to death)  Bobby Obrien is a 62 y.o. year old male with history of DVT/PE in 08/26/03 not currently on coumadin, CHF, HLD, prostate cancer, SVT, T2DM admitted on Aug 09, 2022 OOH cardiac arrest. Reported VT requiring shocks and had intermittent ROSC with longest duration 10 min. On arrival to ED had PEA and required CPR x 5. After ROSC. Admitted to ICS post-arrest. Work-up demonstrated large right PICA strike and left ICA stroke. Palliative care involved and after discussion, decision to withdraw care on 08/17/2022.    Pertinent Labs and Studies  Significant Diagnostic Studies EEG adult  Result Date: 08/03/2022 Lora Havens, MD     08/03/2022  4:35 PM Patient Name: Bobby Obrien MRN: [id number] Epilepsy Attending: Lora Havens Referring Physician/Provider: Lacinda Axon, MD Date: 08/03/2022 Duration: 22.34 mins Patient history: 62yo m with ams. EEG to evaluate for seizure Level of alertness:  lethargic AEDs during EEG study: None Technical aspects: This EEG study was done with scalp electrodes positioned according to the 10-20 International system of  electrode placement. Electrical activity was reviewed with band pass filter of 1-70Hz , sensitivity of 7 uV/mm, display speed of 7mm/sec with a 60Hz  notched filter applied as appropriate. EEG data were recorded continuously and digitally stored.  Video monitoring was available and reviewed as appropriate. Description:EEG showed continuous generalized and lateralized left hemisphere 3 to 6 Hz theta-delta slowing. Qasi- periodic discharges, at times with triphasic morphology were noted in right hemisphere with fluctuating frequency of 1-2hz . Hyperventilation and photic stimulation were not performed.   ABNORMALITY - Continuous slow, generalized and lateralized left hemisphere - Periodic discharges, right hemisphere IMPRESSION: This study is suggestive of cortical dysfunction arising from left hemisphere likely secondary to underlying structural abnormality. Additionally there are periodic discharges in right hemisphere with fluctuating frequency. The morphology and fluctuating frequency is more commonly indicative of anoxic -hypoxic brain injury in patients with cardiac arrest. Can also be seen due to toxic-metabolic causes like  cefepime toxicity. Lastly there is moderate to severe diffuse encephalopathy, non specific etiology. Dr. Leonie Man was notified. Priyanka O Yadav   VAS [place] TRANSCRANIAL DOPPLER  Result Date: 08/03/2022  Transcranial Doppler Patient Name:  Bobby Obrien [facility]  Date of Exam:   08/02/2022 Medical Rec #: [id number]       Accession #:    [id number] Date of Birth: March 14, 1961        Patient Gender: M Patient Age:   73 years Exam Location:  [facility] Procedure:      VAS [place] TRANSCRANIAL DOPPLER Referring Phys: PRAMOD SETHI --------------------------------------------------------------------------------  Indications: Stroke. Performing Technologist: Rogelia Rohrer RVT, RDMS  Examination Guidelines: A complete evaluation includes  B-mode imaging, spectral Doppler, color Doppler, and power Doppler as needed of  all accessible portions of each vessel. Bilateral testing is considered an integral part of a complete examination. Limited examinations for reoccurring indications may be performed as noted.  +----------+---------------+----------+-----------+-------+ RIGHT TCD Right VM (cm/s)Depth (cm)PulsatilityComment +----------+---------------+----------+-----------+-------+ MCA            77.00                  0.93            +----------+---------------+----------+-----------+-------+ ACA           -63.00                  0.57            +----------+---------------+----------+-----------+-------+ Term ICA       62.00                  1.02            +----------+---------------+----------+-----------+-------+ PCA P1         33.00                  0.77            +----------+---------------+----------+-----------+-------+ Opthalmic      15.00                  1.30            +----------+---------------+----------+-----------+-------+ ICA siphon     74.00                  0.91            +----------+---------------+----------+-----------+-------+ Vertebral     -51.00                  0.84            +----------+---------------+----------+-----------+-------+ Distal ICA     26.00                  0.98            +----------+---------------+----------+-----------+-------+  +----------+--------------+----------+-----------+-------+ LEFT TCD  Left VM (cm/s)Depth (cm)PulsatilityComment +----------+--------------+----------+-----------+-------+ MCA           77.00                  0.54            +----------+--------------+----------+-----------+-------+ ACA           -66.00                 0.67            +----------+--------------+----------+-----------+-------+ Term ICA      134.00       6.3       0.62            +----------+--------------+----------+-----------+-------+ PCA P1        38.00                  1.08             +----------+--------------+----------+-----------+-------+ Opthalmic     16.00                  1.61            +----------+--------------+----------+-----------+-------+ ICA siphon    33.00                  0.73            +----------+--------------+----------+-----------+-------+  Vertebral     -46.00                 0.92            +----------+--------------+----------+-----------+-------+ Distal ICA    27.00                  0.67            +----------+--------------+----------+-----------+-------+  +------------+-------+-------+             VM cm/sComment +------------+-------+-------+ Prox Basilar-66.00  0.95   +------------+-------+-------+ Dist Basilar-61.00  0.86   +------------+-------+-------+ +----------------------+----+ Right Lindegaard Ratio2.96 +----------------------+----+ +---------------------+----+ Left Lindegaard Ratio2.85 +---------------------+----+  Summary:  Elevated terminal left ICA mean flow velocities sugegst mild stenosis.Normal mean flow velocities in majority of identified vessels of anterior and posterior cerebral circulations. *See table(s) above for TCD measurements and observations.  Diagnosing physician: Antony Contras MD Electronically signed by Antony Contras MD on 08/03/2022 at 12:42:49 PM.    Final    VAS US CAROTID  Result Date: 08/03/2022 Carotid Arterial Duplex Study Patient Name:  Bobby Obrien [facility]  Date of Exam:   08/02/2022 Medical Rec #: [id number]       Accession #:    [id number] Date of Birth: February 11, 1961        Patient Gender: M Patient Age:   4 years Exam Location:  [facility] Procedure:      VAS US CAROTID Referring Phys: PRAMOD SETHI --------------------------------------------------------------------------------  Indications:   CVA. Risk Factors:  Hyperlipidemia, Diabetes, no history of smoking. Other Factors: CHF. Limitations    Today's exam was limited due to a central line (left side). Performing Technologist: Rogelia Rohrer RVT, RDMS  Examination Guidelines: A complete evaluation includes B-mode imaging, spectral Doppler, color Doppler, and power Doppler as needed of all accessible portions of each vessel. Bilateral testing is considered an integral part of a complete examination. Limited examinations for reoccurring indications may be performed as noted.  Right Carotid Findings: +----------+--------+--------+--------+---------------------+------------------+           PSV cm/sEDV cm/sStenosisPlaque Description   Comments           +----------+--------+--------+--------+---------------------+------------------+ CCA Prox  94      17                                   intimal thickening +----------+--------+--------+--------+---------------------+------------------+ CCA Distal98      21              hypoechoic and       intimal thickening                                   heterogenous                            +----------+--------+--------+--------+---------------------+------------------+ ICA Prox  72      19                                                      +----------+--------+--------+--------+---------------------+------------------+ ICA Distal56      16                                                      +----------+--------+--------+--------+---------------------+------------------+  ECA       52      0                                                       +----------+--------+--------+--------+---------------------+------------------+ +----------+--------+-------+----------------+-------------------+           PSV cm/sEDV cmsDescribe        Arm Pressure (mmHG) +----------+--------+-------+----------------+-------------------+ [id number]             Multiphasic, WNL                    +----------+--------+-------+----------------+-------------------+ +---------+--------+--+--------+-+---------+ VertebralPSV cm/s35EDV cm/s9Antegrade  +---------+--------+--+--------+-+---------+  Left Carotid Findings: +----------+--------+--------+--------+------------------+------------------+           PSV cm/sEDV cm/sStenosisPlaque DescriptionComments           +----------+--------+--------+--------+------------------+------------------+ CCA Prox  95      10                                                   +----------+--------+--------+--------+------------------+------------------+ CCA Distal112     13                                intimal thickening +----------+--------+--------+--------+------------------+------------------+ ICA Prox  37      14                                                   +----------+--------+--------+--------+------------------+------------------+ ICA Distal42      18                                                   +----------+--------+--------+--------+------------------+------------------+ ECA       109                                                          +----------+--------+--------+--------+------------------+------------------+ +----------+--------+--------+----------------+-------------------+           PSV cm/sEDV cm/sDescribe        Arm Pressure (mmHG) +----------+--------+--------+----------------+-------------------+ [id number]             Multiphasic, WNL                    +----------+--------+--------+----------------+-------------------+ +---------+--------+--+--------+--+---------+ VertebralPSV cm/s82EDV cm/s34Antegrade +---------+--------+--+--------+--+---------+ ICA / ECA not well visualized due to central line placement/bandage  Summary: Right Carotid: The extracranial vessels were near-normal with only minimal wall                thickening or plaque. Left Carotid: The extracranial vessels were near-normal with only minimal wall               thickening or plaque. Vertebrals:  Bilateral vertebral arteries demonstrate antegrade flow.  Subclavians: Normal flow  hemodynamics were seen in bilateral subclavian              arteries. *See table(s) above for measurements and observations.  Electronically signed by Antony Contras MD on 08/03/2022 at 12:39:18 PM.    Final    DG Chest Port 1 View  Result Date: 08/02/2022 CLINICAL DATA:  Acute respiratory failure. EXAM: PORTABLE CHEST 1 VIEW COMPARISON:  Chest radiograph 08/01/2022 FINDINGS: An endotracheal tube remains in place and terminates at the level of the clavicular heads. A left jugular catheter terminates over the mid to lower SVC, unchanged. An enteric tube courses into the abdomen. There is improved aeration of the right mid and upper lung. Bibasilar airspace opacities are unchanged. Small pleural effusions are not excluded. No pneumothorax is identified. IMPRESSION: Improved right mid and upper lung aeration. Unchanged bibasilar airspace opacities which may reflect pneumonia, residual edema, and/or atelectasis. Electronically Signed   By: Logan Bores M.D.   On: 08/02/2022 08:44   CT HEAD WO CONTRAST (5MM)  Result Date: 08/01/2022 CLINICAL DATA:  Acute neurologic deficit EXAM: CT HEAD WITHOUT CONTRAST TECHNIQUE: Contiguous axial images were obtained from the base of the skull through the vertex without intravenous contrast. RADIATION DOSE REDUCTION: This exam was performed according to the departmental dose-optimization program which includes automated exposure control, adjustment of the mA and/or kV according to patient size and/or use of iterative reconstruction technique. COMPARISON:  None Available. FINDINGS: Brain: Cytotoxic edema in the right cerebellar hemisphere and left frontal lobe, likely early subacute infarcts. There is moderate mass effect on the fourth ventricle. No hydrocephalus. No acute hemorrhage. Vascular: No abnormal hyperdensity of the major intracranial arteries or dural venous sinuses. No intracranial atherosclerosis. Skull: The visualized skull base, calvarium and  extracranial soft tissues are normal. Sinuses/Orbits: Near complete opacification of the maxillary sinuses. The orbits are normal. IMPRESSION: 1. Cytotoxic edema in the right cerebellar hemisphere and left frontal lobe, likely early subacute infarcts. MRI would provide more detailed characterization. 2. Moderate mass effect on the fourth ventricle without hydrocephalus. These results will be called to the ordering clinician or representative by the Radiologist Assistant, and communication documented in the PACS or [employer]. Electronically Signed   By: Ulyses Jarred M.D.   On: 08/01/2022 20:59   MR BRAIN W WO CONTRAST  Result Date: 08/01/2022 CLINICAL DATA:  Anoxic brain injury EXAM: MRI HEAD WITHOUT AND WITH CONTRAST TECHNIQUE: Multiplanar, multiecho pulse sequences of the brain and surrounding structures were obtained without and with intravenous contrast. CONTRAST:  89mL GADAVIST GADOBUTROL 1 MMOL/ML IV SOLN COMPARISON:  Head CT from 3 days ago FINDINGS: Brain: Diffuse restricted diffusion throughout the left cerebral hemisphere with some sparing at the occipital lobe. Although seizure phenomenon is considered given the diffuse appearance, there is a vascular pattern with sparing of the posterior circulation. No bilateral cortical involvement for generalized seizure or global anoxic injury. Dense restricted diffusion inferior right cerebellum with possible mild petechial hemorrhage by precontrast T1 weighted imaging. There is fourth ventricular mass effect but no hydrocephalus. Vascular: Major flow voids and vascular enhancements are preserved. Skull and upper cervical spine: Normal marrow signal Sinuses/Orbits: Numerous retention cysts in the bilateral maxillary sinus. Negative orbits. Insert PRA call rib IMPRESSION: 1. Acute infarcts in the right PICA territory and left cerebral cortex respecting the left ICA distribution. 2. No hydrocephalus. Electronically Signed   By: Jorje Guild M.D.   On:  08/01/2022 18:58   DG Chest Port 1 View  Result Date: 08/01/2022 CLINICAL DATA:  Respiratory failure EXAM: PORTABLE CHEST 1 VIEW COMPARISON:  Previous studies including the examination of 07/31/2022 FINDINGS: Transverse diameter of heart is increased. Tip of endotracheal tube is 7.7 cm above the carina. NG tube is noted traversing the esophagus. Tip of left IJ central venous catheter is seen in the course of the superior vena cava. Central pulmonary vessels are prominent. There is increased density in right parahilar region and both lower lung fields with interval worsening. Lateral costophrenic angles are indistinct. There is no pneumothorax. IMPRESSION: There is increased density in right perihilar region and both lower lung fields suggesting possible asymmetric pulmonary edema or multifocal pneumonia. Electronically Signed   By: Elmer Picker M.D.   On: 08/01/2022 08:47   DG Chest Port 1 View  Result Date: 07/31/2022 CLINICAL DATA:  Respiratory failure EXAM: PORTABLE CHEST 1 VIEW COMPARISON:  CXR 07/30/22 FINDINGS: Endotracheal tube terminates approximately 7 cm above the carina, at the level of the thoracic inlet, similar to prior. Left-sided central venous catheter with the tip near the cavoatrial junction. Enteric tube courses below the diaphragm with the tip out of the field of view. No pleural effusion. No pneumothorax. Compared to prior exam there is slightly improved aeration of the bilateral lungs with a few scattered residual bilateral airspace opacities. Unchanged cardiac and mediastinal contours. No radiographically apparent displaced rib fractures. Visualized upper abdomen is unremarkable IMPRESSION: 1. Improved aeration of the bilateral lungs with a few scattered residual bilateral airspace opacities. 2. Endotracheal tube terminates approximately 7 cm above the carina, at the level of the thoracic inlet, similar to prior. Electronically Signed   By: Marin Roberts M.D.   On: 07/31/2022  07:15   ECHOCARDIOGRAM COMPLETE  Result Date: 07/30/2022    ECHOCARDIOGRAM REPORT   Patient Name:   Bobby Obrien [facility] Date of Exam: 07/30/2022 Medical Rec #:  [id number]      Height:       72.0 in Accession #:    [id number]     Weight:       193.6 lb Date of Birth:  1961/03/15       BSA:          2.101 m Patient Age:    5 years       BP:           116/73 mmHg Patient Gender: M              HR:           87 bpm. Exam Location:  Inpatient Procedure: 2D Echo, Cardiac Doppler, Color Doppler and Intracardiac            Opacification Agent STAT ECHO REPORT CONTAINS CRITICAL RESULT Indications:     I49.9* Cardiac arrhythmia, unspecified. Cardiac arrest  History:         Patient has prior history of Echocardiogram examinations, most                  recent 07/27/2005. Arrythmias:Cardiac Arrest; Risk                  Factors:Diabetes, Hypertension and Dyslipidemia. Pulmonary                  embolus. Cancer.  Sonographer:     Roseanna Rainbow RDCS Referring Phys:  [id number] Chrystie Nose PAYNE Diagnosing Phys: Jenkins Rouge MD  Sonographer Comments: Echo performed with patient supine and on artificial respirator. IMPRESSIONS  1. Unable to edit report Tech put gradients in erroneously patient has  no MS and peak/mean MV gradients are from MR velocity not diastolic mitral inflow.  2. No mural apical thrombus with definity. Left ventricular ejection fraction, by estimation, is 20 to 25%. The left ventricle has severely decreased function. The left ventricle demonstrates global hypokinesis. The left ventricular internal cavity size  was moderately dilated. Left ventricular diastolic parameters are consistent with Grade I diastolic dysfunction (impaired relaxation).  3. Right ventricular systolic function is normal. The right ventricular size is normal.  4. The mitral valve is abnormal. Mild mitral valve regurgitation. No evidence of mitral stenosis.  5. The aortic valve is tricuspid. Aortic valve regurgitation is not visualized. No aortic stenosis  is present.  6. The inferior vena cava is dilated in size with >50% respiratory variability, suggesting right atrial pressure of 8 mmHg. FINDINGS  Left Ventricle: No mural apical thrombus with definity. Left ventricular ejection fraction, by estimation, is 20 to 25%. The left ventricle has severely decreased function. The left ventricle demonstrates global hypokinesis. Definity contrast agent was given IV to delineate the left ventricular endocardial borders. The left ventricular internal cavity size was moderately dilated. There is no left ventricular hypertrophy. Left ventricular diastolic parameters are consistent with Grade I diastolic dysfunction (impaired relaxation). Right Ventricle: The right ventricular size is normal. No increase in right ventricular wall thickness. Right ventricular systolic function is normal. Left Atrium: Left atrial size was normal in size. Right Atrium: Right atrial size was normal in size. Pericardium: Trivial pericardial effusion is present. The pericardial effusion is posterior to the left ventricle. Mitral Valve: The mitral valve is abnormal. There is mild thickening of the mitral valve leaflet(s). Mild mitral valve regurgitation. No evidence of mitral valve stenosis. MV peak gradient, 70.2 mmHg. The mean mitral valve gradient is 42.0 mmHg. Tricuspid Valve: The tricuspid valve is normal in structure. Tricuspid valve regurgitation is not demonstrated. No evidence of tricuspid stenosis. Aortic Valve: The aortic valve is tricuspid. Aortic valve regurgitation is not visualized. No aortic stenosis is present. Pulmonic Valve: The pulmonic valve was normal in structure. Pulmonic valve regurgitation is not visualized. No evidence of pulmonic stenosis. Aorta: The aortic root is normal in size and structure. Venous: The inferior vena cava is dilated in size with greater than 50% respiratory variability, suggesting right atrial pressure of 8 mmHg. IAS/Shunts: No atrial level shunt detected  by color flow Doppler. Additional Comments: Unable to edit report Tech put gradients in erroneously patient has no MS and peak/mean MV gradients are from MR velocity not diastolic mitral inflow.  LEFT VENTRICLE PLAX 2D LVIDd:         6.50 cm      Diastology LVIDs:         6.00 cm      LV e' medial:    3.48 cm/s LV PW:         1.00 cm      LV E/e' medial:  12.9 LV IVS:        0.90 cm      LV e' lateral:   2.93 cm/s LVOT diam:     2.30 cm      LV E/e' lateral: 15.4 LV SV:         43 LV SV Index:   21 LVOT Area:     4.15 cm  LV Volumes (MOD) LV vol d, MOD A2C: 108.0 ml LV vol d, MOD A4C: 104.0 ml LV vol s, MOD A2C: 99.0 ml LV vol s, MOD A4C: 87.6 ml LV SV  MOD A2C:     9.0 ml LV SV MOD A4C:     104.0 ml LV SV MOD BP:      12.3 ml RIGHT VENTRICLE            IVC RV S prime:     8.55 cm/s  IVC diam: 2.40 cm TAPSE (M-mode): 1.1 cm LEFT ATRIUM             Index        RIGHT ATRIUM          Index LA diam:        2.40 cm 1.14 cm/m   RA Area:     9.96 cm LA Vol (A2C):   33.6 ml 15.99 ml/m  RA Volume:   22.60 ml 10.76 ml/m LA Vol (A4C):   14.6 ml 6.95 ml/m LA Biplane Vol: 23.8 ml 11.33 ml/m  AORTIC VALVE LVOT Vmax:   89.10 cm/s LVOT Vmean:  54.000 cm/s LVOT VTI:    0.104 m  AORTA Ao Root diam: 3.40 cm Ao Asc diam:  3.30 cm MITRAL VALVE MV Area (PHT): 3.75 cm    SHUNTS MV Area VTI:   0.42 cm    Systemic VTI:  0.10 m MV Peak grad:  70.2 mmHg   Systemic Diam: 2.30 cm MV Mean grad:  42.0 mmHg MV Vmax:       4.19 m/s MV Vmean:      297.0 cm/s MV Decel Time: 203 msec MV E velocity: 45.05 cm/s MV A velocity: 66.85 cm/s MV E/A ratio:  0.67 Jenkins Rouge MD Electronically signed by Jenkins Rouge MD Signature Date/Time: 07/30/2022/10:04:47 AM    Final (Updated)    EEG adult  Result Date: 07/30/2022 Lora Havens, MD     07/30/2022  8:38 AM Patient Name: Bobby Obrien MRN: [id number] Epilepsy Attending: Lora Havens Referring Physician/Provider: Mick Sell, PA-C Date: 07/30/2022 Duration: 26.06 mins Patient history: 62yo  M s/p cardiac arrest. EEG to evaluate for seizure Level of alertness: comatose AEDs during EEG study: None Technical aspects: This EEG study was done with scalp electrodes positioned according to the 10-20 International system of electrode placement. Electrical activity was reviewed with band pass filter of 1-70Hz , sensitivity of 7 uV/mm, display speed of 20mm/sec with a 60Hz  notched filter applied as appropriate. EEG data were recorded continuously and digitally stored.  Video monitoring was available and reviewed as appropriate. Description: EEG showed continuous generalized background suppression, not reactive to stimulation. Hyperventilation and photic stimulation were not performed.   ABNORMALITY -Background suppression, generalized IMPRESSION: This study is suggestive of profound diffuse encephalopathy, nonspecific etiology but with history of cardiac arrest, could be secondary to anoxic/hypoxic brain injury. No seizures or epileptiform discharges were seen throughout the recording. Lora Havens   DG CHEST PORT 1 VIEW  Result Date: 07/30/2022 CLINICAL DATA:  [id number].  Check central line. EXAM: PORTABLE CHEST 1 VIEW COMPARISON:  Portable chest yesterday at 8:57 p.m., CTA chest yesterday at 10:53 p.m. FINDINGS: 1:01 a.m. Interval ETT pullback with the tip in between the heads of the clavicles 7.3 cm from the carina. NGT again terminates at the mid body of stomach. Interval left IJ line insertion noted with the tip in the distal SVC. There is no pneumothorax. Overlying translucent pacing pads superimpose over the lower left chest. Minimal pleural effusions are unchanged. The heart is slightly enlarged, with central vascular fullness and diffuse airspace disease with upper zonal predominance and relative basolateral sparing, with subpleural  edema in the bases. The lung opacities are slightly less dense than previously but in all other respects there are no further changes. Stable mediastinum. IMPRESSION: 1.  Interval left IJ line insertion with the tip in the distal SVC. No pneumothorax. 2. Interval ETT pullback with the tip in between the heads of the clavicles 7.3 cm from the carina. 3. Slight improvement in diffuse airspace disease with upper zonal predominance. Stable minimal pleural effusions. Electronically Signed   By: Telford Nab M.D.   On: 07/30/2022 01:14   CT Angio Chest PE W and/or Wo Contrast  Result Date: 07/12/2022 CLINICAL DATA:  Pulmonary embolism suspected.  High probability. EXAM: CT ANGIOGRAPHY CHEST WITH CONTRAST TECHNIQUE: Multidetector CT imaging of the chest was performed using the standard protocol during bolus administration of intravenous contrast. Multiplanar CT image reconstructions and MIPs were obtained to evaluate the vascular anatomy. RADIATION DOSE REDUCTION: This exam was performed according to the departmental dose-optimization program which includes automated exposure control, adjustment of the mA and/or kV according to patient size and/or use of iterative reconstruction technique. CONTRAST:  71mL OMNIPAQUE IOHEXOL 350 MG/ML SOLN COMPARISON:  Portable chest today, portable chest 12/26/2020, and old CTA chest 04/21/2004. FINDINGS: Cardiovascular: The pulmonary trunk is upper limits of normal caliber. The main pulmonary arteries are within normal caliber limits. No arterial embolism is seen. There is mild cardiomegaly with slightly distended superior pulmonary veins, left chamber predominance and a small pericardial effusion collecting to the right. There is no right chamber enlargement, but there is IVC and hepatic vein reflux compatible with either right heart dysfunction or tricuspid regurgitation. There are trace calcifications left main, proximal LAD, proximal circumflex coronary arteries. Minimal aortic atherosclerosis. No aortic aneurysm is seen or evidence of dissection. The great vessels branch normally without significant plaques. There is trace intravenous air in the  anterior right shoulder area, the right axillary vein and SVC, likely either injected with IV placement or contrast injection. Mediastinum/Nodes: There is a mucoid septation in the trachea below the level of endotracheal intubation. Tip of the ETT there is 5.7 cm from carina. NGT is also in place and abuts the far wall of the mid body of stomach. No intrathoracic or axillary adenopathy is seen. The lower poles of the thyroid gland are unremarkable. No esophageal thickening is seen. Lungs/Pleura: There is extensive airspace consolidation with air bronchograms posteriorly, greater in the upper lobes than the lower lobes. More anteriorly there are innumerable irregular nodular opacities which are probably alveolar nodules, ranging in size from 5 mm up to 1.5 cm. This is seen on a background of diffuse interlobular septal thickening and ground-glass opacities in the upper greater than lower lobes, with dependent airspace disease, nodular interstitial changes to a lesser extent in the posterior right middle lobe. The central airways are clear. There is no pneumothorax. There are bilateral minimal layering pleural effusions. Upper Abdomen: Small volume perihepatic and perisplenic ascites. No other significant findings. Musculoskeletal: Bilateral subareolar dendritic gynecomastia noted left-greater-than-right with no other significant chest wall findings. No acute or significant osseous findings. The ribcage is intact. Review of the MIP images confirms the above findings. IMPRESSION: 1. No evidence of arterial embolus. 2. Cardiomegaly with slightly distended superior pulmonary veins, left chamber predominance. 3. IVC and hepatic vein reflux compatible with right heart dysfunction or tricuspid regurgitation. 4. Small pericardial effusion collecting to the right. 5. Extensive airspace consolidation posteriorly, background diffuse interstitial and ground-glass opacities elsewhere, and numerous irregular nodular opacities in  the more anterior  lungs which are probably nodular airspace disease. 6. The findings could all be due to pulmonary edema or could be a combination of edema and pneumonia. A follow-up study will be needed after treatment to ensure clearing particularly with regard to the nodular densities. 7. Minimal layering pleural effusions. 8. Small volume of perihepatic and perisplenic ascites. 9. Bilateral subareolar dendritic gynecomastia left-greater-than-right. 10. Aortic and coronary artery atherosclerosis. 11. ETT 5.7 cm from the carina. NGT abuts the far wall of the mid body of stomach. 12. Trace intravenous air in the right shoulder area, right axillary vein and SVC, likely either injected with IV placement or contrast injection. Electronically Signed   By: Telford Nab M.D.   On: 08/05/2022 23:32   CT Head Wo Contrast  Result Date: 07/20/2022 CLINICAL DATA:  cardiac arrest EXAM: CT HEAD WITHOUT CONTRAST TECHNIQUE: Contiguous axial images were obtained from the base of the skull through the vertex without intravenous contrast. RADIATION DOSE REDUCTION: This exam was performed according to the departmental dose-optimization program which includes automated exposure control, adjustment of the mA and/or kV according to patient size and/or use of iterative reconstruction technique. COMPARISON:  None Available. FINDINGS: Brain: No evidence of large-territorial acute infarction. No parenchymal hemorrhage. No mass lesion. No extra-axial collection. No mass effect or midline shift. No hydrocephalus. Basilar cisterns are patent. Vascular: No hyperdense vessel. Skull: No acute fracture or focal lesion. Sinuses/Orbits: Almost complete opacification of the maxillary sinuses bilaterally. Secretions within the nasopharynx. Partially visualized enteric and endotracheal tubes. Otherwise visualize paranasal sinuses and mastoid air cells are clear. The orbits are unremarkable. Other: None. IMPRESSION: 1. No acute intracranial  abnormality. 2. Maxillary sinus mucosal disease. Electronically Signed   By: Iven Finn M.D.   On: 07/16/2022 23:10   DG Chest Portable 2 Views  Result Date: 08/03/2022 CLINICAL DATA:  Intubated EXAM: CHEST  2 VIEW PORTABLE COMPARISON:  12/26/2020 FINDINGS: Extensive alveolar opacities predominantly in the upper lobes bilaterally consistent with pulmonary edema or diffuse bilateral pneumonia. Enlarged cardiac silhouette. No pneumothorax. Endotracheal tube tip is at the thoracic inlet. IMPRESSION: Extensive bilateral alveolar process with an upper lobe predominance most likely pulmonary edema. Electronically Signed   By: Sammie Bench M.D.   On: 07/28/2022 21:22    Microbiology Recent Results (from the past 240 hour(s))  Urine Culture (for pregnant, neutropenic or urologic patients or patients with an indwelling urinary catheter)     Status: None   Collection Time: 07/07/2022 12:40 AM   Specimen: Urine, Clean Catch  Result Value Ref Range Status   Specimen Description URINE, CLEAN CATCH  Final   Special Requests NONE  Final   Culture   Final    NO GROWTH Performed at [facility], [place] [address], [place], [place] [address]    Report Status 08/03/2022 FINAL  Final  Culture, Respiratory w Gram Stain     Status: None   Collection Time: 07/11/2022 10:51 PM   Specimen: Tracheal Aspirate; Respiratory  Result Value Ref Range Status   Specimen Description TRACHEAL ASPIRATE  Final   Special Requests NONE  Final   Gram Stain   Final    FEW WBC PRESENT, PREDOMINANTLY PMN NO ORGANISMS SEEN    Culture   Final    FEW Normal respiratory flora-no Staph aureus or Pseudomonas seen Performed at [facility], [address] [place], [place] [address]    Report Status 08/01/2022 FINAL  Final  MRSA Next Gen by PCR, Nasal  Status: None   Collection Time: 07/16/2022 11:03 PM   Specimen: Nasal Mucosa; Nasal Swab  Result Value Ref Range Status   MRSA by PCR Next Gen NOT DETECTED NOT  DETECTED Final    Comment: (NOTE) The GeneXpert MRSA Assay (FDA approved for NASAL specimens only), is one component of a comprehensive MRSA colonization surveillance program. It is not intended to diagnose MRSA infection nor to guide or monitor treatment for MRSA infections. Test performance is not FDA approved in patients less than 26 years old. Performed at [facility], [place] [address], [place], [place] [address]   Culture, blood (Routine X 2) w Reflex to ID Panel     Status: None   Collection Time: 07/28/2022 11:43 PM   Specimen: BLOOD  Result Value Ref Range Status   Specimen Description BLOOD BLOOD LEFT HAND  Final   Special Requests   Final    BOTTLES DRAWN AEROBIC ONLY Blood Culture adequate volume   Culture   Final    NO GROWTH 5 DAYS Performed at [facility], [place] [address], [place], [place] [address]    Report Status 08/04/2022 FINAL  Final  Culture, blood (Routine X 2) w Reflex to ID Panel     Status: None   Collection Time: 07/30/22 12:30 AM   Specimen: BLOOD  Result Value Ref Range Status   Specimen Description BLOOD SITE NOT SPECIFIED  Final   Special Requests   Final    BOTTLES DRAWN AEROBIC AND ANAEROBIC Blood Culture results may not be optimal due to an excessive volume of blood received in culture bottles   Culture   Final    NO GROWTH 5 DAYS Performed at [facility], [place] [address] [place], [place] [address]    Report Status 08/04/2022 FINAL  Final  Respiratory (~20 pathogens) panel by PCR     Status: None   Collection Time: 07/30/22  1:27 AM   Specimen: Nasopharyngeal Swab; Respiratory  Result Value Ref Range Status   Adenovirus NOT DETECTED NOT DETECTED Final   Coronavirus 229E NOT DETECTED NOT DETECTED Final    Comment: (NOTE) The Coronavirus on the Respiratory Panel, DOES NOT test for the novel  Coronavirus (2019 nCoV)    Coronavirus HKU1 NOT DETECTED NOT DETECTED Final   Coronavirus NL63 NOT DETECTED NOT DETECTED Final    Coronavirus OC43 NOT DETECTED NOT DETECTED Final   Metapneumovirus NOT DETECTED NOT DETECTED Final   Rhinovirus / Enterovirus NOT DETECTED NOT DETECTED Final   Influenza A NOT DETECTED NOT DETECTED Final   Influenza B NOT DETECTED NOT DETECTED Final   Parainfluenza Virus 1 NOT DETECTED NOT DETECTED Final   Parainfluenza Virus 2 NOT DETECTED NOT DETECTED Final   Parainfluenza Virus 3 NOT DETECTED NOT DETECTED Final   Parainfluenza Virus 4 NOT DETECTED NOT DETECTED Final   Respiratory Syncytial Virus NOT DETECTED NOT DETECTED Final   Bordetella pertussis NOT DETECTED NOT DETECTED Final   Bordetella Parapertussis NOT DETECTED NOT DETECTED Final   Chlamydophila pneumoniae NOT DETECTED NOT DETECTED Final   Mycoplasma pneumoniae NOT DETECTED NOT DETECTED Final    Comment: Performed at [facility], [place] [address], [place], [place] [address]  Resp panel by RT-PCR (RSV, Flu A&B, Covid) Anterior Nasal Swab     Status: None   Collection Time: 07/30/22  1:27 AM   Specimen: Anterior Nasal Swab  Result Value Ref Range Status   SARS Coronavirus 2 by RT PCR NEGATIVE NEGATIVE  Final   Influenza A by PCR NEGATIVE NEGATIVE Final   Influenza B by PCR NEGATIVE NEGATIVE Final    Comment: (NOTE) The Xpert Xpress SARS-CoV-2/FLU/RSV plus assay is intended as an aid in the diagnosis of influenza from Nasopharyngeal swab specimens and should not be used as a sole basis for treatment. Nasal washings and aspirates are unacceptable for Xpert Xpress SARS-CoV-2/FLU/RSV testing.  Fact Sheet for Patients: EntrepreneurPulse.com.au  Fact Sheet for Healthcare Providers: IncredibleEmployment.be  This test is not yet approved or cleared by the [place] FDA and has been authorized for detection and/or diagnosis of SARS-CoV-2 by FDA under an Emergency Use Authorization (EUA). This EUA will remain in effect (meaning this test can be used) for the duration of the COVID-19  declaration under Section 564(b)(1) of the Act, 21 U.S.C. section 360bbb-3(b)(1), unless the authorization is terminated or revoked.     Resp Syncytial Virus by PCR NEGATIVE NEGATIVE Final    Comment: (NOTE) Fact Sheet for Patients: EntrepreneurPulse.com.au  Fact Sheet for Healthcare Providers: IncredibleEmployment.be  This test is not yet approved or cleared by the [place] FDA and has been authorized for detection and/or diagnosis of SARS-CoV-2 by FDA under an Emergency Use Authorization (EUA). This EUA will remain in effect (meaning this test can be used) for the duration of the COVID-19 declaration under Section 564(b)(1) of the Act, 21 U.S.C. section 360bbb-3(b)(1), unless the authorization is terminated or revoked.  Performed at [facility], [place] [address], [place], [place] [address]   Culture, Respiratory w Gram Stain     Status: None (Preliminary result)   Collection Time: 08/05/22 11:26 AM   Specimen: Tracheal Aspirate; Respiratory  Result Value Ref Range Status   Specimen Description TRACHEAL ASPIRATE  Final   Special Requests Normal  Final   Gram Stain   Final    RARE SQUAMOUS EPITHELIAL CELLS PRESENT RARE WBC PRESENT, PREDOMINANTLY MONONUCLEAR NO ORGANISMS SEEN    Culture   Final    CULTURE REINCUBATED FOR BETTER GROWTH Performed at [facility], [place] [address], [place], [place] [address]    Report Status PENDING  Incomplete    Lab Basic Metabolic Panel: Recent Labs  Lab 08/01/22 0448 08/02/22 0041 08/02/22 0500 08/02/22 1134 08/03/22 0421 08/03/22 0620 08/04/22 0359 08/04/22 1255 08/04/22 1818 08/04/22 2106 08/05/22 0635 08/05/22 1041 08/05/22 1321  NA 137   < > 147*   < > 154*   < > 159*   < > 154* 158* 155* 159* 160*  K 4.4  --  4.1  --  4.1  --  4.1  --   --   --  4.1  --   --   CL 102  --  109  --  119*  --  121*  --   --   --  118*  --   --   CO2 25  --  29  --  28  --  27  --   --    --  27  --   --   GLUCOSE 247*  --  260*  --  204*  --  240*  --   --   --  266*  --   --   BUN 30*  --  38*  --  34*  --  40*  --   --   --  42*  --   --   CREATININE 1.65*  --  1.57*  --  1.29*  --  1.63*  --   --   --  1.73*  --   --   CALCIUM 7.9*  --  8.3*  --  8.3*  --  8.6*  --   --   --  8.4*  --   --   MG 2.4  --  2.3  --  2.4  --   --   --   --   --   --   --   --    < > = values in this interval not displayed.   Liver Function Tests: Recent Labs  Lab 08/01/22 0448 08/02/22 0500 08/03/22 0421 08/04/22 0359 08/05/22 0635  AST 158* 103* 85* 75* 63*  ALT 205* 138* 100* 80* 57*  ALKPHOS 67 71 74 82 70  BILITOT 1.2 1.5* 1.1 1.3* 1.0  PROT 5.4* 5.8* 5.8* 6.2* 6.0*  ALBUMIN 2.3* 2.4* 2.3* 2.4* 2.0*   No results for input(s): "LIPASE", "AMYLASE" in the last 168 hours. No results for input(s): "AMMONIA" in the last 168 hours. CBC: Recent Labs  Lab 08/01/22 0448 08/02/22 0500 08/03/22 0421 08/04/22 0359 08/05/22 0635  WBC 14.7* 16.2* 13.8* 12.9* 8.1  HGB 12.5* 11.5* 11.1* 11.0* 8.8*  HCT 36.7* 35.6* 36.6* 36.3* 30.8*  MCV 95.8 99.7 105.2* 106.8* 111.2*  PLT 121* 135* 155 186 202   Cardiac Enzymes: No results for input(s): "CKTOTAL", "CKMB", "CKMBINDEX", "TROPONINI" in the last 168 hours. Sepsis Labs: Recent Labs  Lab 08/01/22 0448 08/02/22 0500 08/03/22 0421 08/04/22 0359 08/05/22 0635  WBC 14.7* 16.2* 13.8* 12.9* 8.1  LATICACIDVEN 1.7  --   --   --   --      Kaysha Parsell [name] 09/02/22, 10:16 AM

## 2022-09-06 NOTE — Progress Notes (Signed)
Patient's wife Bobby Obrien would like to be contacted first for any updates or information via cell phone [phone] or email: [email or web address]  [name]

## 2022-09-06 DEATH — deceased
